# Patient Record
Sex: Male | Born: 1965 | Race: White | Hispanic: No | Smoking: Never smoker
Health system: Southern US, Community
[De-identification: ages and names within clinical notes are randomized; demographics above are authoritative.]

## PROBLEM LIST (undated history)

## (undated) DIAGNOSIS — D649 Anemia, unspecified: Secondary | ICD-10-CM

## (undated) DIAGNOSIS — G4700 Insomnia, unspecified: Secondary | ICD-10-CM

## (undated) DIAGNOSIS — I10 Essential (primary) hypertension: Secondary | ICD-10-CM

## (undated) DIAGNOSIS — R578 Other shock: Secondary | ICD-10-CM

## (undated) DIAGNOSIS — I2510 Atherosclerotic heart disease of native coronary artery without angina pectoris: Secondary | ICD-10-CM

## (undated) DIAGNOSIS — Z5189 Encounter for other specified aftercare: Secondary | ICD-10-CM

## (undated) DIAGNOSIS — K279 Peptic ulcer, site unspecified, unspecified as acute or chronic, without hemorrhage or perforation: Secondary | ICD-10-CM

## (undated) DIAGNOSIS — M4802 Spinal stenosis, cervical region: Secondary | ICD-10-CM

## (undated) DIAGNOSIS — K644 Residual hemorrhoidal skin tags: Secondary | ICD-10-CM

## (undated) DIAGNOSIS — K219 Gastro-esophageal reflux disease without esophagitis: Secondary | ICD-10-CM

## (undated) DIAGNOSIS — G4733 Obstructive sleep apnea (adult) (pediatric): Secondary | ICD-10-CM

## (undated) DIAGNOSIS — K2270 Barrett's esophagus without dysplasia: Secondary | ICD-10-CM

## (undated) DIAGNOSIS — G8929 Other chronic pain: Secondary | ICD-10-CM

## (undated) DIAGNOSIS — D539 Nutritional anemia, unspecified: Secondary | ICD-10-CM

## (undated) DIAGNOSIS — E785 Hyperlipidemia, unspecified: Secondary | ICD-10-CM

## (undated) DIAGNOSIS — G2581 Restless legs syndrome: Secondary | ICD-10-CM

## (undated) DIAGNOSIS — R0902 Hypoxemia: Secondary | ICD-10-CM

## (undated) DIAGNOSIS — J189 Pneumonia, unspecified organism: Secondary | ICD-10-CM

## (undated) DIAGNOSIS — K259 Gastric ulcer, unspecified as acute or chronic, without hemorrhage or perforation: Secondary | ICD-10-CM

## (undated) DIAGNOSIS — K5790 Diverticulosis of intestine, part unspecified, without perforation or abscess without bleeding: Secondary | ICD-10-CM

## (undated) DIAGNOSIS — K922 Gastrointestinal hemorrhage, unspecified: Secondary | ICD-10-CM

## (undated) DIAGNOSIS — R0683 Snoring: Secondary | ICD-10-CM

## (undated) DIAGNOSIS — M549 Dorsalgia, unspecified: Secondary | ICD-10-CM

## (undated) HISTORY — DX: Snoring: R06.83

## (undated) HISTORY — DX: Diverticulosis of intestine, part unspecified, without perforation or abscess without bleeding: K57.90

## (undated) HISTORY — DX: Hypoxemia: R09.02

## (undated) HISTORY — PX: CARDIAC CATHETERIZATION: SHX172

## (undated) HISTORY — DX: Anemia, unspecified: D64.9

## (undated) HISTORY — DX: Gastro-esophageal reflux disease without esophagitis: K21.9

## (undated) HISTORY — DX: Encounter for other specified aftercare: Z51.89

## (undated) HISTORY — DX: Residual hemorrhoidal skin tags: K64.4

## (undated) HISTORY — DX: Insomnia, unspecified: G47.00

## (undated) HISTORY — DX: Obstructive sleep apnea (adult) (pediatric): G47.33

## (undated) HISTORY — DX: Nutritional anemia, unspecified: D53.9

## (undated) HISTORY — DX: Hyperlipidemia, unspecified: E78.5

## (undated) HISTORY — DX: Restless legs syndrome: G25.81

---

## 1999-03-26 ENCOUNTER — Ambulatory Visit (HOSPITAL_COMMUNITY): Admission: RE | Admit: 1999-03-26 | Discharge: 1999-03-26 | Payer: Self-pay | Admitting: Orthopedic Surgery

## 1999-03-26 ENCOUNTER — Encounter: Payer: Self-pay | Admitting: Orthopedic Surgery

## 1999-04-09 ENCOUNTER — Ambulatory Visit (HOSPITAL_COMMUNITY): Admission: RE | Admit: 1999-04-09 | Discharge: 1999-04-09 | Payer: Self-pay | Admitting: Orthopedic Surgery

## 1999-04-09 ENCOUNTER — Encounter: Payer: Self-pay | Admitting: Orthopedic Surgery

## 1999-10-09 ENCOUNTER — Encounter: Payer: Self-pay | Admitting: Emergency Medicine

## 1999-10-09 ENCOUNTER — Emergency Department (HOSPITAL_COMMUNITY): Admission: EM | Admit: 1999-10-09 | Discharge: 1999-10-09 | Payer: Self-pay | Admitting: Emergency Medicine

## 2002-04-26 ENCOUNTER — Encounter: Payer: Self-pay | Admitting: Emergency Medicine

## 2002-04-26 ENCOUNTER — Inpatient Hospital Stay (HOSPITAL_COMMUNITY): Admission: EM | Admit: 2002-04-26 | Discharge: 2002-04-27 | Payer: Self-pay | Admitting: Emergency Medicine

## 2002-04-27 ENCOUNTER — Encounter: Payer: Self-pay | Admitting: Internal Medicine

## 2002-08-27 ENCOUNTER — Ambulatory Visit (HOSPITAL_COMMUNITY): Admission: RE | Admit: 2002-08-27 | Discharge: 2002-08-27 | Payer: Self-pay | Admitting: *Deleted

## 2003-01-05 ENCOUNTER — Ambulatory Visit (HOSPITAL_COMMUNITY): Admission: RE | Admit: 2003-01-05 | Discharge: 2003-01-06 | Payer: Self-pay | Admitting: Orthopaedic Surgery

## 2003-02-08 ENCOUNTER — Encounter: Admission: RE | Admit: 2003-02-08 | Discharge: 2003-03-22 | Payer: Self-pay | Admitting: Orthopaedic Surgery

## 2005-01-27 ENCOUNTER — Emergency Department (HOSPITAL_COMMUNITY): Admission: EM | Admit: 2005-01-27 | Discharge: 2005-01-27 | Payer: Self-pay | Admitting: Emergency Medicine

## 2005-09-20 ENCOUNTER — Emergency Department (HOSPITAL_COMMUNITY): Admission: EM | Admit: 2005-09-20 | Discharge: 2005-09-20 | Payer: Self-pay | Admitting: Emergency Medicine

## 2006-03-22 ENCOUNTER — Emergency Department (HOSPITAL_COMMUNITY): Admission: EM | Admit: 2006-03-22 | Discharge: 2006-03-22 | Payer: Self-pay | Admitting: Emergency Medicine

## 2006-03-30 ENCOUNTER — Emergency Department (HOSPITAL_COMMUNITY): Admission: EM | Admit: 2006-03-30 | Discharge: 2006-03-30 | Payer: Self-pay | Admitting: Emergency Medicine

## 2006-11-19 ENCOUNTER — Emergency Department (HOSPITAL_COMMUNITY): Admission: EM | Admit: 2006-11-19 | Discharge: 2006-11-19 | Payer: Self-pay | Admitting: Emergency Medicine

## 2006-12-06 ENCOUNTER — Emergency Department (HOSPITAL_COMMUNITY): Admission: EM | Admit: 2006-12-06 | Discharge: 2006-12-06 | Payer: Self-pay | Admitting: Emergency Medicine

## 2007-02-19 ENCOUNTER — Emergency Department (HOSPITAL_COMMUNITY): Admission: EM | Admit: 2007-02-19 | Discharge: 2007-02-19 | Payer: Self-pay | Admitting: Emergency Medicine

## 2007-02-21 ENCOUNTER — Emergency Department (HOSPITAL_COMMUNITY): Admission: EM | Admit: 2007-02-21 | Discharge: 2007-02-21 | Payer: Self-pay | Admitting: Emergency Medicine

## 2007-02-28 ENCOUNTER — Emergency Department (HOSPITAL_COMMUNITY): Admission: EM | Admit: 2007-02-28 | Discharge: 2007-02-28 | Payer: Self-pay | Admitting: Emergency Medicine

## 2008-01-17 ENCOUNTER — Emergency Department (HOSPITAL_COMMUNITY): Admission: EM | Admit: 2008-01-17 | Discharge: 2008-01-17 | Payer: Self-pay | Admitting: Emergency Medicine

## 2008-03-25 ENCOUNTER — Encounter: Admission: RE | Admit: 2008-03-25 | Discharge: 2008-03-25 | Payer: Self-pay | Admitting: Orthopedic Surgery

## 2008-04-06 ENCOUNTER — Encounter: Admission: RE | Admit: 2008-04-06 | Discharge: 2008-04-26 | Payer: Self-pay | Admitting: Orthopedic Surgery

## 2008-12-01 ENCOUNTER — Emergency Department (HOSPITAL_COMMUNITY): Admission: EM | Admit: 2008-12-01 | Discharge: 2008-12-01 | Payer: Self-pay | Admitting: Emergency Medicine

## 2009-05-19 ENCOUNTER — Encounter: Admission: RE | Admit: 2009-05-19 | Discharge: 2009-05-19 | Payer: Self-pay | Admitting: Family Medicine

## 2009-06-23 ENCOUNTER — Encounter: Admission: RE | Admit: 2009-06-23 | Discharge: 2009-06-23 | Payer: Self-pay | Admitting: Neurosurgery

## 2010-02-05 ENCOUNTER — Inpatient Hospital Stay (HOSPITAL_COMMUNITY): Admission: RE | Admit: 2010-02-05 | Discharge: 2010-02-07 | Payer: Self-pay | Admitting: Neurosurgery

## 2010-03-06 ENCOUNTER — Encounter: Admission: RE | Admit: 2010-03-06 | Discharge: 2010-03-06 | Payer: Self-pay | Admitting: Neurosurgery

## 2010-03-19 ENCOUNTER — Emergency Department (HOSPITAL_COMMUNITY): Admission: EM | Admit: 2010-03-19 | Discharge: 2010-03-19 | Payer: Self-pay | Admitting: Emergency Medicine

## 2010-06-12 ENCOUNTER — Encounter: Admission: RE | Admit: 2010-06-12 | Discharge: 2010-06-12 | Payer: Self-pay | Admitting: Neurosurgery

## 2010-07-18 ENCOUNTER — Emergency Department (HOSPITAL_COMMUNITY): Admission: EM | Admit: 2010-07-18 | Discharge: 2010-07-18 | Payer: Self-pay | Admitting: Emergency Medicine

## 2011-01-14 LAB — URINE CULTURE
Colony Count: NO GROWTH
Culture: NO GROWTH

## 2011-01-14 LAB — DIFFERENTIAL
Basophils Absolute: 0 10*3/uL (ref 0.0–0.1)
Basophils Relative: 1 % (ref 0–1)
Eosinophils Absolute: 0.1 10*3/uL (ref 0.0–0.7)
Eosinophils Relative: 3 % (ref 0–5)
Lymphocytes Relative: 31 % (ref 12–46)
Lymphs Abs: 1.2 10*3/uL (ref 0.7–4.0)
Monocytes Absolute: 0.2 10*3/uL (ref 0.1–1.0)
Monocytes Relative: 6 % (ref 3–12)
Neutro Abs: 2.4 10*3/uL (ref 1.7–7.7)
Neutrophils Relative %: 60 % (ref 43–77)

## 2011-01-14 LAB — CBC
HCT: 34.8 % — ABNORMAL LOW (ref 39.0–52.0)
Hemoglobin: 11.8 g/dL — ABNORMAL LOW (ref 13.0–17.0)
MCHC: 34 g/dL (ref 30.0–36.0)
MCV: 88.9 fL (ref 78.0–100.0)
Platelets: 176 10*3/uL (ref 150–400)
RBC: 3.91 MIL/uL — ABNORMAL LOW (ref 4.22–5.81)
RDW: 12.6 % (ref 11.5–15.5)
WBC: 4 10*3/uL (ref 4.0–10.5)

## 2011-01-14 LAB — BASIC METABOLIC PANEL
BUN: 16 mg/dL (ref 6–23)
CO2: 24 mEq/L (ref 19–32)
Calcium: 8.9 mg/dL (ref 8.4–10.5)
Chloride: 105 mEq/L (ref 96–112)
Creatinine, Ser: 0.84 mg/dL (ref 0.4–1.5)
GFR calc Af Amer: >60 mL/min (ref >60)
GFR calc non Af Amer: >60 mL/min (ref >60)
Glucose, Bld: 98 mg/dL (ref 70–99)
Potassium: 3.8 mEq/L (ref 3.5–5.1)
Sodium: 138 mEq/L (ref 135–145)

## 2011-01-14 LAB — URINALYSIS, ROUTINE W REFLEX MICROSCOPIC
Bilirubin Urine: NEGATIVE
Glucose, UA: NEGATIVE mg/dL
Hgb urine dipstick: NEGATIVE
Ketones, ur: NEGATIVE mg/dL
Nitrite: NEGATIVE
Protein, ur: NEGATIVE mg/dL
Specific Gravity, Urine: 1.017 (ref 1.005–1.030)
Urobilinogen, UA: 0.2 mg/dL (ref 0.0–1.0)
pH: 5.5 (ref 5.0–8.0)

## 2011-01-16 LAB — DIFFERENTIAL
Basophils Absolute: 0.1 10*3/uL (ref 0.0–0.1)
Basophils Relative: 2 % — ABNORMAL HIGH (ref 0–1)
Eosinophils Absolute: 0.1 10*3/uL (ref 0.0–0.7)
Eosinophils Relative: 2 % (ref 0–5)
Lymphocytes Relative: 40 % (ref 12–46)
Lymphs Abs: 1.3 10*3/uL (ref 0.7–4.0)
Monocytes Absolute: 0.2 10*3/uL (ref 0.1–1.0)
Monocytes Relative: 7 % (ref 3–12)
Neutro Abs: 1.5 10*3/uL — ABNORMAL LOW (ref 1.7–7.7)
Neutrophils Relative %: 49 % (ref 43–77)

## 2011-01-16 LAB — BASIC METABOLIC PANEL
BUN: 15 mg/dL (ref 6–23)
CO2: 28 mEq/L (ref 19–32)
Calcium: 9.8 mg/dL (ref 8.4–10.5)
Chloride: 102 mEq/L (ref 96–112)
Creatinine, Ser: 0.94 mg/dL (ref 0.4–1.5)
GFR calc Af Amer: >60 mL/min (ref >60)
GFR calc non Af Amer: >60 mL/min (ref >60)
Glucose, Bld: 112 mg/dL — ABNORMAL HIGH (ref 70–99)
Potassium: 4.6 mEq/L (ref 3.5–5.1)
Sodium: 136 mEq/L (ref 135–145)

## 2011-01-16 LAB — ABO/RH: ABO/RH(D): O POS

## 2011-01-16 LAB — SURGICAL PCR SCREEN
MRSA, PCR: NEGATIVE
Staphylococcus aureus: NEGATIVE

## 2011-01-16 LAB — CBC
HCT: 45.5 % (ref 39.0–52.0)
Hemoglobin: 15.8 g/dL (ref 13.0–17.0)
MCHC: 34.7 g/dL (ref 30.0–36.0)
MCV: 90.1 fL (ref 78.0–100.0)
Platelets: 147 10*3/uL — ABNORMAL LOW (ref 150–400)
RBC: 5.05 MIL/uL (ref 4.22–5.81)
RDW: 12.2 % (ref 11.5–15.5)
WBC: 3.1 10*3/uL — ABNORMAL LOW (ref 4.0–10.5)

## 2011-01-16 LAB — TYPE AND SCREEN
ABO/RH(D): O POS
Antibody Screen: NEGATIVE

## 2011-03-15 NOTE — Op Note (Signed)
NAME:  Joseph Fisher, Joseph Fisher                         ACCOUNT NO.:  [id number]   MEDICAL RECORD NO.:  [id number]                   PATIENT TYPE:  OIB   LOCATION:  3003                                 FACILITY:  [facility]   PHYSICIAN:  Mark C. Ophelia Charter, M.D.                 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   OPERATION/PROCEDURE:  L4-5 laminotomy, foraminotomy and microdiskectomy with  removal of free fragment, right foraminotomy and microdiskectomy.   ASSISTANT:  Genene Churn. Barry Dienes, P.A.-C.   ANESTHESIA:  COT.   COMPLICATIONS:  None.   DESCRIPTION OF PROCEDURE:  After the induction of general anesthesia with  preoperative Ancef prophylaxis, the patient was placed in the kneeling  position on the [place] frame with careful padding and positioning.  The  back was prepped with Duraprep, scrubbed with towels.  A sterile skin mark  was used at the old incision.  Betadine Vi-drape was applied.  The old  incision was opened and spinal needle was placed at the L4-5 disk space  level, confirmed by the cross table lateral x-ray.   The inferior aspect of the lamina was identified and a small cuff curet was  used to remove the soft tissue off the inferior aspect of the lamina and  partial laminectomy was performed on the left side, leaving a small portion  of the lamina intact at only the upper one-third.  This got up to a normal  level of ligament that had not been exposed at the first procedure several  years ago.  The foramina was enlarged and there was extensive scarring of  the dura in the lateral gutter and of the nerve root as it entered the  foramina.  Attempts at mobilizing the nerve  shows that it was extremely  difficult and initial mobilization was performed at the superior aspect once  the ligament was removed and gently peeled off.  A dural separator was used  anterior to the dura and nerve root with gentle microdissection using the  operating microscope.  The dura was full at the level of the disk where the  patient had a large free fragment and there was extensive scarring  consistent with probably a previous rupture and then more recent rerupture  and increase in size with added compression.  The patient had spinal  stenosis by MRI measurements due to the large size of the fragment.  With  difficulty mobilizing and reaching the fragment, the inferior aspect of the  spinous process was removed and the ligaments were removed at the midline  and over on the right side.  The bone was removed out  to the level of the  pedicle to allow additional room and there were some hypertrophic ligament  in the right gutter as well and the foramina was enlarged.  The level of the  disk was identified on the right level and the disk was collapsed,  corresponding with the x-ray which showed only a 1 to 2 mm disk space.  This  was in line with the patient's large disk herniation and significant disk  disease.  With mobilization of the right side, the microscope was then re-  orientated back on the left again and now the dura was able to be gently  mobilized and several large fragments of disk were caught with the  micropituitary, gently teased out and separated from the dura.  A hockey  stick was placed on the right side and some free fragments were pushed over  to the left side and the fragments were removed.  Once all fragments were  removed, the hockey stick could be passed through 180 degree sweep from the  right as well as from the left and using the Derrico retractor on the  opposite side, the hockey stick was able to be visualized and there was no  compression anterior to the  dura and the hockey stick would made an easy 180  degree sweep.  The patient did have some mild endplate spurring at the L4-5  level but there were no fragments and the portion of the endplate spurring  was nibbled off from the right and left.  Facets were intact.  The foramina  were enlarged and after irrigation with saline solution, several passes were  made with the micropituitary from the right and left side with essentially  no disk nucleus material removed.  The defect in the annulus was visualized  on the left paracentral region where the disk had herniated.  Up and down  micropituitaries were used but essentially there was minimal disk material  left and after irrigation with saline solution, the fascia was closed with 0  Vicryl, 2-0 Vicryl in the subcutaneous tissue, skin staple closure,  postoperative dressing.  Postoperative exam in the recovery room after  extubation demonstrated he had good relief of his preoperative leg pain,  return of sensation and normal motor exam.  The patient did have complaints  of back incisional pain as expected.  The instrument count and needle count  was correct.                                                Mark C. Ophelia Charter, M.D.    MCY/MEDQ  D:  01/05/2003  T:  01/06/2003  Job:  [id number]

## 2011-03-15 NOTE — Discharge Summary (Signed)
[place]. [facility]  Patient:    Joseph Fisher, Joseph Fisher Visit Number: [id number] MRN: [id number]          Service Type: MED Location: 1800 [id number] [id number] Attending Physician:  Ilene Qua Dictated by:   Dietrich Pates, M.D., [facility], [facility] Admit Date:  04/26/2002                             Discharge Summary  IDENTIFICATION:  The patient is a 45 year old with no history of CAD.  Over the past few weeks, he has noted episodic chest pressure.  He describes it as pain substernally and radiates other places across his chest, accompanied by some shortness of breath, not associated with any activity.  He actually says he will walk about a mile and one-half a day for exercise and notes no problem with this, maybe slight increase in shortness of breath.  He notes some occasional tight with food by not consistent.  Today, he woke up, had chest pressure, became nauseated, vomited.  Some improvement but still had chest pressure.  He came to the emergency room, and about 30 minutes after arriving said symptoms improved.  No intervention.  The patient denies any change in his bowel habits.  No black tarry stools.  No hematemesis.  Does note burning acid taste in his mouth with awakening at times, did have that this morning.  MEDICATIONS:  Advil and Pepcid.  ALLERGIES:  None.  PAST MEDICAL HISTORY:  Negative.  PAST SURGICAL HISTORY:  Status post back surgery.  SOCIAL HISTORY:  The patient is married.  He is a [employer].  He does not smoke, does not drink for past seven months.  FAMILY HISTORY:  Father died of an MI at age 75.  REVIEW OF SYSTEMS:  No fevers or chills.  Cardiac: As noted.  GI: As noted. GU: Negative.  Endocrine: No history of diabetes.  Lipids unknown.  PHYSICAL EXAMINATION:  GENERAL:  Patient in no acute distress.  VITAL SIGNS:  Blood pressure 145/90, pulse 80.  Weight not taken.  Temperature 98.  NECK:  JVP is normal, no bruits.  LUNGS:   Clear.  CARDIAC:  Regular rate and rhythm.  Normal S1 and S2.  No S3, S4, or murmur.  CHEST:  Nontender.  ABDOMEN:  Benign.  EXTREMITIES:  There are 2+ pulses.  No lower extremity edema.  DIAGNOSTIC DATA:  A 12-lead EKG shows normal sinus rhythm at rate of 68 beats per minute.  CK 199, MB 6.1, troponin 0.01.  IMPRESSION:  The patient is a 45 year old gentleman with chest pain which appears to be atypical for cardiac.  Still, he notes some shortness of breath.  The pain was worse this morning but unlikely.  Would go ahead and rule out. Treat with proton pump inhibitor.  Check lipids.  Plan, if he rules out, for stress Cardiolite in the morning. Dictated by:   Dietrich Pates, M.D., [facility], [facility] Attending Physician:  Ilene Qua DD:  04/26/02 TD:  04/26/02 Job: [id number] XL/[id number]

## 2011-03-15 NOTE — Op Note (Signed)
NAME:  Joseph Fisher, Joseph Fisher                         ACCOUNT NO.:  [id number]   MEDICAL RECORD NO.:  [id number]                   PATIENT TYPE:  AMB   LOCATION:  ENDO                                 FACILITY:  [facility]   PHYSICIAN:  Sharyn Dross., M.D.               DATE OF BIRTH:  02/25/1966   DATE OF PROCEDURE:  DATE OF DISCHARGE:                                 OPERATIVE REPORT   REFERRING PHYSICIAN:  Dortha Kern, M.D.   ENDOSCOPIST:  Dortha Kern, M.D.   INSTRUMENT USED:  Olympus video panendoscope.   SUBJECTIVE:  This 45 year old gentleman has been known to me for the last  month.  He has been relatively stable and evaluated for gastroesophageal  reflux disease possibly secondary to NSAID use.  He recently returned back  to the office where he was noted to have been experiencing blood per rectum  that was ongoing.  There was no associated pain or discomforts with process  that was present.  He denies any history of abdominal pains, nor any history  of any inflammatory bowel disease that was present.  He states that the  occurrence of blood has been noted twice up until his last visit  approximately a week ago.  He came in to the office for evaluation of this.   OBJECTIVE FINDINGS:  The patient is a pleasant gentleman who appears to be  in no distress.  The vital signs appear to be stable.  The HEENT examination  was anicteric.  The neck was supple.  Lungs were clear to auscultation and  percussion.  The heart had a regular rate and rhythm without heaves,  thrills, murmurs or gallops.  The abdomen was soft.  There was no evidence  of tenderness or any hepatosplenomegaly that was noted.  Digital rectal exam  was normal.  The extremities showed no cyanosis, clubbing or edema.   GROSS IMPRESSION:  Blood in stools,  rule out possibly related to  hemorrhoids, rule out inflammatory bowel disease, rule out other causes at  this time.   PRESENT RECOMMENDATIONS:  Presently, I am going to  proceed with an  colonoscopic examination to evaluate this process that is ongoing with him  at this time, depending on these results will determine the course of  therapy.   INFORMED CONSENT:  The patient was advised of the procedures, the  indications and the risks involved.  He has viewed the video at this time.  A consent form was obtained.   PREOPERATIVE PREPARATION:  The patient was brought into the endoscopy unit  at [facility] where the IV for intravenous sedating medication was  started.  A monitor was placed on the patient to monitor the patient's vital  signs and oxygen saturations.  Nasal oxygen at approximately 2L per minute  was used and after adequate sedation was performed, the procedure was begun.   DESCRIPTION OF PROCEDURE:  The  instrument was advanced with the patient  lying in the left lateral position via direct technique without difficulty  to approximately 90 cm proximal colon to the cecum.  This is confirmed by  palpation, transillumination as well as visualization of the ileocecal  valve.   There appeared to be no gross abnormalities such as masses, polyps, or  stricture lesions appreciated.  The vascular pattern appeared to be well  within normal limits throughout the entire colon.   However, the mucosal pattern showed evidence of scattered diverticula that  was present in the descending colon.  The diverticula appeared to be small  to moderate in size without any surrounding inflammation or bleeding noted.  There was no increased tortuosity of the colon that was noted.   The instrument was retroflexed in the rectal area for visualization of the  anal verge.  Again there was no evidence of any inflammatory changes or  internal hemorrhoids that was appreciated.  The instrument was gradually  straightened out and retracted back.  This area upon exiting from the area  showed evidence of scattered, slightly increased vascularity of the external  anal  verge at this time.  There was no evidence of any active bleeding that  was noted.  The instrument was removed per rectum from this.  The patient  tolerated the procedure well.   TREATMENT:  I am going to recommend ProctoCream with hydrocortisone to be  applied to the anus at this time q.h.s.  He is to follow up with me in the  next two to three weeks for re-evaluation depending on these results will  determine the course of therapy.                                               Sharyn Dross., M.D.    JM/MEDQ  D:  08/27/2002  T:  08/27/2002  Job:  [id number]

## 2011-08-30 ENCOUNTER — Emergency Department (HOSPITAL_COMMUNITY): Payer: Self-pay

## 2011-08-30 ENCOUNTER — Emergency Department (HOSPITAL_COMMUNITY)
Admission: EM | Admit: 2011-08-30 | Discharge: 2011-08-31 | Disposition: A | Discharge status: Home / Self Care | Payer: Self-pay | Attending: Emergency Medicine | Admitting: Emergency Medicine

## 2011-08-30 DIAGNOSIS — I10 Essential (primary) hypertension: Secondary | ICD-10-CM | POA: Insufficient documentation

## 2011-08-30 DIAGNOSIS — J069 Acute upper respiratory infection, unspecified: Secondary | ICD-10-CM | POA: Insufficient documentation

## 2011-08-30 DIAGNOSIS — R509 Fever, unspecified: Secondary | ICD-10-CM | POA: Insufficient documentation

## 2011-08-30 DIAGNOSIS — R05 Cough: Secondary | ICD-10-CM | POA: Insufficient documentation

## 2011-08-30 DIAGNOSIS — J3489 Other specified disorders of nose and nasal sinuses: Secondary | ICD-10-CM | POA: Insufficient documentation

## 2011-08-30 DIAGNOSIS — R269 Unspecified abnormalities of gait and mobility: Secondary | ICD-10-CM | POA: Insufficient documentation

## 2011-08-30 DIAGNOSIS — R0989 Other specified symptoms and signs involving the circulatory and respiratory systems: Secondary | ICD-10-CM | POA: Insufficient documentation

## 2011-08-30 DIAGNOSIS — R42 Dizziness and giddiness: Secondary | ICD-10-CM | POA: Insufficient documentation

## 2011-08-30 DIAGNOSIS — R0609 Other forms of dyspnea: Secondary | ICD-10-CM | POA: Insufficient documentation

## 2011-08-30 DIAGNOSIS — R059 Cough, unspecified: Secondary | ICD-10-CM | POA: Insufficient documentation

## 2011-08-30 LAB — DIFFERENTIAL
Basophils Absolute: 0 10*3/uL (ref 0.0–0.1)
Basophils Relative: 0 % (ref 0–1)
Eosinophils Absolute: 0.1 10*3/uL (ref 0.0–0.7)
Eosinophils Relative: 2 % (ref 0–5)
Lymphocytes Relative: 32 % (ref 12–46)
Lymphs Abs: 1.6 10*3/uL (ref 0.7–4.0)
Monocytes Absolute: 0.3 10*3/uL (ref 0.1–1.0)
Monocytes Relative: 5 % (ref 3–12)
Neutro Abs: 3 10*3/uL (ref 1.7–7.7)
Neutrophils Relative %: 60 % (ref 43–77)

## 2011-08-30 LAB — CBC
HCT: 41.8 % (ref 39.0–52.0)
Hemoglobin: 14.1 g/dL (ref 13.0–17.0)
MCH: 30.5 pg (ref 26.0–34.0)
MCHC: 33.7 g/dL (ref 30.0–36.0)
MCV: 90.5 fL (ref 78.0–100.0)
Platelets: 153 10*3/uL (ref 150–400)
RBC: 4.62 MIL/uL (ref 4.22–5.81)
RDW: 12.7 % (ref 11.5–15.5)
WBC: 5 10*3/uL (ref 4.0–10.5)

## 2011-08-30 LAB — POCT I-STAT, CHEM 8
BUN: 12 mg/dL (ref 6–23)
Calcium, Ion: 1.16 mmol/L (ref 1.12–1.32)
Chloride: 105 mEq/L (ref 96–112)
Creatinine, Ser: 1.3 mg/dL (ref 0.50–1.35)
Glucose, Bld: 141 mg/dL — ABNORMAL HIGH (ref 70–99)
HCT: 43 % (ref 39.0–52.0)
Hemoglobin: 14.6 g/dL (ref 13.0–17.0)
Potassium: 3.2 mEq/L — ABNORMAL LOW (ref 3.5–5.1)
Sodium: 139 mEq/L (ref 135–145)
TCO2: 23 mmol/L (ref 0–100)

## 2011-08-30 MED ORDER — GADOBENATE DIMEGLUMINE 529 MG/ML IV SOLN
20.0000 mL | Freq: Once | INTRAVENOUS | Status: AC | PRN
  Administered 2011-08-30: 20 mL via INTRAVENOUS

## 2011-08-31 MED ORDER — IOHEXOL 350 MG/ML SOLN
80.0000 mL | Freq: Once | INTRAVENOUS | Status: AC | PRN
  Administered 2011-08-31: 80 mL via INTRAVENOUS

## 2011-09-14 ENCOUNTER — Encounter: Payer: Self-pay | Admitting: Emergency Medicine

## 2011-09-14 ENCOUNTER — Emergency Department (HOSPITAL_COMMUNITY)
Admission: EM | Admit: 2011-09-14 | Discharge: 2011-09-14 | Discharge status: Against Medical Advice | Payer: Self-pay | Attending: Emergency Medicine | Admitting: Emergency Medicine

## 2011-09-14 DIAGNOSIS — R079 Chest pain, unspecified: Secondary | ICD-10-CM | POA: Insufficient documentation

## 2011-09-14 DIAGNOSIS — R05 Cough: Secondary | ICD-10-CM

## 2011-09-14 DIAGNOSIS — R109 Unspecified abdominal pain: Secondary | ICD-10-CM | POA: Insufficient documentation

## 2011-09-14 HISTORY — DX: Essential (primary) hypertension: I10

## 2011-09-14 NOTE — ED Notes (Addendum)
Pt presented to the Er with c/o right side and and rib cage pain, 8/10, started about 3-4 weeks ago, pt states he was seen in th ER for the same symptoms, pt given steroids and cough syrup, however reports no relief in symptoms. Pain constant, sharp, increase upon movement, at times feels like a knife stabbing. Pt also states that when palpated to the right area, pain noted as well.Pt also seen by his PCP.

## 2011-09-17 ENCOUNTER — Other Ambulatory Visit: Payer: Self-pay

## 2011-09-17 ENCOUNTER — Emergency Department (HOSPITAL_COMMUNITY): Payer: Self-pay

## 2011-09-17 ENCOUNTER — Encounter (HOSPITAL_COMMUNITY): Payer: Self-pay | Admitting: *Deleted

## 2011-09-17 ENCOUNTER — Emergency Department (HOSPITAL_COMMUNITY)
Admission: EM | Admit: 2011-09-17 | Discharge: 2011-09-17 | Disposition: A | Discharge status: Home / Self Care | Payer: Self-pay | Attending: Emergency Medicine | Admitting: Emergency Medicine

## 2011-09-17 DIAGNOSIS — R05 Cough: Secondary | ICD-10-CM | POA: Insufficient documentation

## 2011-09-17 DIAGNOSIS — R059 Cough, unspecified: Secondary | ICD-10-CM | POA: Insufficient documentation

## 2011-09-17 DIAGNOSIS — J40 Bronchitis, not specified as acute or chronic: Secondary | ICD-10-CM | POA: Insufficient documentation

## 2011-09-17 DIAGNOSIS — R079 Chest pain, unspecified: Secondary | ICD-10-CM | POA: Insufficient documentation

## 2011-09-17 DIAGNOSIS — I10 Essential (primary) hypertension: Secondary | ICD-10-CM | POA: Insufficient documentation

## 2011-09-17 MED ORDER — AZITHROMYCIN 250 MG PO TABS
500.0000 mg | ORAL_TABLET | Freq: Once | ORAL | Status: AC
  Administered 2011-09-17: 500 mg via ORAL
  Filled 2011-09-17: qty 2

## 2011-09-17 MED ORDER — ALBUTEROL SULFATE HFA 108 (90 BASE) MCG/ACT IN AERS
2.0000 | INHALATION_SPRAY | RESPIRATORY_TRACT | Status: DC | PRN
  Administered 2011-09-17: 2 via RESPIRATORY_TRACT
  Filled 2011-09-17: qty 6.7

## 2011-09-17 MED ORDER — AZITHROMYCIN 250 MG PO TABS: 250.0000 mg | ORAL_TABLET | Freq: Every day | ORAL | Status: AC

## 2011-09-17 NOTE — ED Notes (Signed)
Pt reports R rib cage pain and L chest pain, chest congestion with associated intermittent shob. Has been evaluated multiple times for same complaint, most recently on 11/17. No relief with steroids, cough syrup.

## 2011-09-17 NOTE — ED Provider Notes (Signed)
History     CSN: [id number] Arrival date & time: 09/17/2011  9:18 AM   First MD Initiated Contact with Patient 09/17/11 [phone]      Chief Complaint  Patient presents with  . Chest Pain  . Nasal Congestion    (Consider location/radiation/quality/duration/timing/severity/associated sxs/prior treatment) HPI Complains of cough and right lateral chest pain with cough only for 3-4 weeks. Denies shortness of breath denies nasal congestion denies fever no other associated symptoms. Pain is sharp at right lateral chest only with coughing. Pain does not radiate, severe with cough only. Treated with cough medicine without relief. Seen here 08/30/2011 had chest x-ray which was negative. Also had CT angio of neck 11 /11/2010 suspicious for upper lobe pneumonia. Patient also seen by primary care physician Dr.Wolters, a few days ago and feels that his cough continues to worsen Past Medical History  Diagnosis Date  . Hypertension     Past Surgical History  Procedure Date  . Back surgery     No family history on file.  History  Substance Use Topics  . Smoking status: Never Smoker   . Smokeless tobacco: Not on file  . Alcohol Use: Yes      Review of Systems  Constitutional: Negative.   HENT: Negative.   Respiratory: Positive for cough.        Pleuritic chest pain  Cardiovascular: Negative.   Gastrointestinal: Negative.   Musculoskeletal: Negative.   Skin: Negative.   Neurological: Negative.   Hematological: Negative.   Psychiatric/Behavioral: Negative.   All other systems reviewed and are negative.    Allergies  Review of patient's allergies indicates no known allergies.  Home Medications   Current Outpatient Rx  Name Route Sig Dispense Refill  . ATENOLOL 25 MG PO TABS Oral Take 25 mg by mouth daily.      . OXYCODONE-ACETAMINOPHEN 10-325 MG PO TABS Oral Take 1 tablet by mouth every 4 (four) hours as needed. Pain.       BP 144/112  Pulse 91  Temp(Src) 98.5 F (36.9 C)  (Oral)  Resp 16  Ht 5\' 6"  (1.676 m)  Wt 205 lb (92.987 kg)  BMI 33.09 kg/m2  SpO2 97%  Physical Exam  Nursing note and vitals reviewed. Abdominal:       obese    ED Course  Procedures (including critical care time)  Labs Reviewed - No data to display No results found.   No diagnosis found. Date: 09/17/2011  Rate: 80  Rhythm: normal sinus rhythm  QRS Axis: normal  Intervals: normal  ST/T Wave abnormalities: nonspecific T wave changes  Conduction Disutrbances:none  Narrative Interpretation:   Old EKG Reviewed: unchanged Unchanged from 01/30/2010    MDM  In light of patient's having cough for several weeks will treat with antibiotic. Plan prescription Zithromax, albuterol inhaler to go 2 puffs every 4 hours when necessary cough. Followup Dr.Wolters if not improved in a week. bp recheck 1 week   Diagnosis #1Bronchitis #2 hypertenstion     Doug Sou, MD 09/17/11 1116

## 2011-09-17 NOTE — ED Notes (Signed)
No resp distress noted.

## 2011-09-23 NOTE — ED Provider Notes (Signed)
History     CSN: [id number] Arrival date & time: 09/14/2011  8:52 PM   First MD Initiated Contact with Patient 09/14/11 2218      Chief Complaint  Patient presents with  . Abdominal Pain     and right rib cage pane    (Consider location/radiation/quality/duration/timing/severity/associated sxs/prior treatment) HPI This patient presented w weeks of cough and R -sided pain.  He left AMA prior to PA / MD eval. Past Medical History  Diagnosis Date  . Hypertension     Past Surgical History  Procedure Date  . Back surgery     History reviewed. No pertinent family history.  History  Substance Use Topics  . Smoking status: Never Smoker   . Smokeless tobacco: Not on file  . Alcohol Use: Yes      Review of Systems Patient left AMA Allergies  Review of patient's allergies indicates no known allergies.  Home Medications   Current Outpatient Rx  Name Route Sig Dispense Refill  . ATENOLOL 25 MG PO TABS Oral Take 25 mg by mouth daily.      . OXYCODONE-ACETAMINOPHEN 10-325 MG PO TABS Oral Take 1 tablet by mouth every 4 (four) hours as needed. Pain.     Marlin Canary HEADACHE PO Oral Take 1 packet by mouth 2 (two) times daily as needed. For headache     . GUAIFENESIN-DM 100-10 MG/5ML PO SYRP Oral Take 5 mLs by mouth 4 (four) times daily as needed. For chest congestion       BP 147/108  Pulse 73  Temp(Src) 98.1 F (36.7 C) (Oral)  Resp 20  Wt 215 lb (97.523 kg)  SpO2 97%  Physical Exam Patient left AMA ED Course  Procedures (including critical care time)  Labs Reviewed - No data to display No results found.   No diagnosis found.    MDM  Patient left AMA prior to my evaluation.        Gerhard Munch, MD 09/23/11 [phone]

## 2011-10-29 HISTORY — PX: BACK SURGERY: SHX140

## 2012-07-23 ENCOUNTER — Emergency Department (HOSPITAL_COMMUNITY): Payer: Self-pay

## 2012-07-23 ENCOUNTER — Emergency Department (HOSPITAL_COMMUNITY)
Admission: EM | Admit: 2012-07-23 | Discharge: 2012-07-23 | Disposition: A | Discharge status: Home / Self Care | Payer: Self-pay | Attending: Emergency Medicine | Admitting: Emergency Medicine

## 2012-07-23 DIAGNOSIS — I10 Essential (primary) hypertension: Secondary | ICD-10-CM | POA: Insufficient documentation

## 2012-07-23 DIAGNOSIS — S0292XA Unspecified fracture of facial bones, initial encounter for closed fracture: Secondary | ICD-10-CM

## 2012-07-23 DIAGNOSIS — R220 Localized swelling, mass and lump, head: Secondary | ICD-10-CM | POA: Insufficient documentation

## 2012-07-23 DIAGNOSIS — S022XXA Fracture of nasal bones, initial encounter for closed fracture: Secondary | ICD-10-CM | POA: Insufficient documentation

## 2012-07-23 MED ORDER — CEPHALEXIN 250 MG PO CAPS
250.0000 mg | ORAL_CAPSULE | Freq: Once | ORAL | Status: AC
  Administered 2012-07-23: 250 mg via ORAL
  Filled 2012-07-23: qty 1

## 2012-07-23 MED ORDER — CEPHALEXIN 250 MG PO CAPS: 250.0000 mg | ORAL_CAPSULE | Freq: Three times a day (TID) | ORAL | Status: DC

## 2012-07-23 MED ORDER — OXYCODONE-ACETAMINOPHEN 5-325 MG PO TABS: 1.0000 | ORAL_TABLET | Freq: Four times a day (QID) | ORAL | Status: DC | PRN

## 2012-07-23 NOTE — ED Provider Notes (Signed)
Medical screening examination/treatment/procedure(s) were performed by non-physician practitioner and as supervising physician I was immediately available for consultation/collaboration. Devoria Albe, MD, [facility]   Ward Givens, MD 07/23/12 2233
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
## 2012-07-23 NOTE — ED Notes (Signed)
Patient transported to X-ray 

## 2012-08-20 ENCOUNTER — Emergency Department (HOSPITAL_COMMUNITY): Payer: Self-pay

## 2012-08-20 ENCOUNTER — Encounter (HOSPITAL_COMMUNITY): Payer: Self-pay | Admitting: Emergency Medicine

## 2012-08-20 ENCOUNTER — Emergency Department (HOSPITAL_COMMUNITY)
Admission: EM | Admit: 2012-08-20 | Discharge: 2012-08-20 | Disposition: A | Discharge status: Home / Self Care | Payer: Self-pay | Attending: Emergency Medicine | Admitting: Emergency Medicine

## 2012-08-20 DIAGNOSIS — Z792 Long term (current) use of antibiotics: Secondary | ICD-10-CM | POA: Insufficient documentation

## 2012-08-20 DIAGNOSIS — Z79899 Other long term (current) drug therapy: Secondary | ICD-10-CM | POA: Insufficient documentation

## 2012-08-20 DIAGNOSIS — J40 Bronchitis, not specified as acute or chronic: Secondary | ICD-10-CM | POA: Insufficient documentation

## 2012-08-20 DIAGNOSIS — I10 Essential (primary) hypertension: Secondary | ICD-10-CM | POA: Insufficient documentation

## 2012-08-20 MED ORDER — ACETAMINOPHEN 500 MG PO TABS
1000.0000 mg | ORAL_TABLET | Freq: Once | ORAL | Status: AC
  Administered 2012-08-20: 1000 mg via ORAL
  Filled 2012-08-20: qty 2

## 2012-08-20 MED ORDER — GUAIFENESIN 100 MG/5ML PO LIQD: 100.0000 mg | ORAL | Status: DC | PRN

## 2012-08-20 MED ORDER — AZITHROMYCIN 250 MG PO TABS: 250.0000 mg | ORAL_TABLET | Freq: Every day | ORAL | Status: DC

## 2012-08-20 MED ORDER — OXYCODONE-ACETAMINOPHEN 5-325 MG PO TABS: 2.0000 | ORAL_TABLET | ORAL | Status: DC | PRN

## 2012-08-20 MED ORDER — ALBUTEROL SULFATE (5 MG/ML) 0.5% IN NEBU
2.5000 mg | INHALATION_SOLUTION | Freq: Once | RESPIRATORY_TRACT | Status: AC
  Administered 2012-08-20: 2.5 mg via RESPIRATORY_TRACT
  Filled 2012-08-20: qty 0.5

## 2012-08-20 MED ORDER — ALBUTEROL SULFATE HFA 108 (90 BASE) MCG/ACT IN AERS: 1.0000 | INHALATION_SPRAY | Freq: Four times a day (QID) | RESPIRATORY_TRACT | Status: DC | PRN

## 2012-08-20 NOTE — ED Notes (Signed)
Pt c/o productive cough for several weeks. ?

## 2012-08-20 NOTE — ED Provider Notes (Signed)
History     CSN: [id number]  Arrival date & time 08/20/12  1201   First MD Initiated Contact with Patient 08/20/12 1313      Chief Complaint  Patient presents with  . Cough    (Consider location/radiation/quality/duration/timing/severity/associated sxs/prior treatment) HPI Comments: Patient is a 46 year old male with a 3 week history of productive cough. The cough has been constant for the past 3 weeks with green/clear phlegm. Patient has not tried anything for symptoms relief. No aggravating/alleviating factors. Patient has no history of asthma, COPD, or smoking. Patient reports associated chest pain that started gradually over the past couple days, located to his right chest, made worse with coughing, and tender to palpation. He also reports occasional SOB. Patient denies fever, headache, NVD, sore throat, abdominal pain. No known sick contacts.    Past Medical History  Diagnosis Date  . Hypertension     Past Surgical History  Procedure Date  . Back surgery     History reviewed. No pertinent family history.  History  Substance Use Topics  . Smoking status: Never Smoker   . Smokeless tobacco: Not on file  . Alcohol Use: Yes      Review of Systems  Respiratory: Positive for cough and shortness of breath.   Cardiovascular: Positive for chest pain.  All other systems reviewed and are negative.    Allergies  Review of patient's allergies indicates no known allergies.  Home Medications   Current Outpatient Rx  Name Route Sig Dispense Refill  . ATENOLOL 25 MG PO TABS Oral Take 25 mg by mouth daily.      . IBUPROFEN 200 MG PO TABS Oral Take 800 mg by mouth every 6 (six) hours as needed. pain    . OXYCODONE-ACETAMINOPHEN 10-325 MG PO TABS Oral Take 1 tablet by mouth every 4 (four) hours as needed. Pain.     . CEPHALEXIN 250 MG PO CAPS Oral Take 1 capsule (250 mg total) by mouth 3 (three) times daily. 30 capsule 0    BP 129/89  Pulse 66  Temp 98.3 F (36.8 C)  (Oral)  Resp 18  SpO2 98%  Physical Exam  Nursing note and vitals reviewed. Constitutional: He is oriented to person, place, and time. He appears well-developed and well-nourished. No distress.  HENT:  Head: Normocephalic and atraumatic.  Right Ear: External ear normal.  Left Ear: External ear normal.  Mouth/Throat: Oropharynx is clear and moist. No oropharyngeal exudate.  Eyes: Conjunctivae normal and EOM are normal. No scleral icterus.  Neck: Normal range of motion. Neck supple.  Cardiovascular: Normal rate and regular rhythm.  Exam reveals no gallop and no friction rub.   No murmur heard. Pulmonary/Chest: Effort normal. He has wheezes. He has no rales. He exhibits no tenderness.       Occasional wheezes noted throughout bilateral lung fields.   Abdominal: Soft. He exhibits no distension.  Musculoskeletal: Normal range of motion.  Lymphadenopathy:    He has no cervical adenopathy.  Neurological: He is alert and oriented to person, place, and time. Coordination normal.       Speech is goal-oriented. Moves limbs without ataxia.   Skin: Skin is warm and dry. He is not diaphoretic.  Psychiatric: He has a normal mood and affect. His behavior is normal.    ED Course  Procedures (including critical care time)  Labs Reviewed - No data to display Dg Chest 2 View  08/20/2012  *RADIOLOGY REPORT*  Clinical Data: Productive cough, chest pain,  shortness of breath  CHEST - 2 VIEW  Comparison: Chest x-ray of 09/17/2011  Findings: The lungs are clear.  Mediastinal contours appear stable. The heart is within upper limits of normal.  No bony abnormality is seen.  IMPRESSION: No active lung disease.   Original Report Authenticated By: Juline Patch, M.D.      1. Bronchitis       MDM  1:44 PM Patient likely has bronchitis based on history and exam. Chest xray negative. Patient will have nebulizer treatment here and be discharged with antibiotics, inhaler, and tussinex.   2:28 PM Patient  feels a little better after nebulizer treatment but still in pain. I will give him a dose of acetaminophen before he leaves. No further evaluation needed.        Emilia Beck, [place] 08/21/12 [phone]

## 2012-08-22 NOTE — ED Provider Notes (Signed)
Medical screening examination/treatment/procedure(s) were performed by non-physician practitioner and as supervising physician I was immediately available for consultation/collaboration.   Mikailah Morel, MD 08/22/12 0710 

## 2012-08-28 ENCOUNTER — Emergency Department (HOSPITAL_COMMUNITY)
Admission: EM | Admit: 2012-08-28 | Discharge: 2012-08-28 | Disposition: A | Discharge status: Home / Self Care | Payer: Self-pay | Attending: Emergency Medicine | Admitting: Emergency Medicine

## 2012-08-28 ENCOUNTER — Emergency Department (HOSPITAL_COMMUNITY): Payer: Self-pay

## 2012-08-28 ENCOUNTER — Encounter (HOSPITAL_COMMUNITY): Payer: Self-pay | Admitting: *Deleted

## 2012-08-28 DIAGNOSIS — J40 Bronchitis, not specified as acute or chronic: Secondary | ICD-10-CM | POA: Insufficient documentation

## 2012-08-28 DIAGNOSIS — I10 Essential (primary) hypertension: Secondary | ICD-10-CM | POA: Insufficient documentation

## 2012-08-28 DIAGNOSIS — Z79899 Other long term (current) drug therapy: Secondary | ICD-10-CM | POA: Insufficient documentation

## 2012-08-28 MED ORDER — BENZONATATE 100 MG PO CAPS: 100.0000 mg | ORAL_CAPSULE | Freq: Three times a day (TID) | ORAL | Status: DC

## 2012-08-28 MED ORDER — PREDNISONE 20 MG PO TABS: 40.0000 mg | ORAL_TABLET | Freq: Every day | ORAL | Status: DC

## 2012-08-28 NOTE — ED Notes (Signed)
Pt c/o cough/chest congestion x 2 wks; finished z pack without any improvement

## 2012-08-28 NOTE — ED Provider Notes (Signed)
History     CSN: [id number]  Arrival date & time 08/28/12  0020   First MD Initiated Contact with Patient 08/28/12 [phone]      Chief Complaint  Patient presents with  . Cough    (Consider location/radiation/quality/duration/timing/severity/associated sxs/prior treatment) HPI Comments: Pt states that he has been coughing, for the last 10 days - has been seen for bronchitis in the alst week and has normal CXR treated with cough meds and albuterol with no improvement.  The cough is daily, worse at night, not assocaited with fevers, vomitring,swelling.  He is a non smoker and has no new allergic exposures.  Patient is a 46 y.o. male presenting with cough. The history is provided by the patient and medical records.  Cough Pertinent negatives include no ear pain, no rhinorrhea and no sore throat.    Past Medical History  Diagnosis Date  . Hypertension     Past Surgical History  Procedure Date  . Back surgery     No family history on file.  History  Substance Use Topics  . Smoking status: Never Smoker   . Smokeless tobacco: Not on file  . Alcohol Use: Yes      Review of Systems  Constitutional: Negative for fever.  HENT: Positive for postnasal drip. Negative for ear pain, congestion, sore throat, rhinorrhea, mouth sores, trouble swallowing, neck pain, neck stiffness and sinus pressure.   Respiratory: Positive for cough.   Gastrointestinal: Negative for nausea.  Skin: Negative for rash.    Allergies  Review of patient's allergies indicates no known allergies.  Home Medications   Current Outpatient Rx  Name Route Sig Dispense Refill  . ALBUTEROL SULFATE HFA 108 (90 BASE) MCG/ACT IN AERS Inhalation Inhale 1-2 puffs into the lungs every 6 (six) hours as needed for wheezing. 1 Inhaler 0  . ALPRAZOLAM 0.5 MG PO TABS Oral Take 0.5 mg by mouth at bedtime as needed. Anxiety    . ATENOLOL 25 MG PO TABS Oral Take 25 mg by mouth daily.      . GUAIFENESIN 100 MG/5ML PO LIQD  Oral Take 5-10 mLs (100-200 mg total) by mouth every 4 (four) hours as needed for cough. 60 mL 0  . IBUPROFEN 200 MG PO TABS Oral Take 800 mg by mouth every 6 (six) hours as needed. pain    . OXYCODONE-ACETAMINOPHEN 5-325 MG PO TABS Oral Take 2 tablets by mouth every 4 (four) hours as needed for pain. 6 tablet 0    BP 145/108  Pulse 118  Temp 98.7 F (37.1 C)  Resp 20  SpO2 94%  Physical Exam  Nursing note and vitals reviewed. Constitutional:       Well appearing, no distress  HENT:       OP clear, MMM, no assymetry, exudate  Eyes: Conjunctivae normal are normal. Right eye exhibits no discharge. Left eye exhibits no discharge. No scleral icterus.  Neck: Normal range of motion. Neck supple.  Cardiovascular: Regular rhythm and intact distal pulses.  Exam reveals no gallop and no friction rub.   No murmur heard.      Pulses itact, mild tachycardia  Pulmonary/Chest: Effort normal and breath sounds normal. No respiratory distress. He has no wheezes. He has no rales.  Lymphadenopathy:    He has no cervical adenopathy.  Neurological: He is alert. Coordination normal.  Skin: Skin is warm and dry. No rash noted. No erythema.    ED Course  Procedures (including critical care time)  Labs Reviewed -  No data to display No results found.   No diagnosis found.    MDM  Well appearing mild hypoxia to 94 on ra, no distress, RR of 16 on my exam, speaks in full sentences, r/o pna.     CXR neg for infiltrate - pt stable for d/c.     Vida Roller, MD 08/28/12 [phone]

## 2013-02-20 ENCOUNTER — Encounter (HOSPITAL_COMMUNITY): Payer: Self-pay | Admitting: *Deleted

## 2013-02-20 ENCOUNTER — Emergency Department (HOSPITAL_COMMUNITY): Payer: Self-pay

## 2013-02-20 ENCOUNTER — Emergency Department (HOSPITAL_COMMUNITY)
Admission: EM | Admit: 2013-02-20 | Discharge: 2013-02-20 | Disposition: A | Discharge status: Home / Self Care | Payer: Self-pay | Attending: Emergency Medicine | Admitting: Emergency Medicine

## 2013-02-20 DIAGNOSIS — I10 Essential (primary) hypertension: Secondary | ICD-10-CM | POA: Insufficient documentation

## 2013-02-20 DIAGNOSIS — J189 Pneumonia, unspecified organism: Secondary | ICD-10-CM | POA: Insufficient documentation

## 2013-02-20 DIAGNOSIS — F1010 Alcohol abuse, uncomplicated: Secondary | ICD-10-CM | POA: Insufficient documentation

## 2013-02-20 DIAGNOSIS — R0789 Other chest pain: Secondary | ICD-10-CM

## 2013-02-20 DIAGNOSIS — F10929 Alcohol use, unspecified with intoxication, unspecified: Secondary | ICD-10-CM

## 2013-02-20 DIAGNOSIS — Z79899 Other long term (current) drug therapy: Secondary | ICD-10-CM | POA: Insufficient documentation

## 2013-02-20 DIAGNOSIS — R071 Chest pain on breathing: Secondary | ICD-10-CM | POA: Insufficient documentation

## 2013-02-20 LAB — CBC
HCT: 40.6 % (ref 39.0–52.0)
Hemoglobin: 14 g/dL (ref 13.0–17.0)
MCH: 30.4 pg (ref 26.0–34.0)
MCHC: 34.5 g/dL (ref 30.0–36.0)
MCV: 88.1 fL (ref 78.0–100.0)
Platelets: 166 10*3/uL (ref 150–400)
RBC: 4.61 MIL/uL (ref 4.22–5.81)
RDW: 12.8 % (ref 11.5–15.5)
WBC: 2.9 10*3/uL — ABNORMAL LOW (ref 4.0–10.5)

## 2013-02-20 LAB — RAPID URINE DRUG SCREEN, HOSP PERFORMED
Amphetamines: NOT DETECTED
Barbiturates: NOT DETECTED
Benzodiazepines: NOT DETECTED
Cocaine: NOT DETECTED
Opiates: NOT DETECTED
Tetrahydrocannabinol: NOT DETECTED

## 2013-02-20 LAB — BASIC METABOLIC PANEL
BUN: 10 mg/dL (ref 6–23)
CO2: 23 mEq/L (ref 19–32)
Calcium: 9 mg/dL (ref 8.4–10.5)
Chloride: 107 mEq/L (ref 96–112)
Creatinine, Ser: 0.88 mg/dL (ref 0.50–1.35)
GFR calc Af Amer: >90 mL/min (ref >90)
GFR calc non Af Amer: >90 mL/min (ref >90)
Glucose, Bld: 162 mg/dL — ABNORMAL HIGH (ref 70–99)
Potassium: 3.9 mEq/L (ref 3.5–5.1)
Sodium: 144 mEq/L (ref 135–145)

## 2013-02-20 LAB — PRO B NATRIURETIC PEPTIDE: Pro B Natriuretic peptide (BNP): 61.1 pg/mL (ref 0–125)

## 2013-02-20 LAB — POCT I-STAT TROPONIN I: Troponin i, poc: 0.01 ng/mL (ref 0.00–0.08)

## 2013-02-20 LAB — ETHANOL: Alcohol, Ethyl (B): 170 mg/dL — ABNORMAL HIGH (ref 0–11)

## 2013-02-20 MED ORDER — GI COCKTAIL ~~LOC~~
30.0000 mL | Freq: Once | ORAL | Status: AC
  Administered 2013-02-20: 30 mL via ORAL
  Filled 2013-02-20: qty 30

## 2013-02-20 MED ORDER — ONDANSETRON HCL 4 MG/2ML IJ SOLN
4.0000 mg | Freq: Once | INTRAMUSCULAR | Status: AC
  Administered 2013-02-20: 4 mg via INTRAVENOUS
  Filled 2013-02-20: qty 2

## 2013-02-20 MED ORDER — ASPIRIN 325 MG PO TABS
325.0000 mg | ORAL_TABLET | ORAL | Status: AC
  Administered 2013-02-20: 325 mg via ORAL
  Filled 2013-02-20: qty 1

## 2013-02-20 MED ORDER — HYDROMORPHONE HCL PF 1 MG/ML IJ SOLN
0.5000 mg | Freq: Once | INTRAMUSCULAR | Status: AC
  Administered 2013-02-20: 1 mg via INTRAVENOUS
  Filled 2013-02-20: qty 1

## 2013-02-20 MED ORDER — NITROGLYCERIN 0.4 MG SL SUBL
0.4000 mg | SUBLINGUAL_TABLET | SUBLINGUAL | Status: DC | PRN
  Administered 2013-02-20 (×3): 0.4 mg via SUBLINGUAL
  Filled 2013-02-20: qty 25

## 2013-02-20 MED ORDER — LEVOFLOXACIN 750 MG PO TABS: 750.0000 mg | ORAL_TABLET | Freq: Every day | ORAL | Status: DC

## 2013-02-20 MED ORDER — HYDROCODONE-ACETAMINOPHEN 5-325 MG PO TABS
2.0000 | ORAL_TABLET | Freq: Once | ORAL | Status: AC
  Administered 2013-02-20: 2 via ORAL
  Filled 2013-02-20: qty 2

## 2013-02-20 MED ORDER — LEVOFLOXACIN 500 MG PO TABS
750.0000 mg | ORAL_TABLET | Freq: Once | ORAL | Status: AC
  Administered 2013-02-20: 750 mg via ORAL
  Filled 2013-02-20 (×2): qty 2

## 2013-02-20 MED ORDER — LACTATED RINGERS IV BOLUS (SEPSIS)
1000.0000 mL | Freq: Once | INTRAVENOUS | Status: AC
  Administered 2013-02-20: 1000 mL via INTRAVENOUS

## 2013-02-20 NOTE — ED Provider Notes (Signed)
History     CSN: [id number]  Arrival date & time 02/20/13  0343   First MD Initiated Contact with Patient 02/20/13 0402      Chief Complaint  Patient presents with  . Chest Pain   HPI Joseph Fisher is a 47 y.o. male presents with at least a week's worth of right sided chest pain, he points to the right lower chest from the midclavicular to the mid axillary line. Says he's had a cough productive of sputum with occasional color. Occasional fevers and chills. Has had no nausea vomiting. Denies drinking today. Says he occasionally drinks.  Patient's pain is sharp, nonradiating, no other alleviating or exacerbating factors.   Past Medical History  Diagnosis Date  . Hypertension     Past Surgical History  Procedure Laterality Date  . Back surgery      History reviewed. No pertinent family history.  History  Substance Use Topics  . Smoking status: Never Smoker   . Smokeless tobacco: Not on file  . Alcohol Use: Yes      Review of Systems At least 10pt or greater review of systems completed and are negative except where specified in the HPI.  Allergies  Review of patient's allergies indicates no known allergies.  Home Medications   Current Outpatient Rx  Name  Route  Sig  Dispense  Refill  . atenolol (TENORMIN) 25 MG tablet   Oral   Take 37.5 mg by mouth daily.          . cetirizine (ZYRTEC) 10 MG tablet   Oral   Take 10 mg by mouth 2 (two) times daily.         Marland Kitchen ibuprofen (ADVIL,MOTRIN) 200 MG tablet   Oral   Take 800 mg by mouth every 6 (six) hours as needed. pain         . albuterol (PROVENTIL HFA;VENTOLIN HFA) 108 (90 BASE) MCG/ACT inhaler   Inhalation   Inhale 1-2 puffs into the lungs every 6 (six) hours as needed for wheezing.   1 Inhaler   0   . ALPRAZolam (XANAX) 0.5 MG tablet   Oral   Take 0.5 mg by mouth at bedtime as needed. Anxiety         . oxyCODONE-acetaminophen (PERCOCET/ROXICET) 5-325 MG per tablet   Oral   Take 2 tablets by  mouth every 4 (four) hours as needed for pain.   6 tablet   0     BP 146/95  Pulse 100  Temp(Src) 98.5 F (36.9 C) (Oral)  Resp 22  Ht 5\' 6"  (1.676 m)  Wt 200 lb (90.719 kg)  BMI 32.3 kg/m2  SpO2 100%  Physical Exam  Nursing notes reviewed.  Electronic medical record reviewed. VITAL SIGNS:   Filed Vitals:   02/20/13 8469 02/20/13 0839 02/20/13 0909 02/20/13 0914  BP:   194/116 187/126  Pulse:      Temp:   97.6 F (36.4 C)   TempSrc:   Oral   Resp:   18   Height:      Weight:      SpO2: 66% 97% 100%    CONSTITUTIONAL: Awake, oriented, appears non-toxic HENT: Atraumatic, normocephalic, oral mucosa pink and moist, airway patent. Nares patent without drainage. External ears normal. EYES: Conjunctiva clear, EOMI, PERRLA NECK: Trachea midline, non-tender, supple. Very large neck. CARDIOVASCULAR: Normal heart rate, Normal rhythm, No murmurs, rubs, gallops PULMONARY/CHEST: Clear to auscultation, no rhonchi, wheezes, or rales. Symmetrical breath sounds. Non-tender. ABDOMINAL: Non-distended, soft,  non-tender - no rebound or guarding.  BS normal. NEUROLOGIC: Non-focal, moving all four extremities, no gross sensory or motor deficits. EXTREMITIES: No clubbing, cyanosis, or edema SKIN: Warm, Dry, No erythema, No rash  ED Course  Procedures (including critical care time)  Date: 02/20/2013  Rate: 115  Rhythm: Sinus tachycardia  QRS Axis: normal  Intervals: normal  ST/T Wave abnormalities: T-wave inversions in II, III  Conduction Disutrbances: none  Narrative Interpretation: No ST or T wave abnormalities consistent with acute ischemia or infarction, nonspecific T-wave inversions seen on prior EKG dated 09/17/2011     Labs Reviewed  CBC - Abnormal; Notable for the following:    WBC 2.9 (*)    All other components within normal limits  BASIC METABOLIC PANEL - Abnormal; Notable for the following:    Glucose, Bld 162 (*)    All other components within normal limits  ETHANOL  - Abnormal; Notable for the following:    Alcohol, Ethyl (B) 170 (*)    All other components within normal limits  PRO B NATRIURETIC PEPTIDE  URINE RAPID DRUG SCREEN (HOSP PERFORMED)  POCT I-STAT TROPONIN I   Dg Chest Port 1 View  02/20/2013  *RADIOLOGY REPORT*  Clinical Data: Right anterior and lateral chest pain and shortness of breath.  PORTABLE CHEST - 1 VIEW  Comparison: 08/28/2012  Findings: Shallow inspiration.  Normal heart size and pulmonary vascularity.  Suggestion of focal area of infiltration in the right lung base which could represent early pneumonia.  No blunting of costophrenic angles.  No pneumothorax.  Mediastinal contours appear intact. Old left rib fracture.  IMPRESSION: Suggestion of focal infiltration in the right lung base.   Original Report Authenticated By: Burman Nieves, M.D.      1. RLL pneumonia   2. Right-sided chest wall pain   3. Alcohol intoxication       MDM  Joseph Fisher is a 47 y.o. male presenting with right-sided chest pain. Patient also appears to be intoxicated though mildly. Labs obtained showing an ethanol level of 170. Patient did desaturate in the emergency department this is while sleeping, think this is likely secondary to body habitus is very large neck and likely also has obstructive sleep apnea. Cardiac biomarkers are unremarkable. EKG shows no acute ischemia. Possible right lower lobe pneumonia, this persistence of right sided right lower chest pain caused by a pneumonia is very possible causing some pleurisy, we'll discharge the patient home with levofloxacin - I wanted to choose a broader antibiotic given the patient's alcohol use.  I explained the diagnosis and have given explicit precautions to return to the ER including worsening chest pain, or any other new or worsening symptoms. The patient understands and accepts the medical plan as it's been dictated and I have answered their questions. Discharge instructions concerning home care  and prescriptions have been given.  The patient is STABLE and is discharged to home in good condition.         Jones Skene, MD 02/23/13 [phone]

## 2013-02-20 NOTE — ED Notes (Signed)
Pt states he feels no better,  Pt states he hurts on his right side

## 2013-02-20 NOTE — Discharge Instructions (Signed)

## 2013-02-20 NOTE — ED Notes (Signed)
BP of 165/116 reported to MD.  MD requested that entire liter of LR be infused prior to discharge.

## 2013-02-20 NOTE — ED Notes (Signed)
Dr Patria Mane aware of elevated bp prior to discharge, pt actively vomiting, meds given. Pt aware he is not to drive, states he has a ride meeting out front, security aware and observing mode of transport.

## 2013-02-20 NOTE — ED Notes (Signed)
Pt states chest pain started several days ago and he just can't take the pain anymore.,  Denies nausea and vomiting

## 2013-02-21 ENCOUNTER — Telehealth (HOSPITAL_COMMUNITY): Payer: Self-pay | Admitting: Emergency Medicine

## 2013-02-21 NOTE — Progress Notes (Signed)
    CARE MANAGEMENT NOTE 02/21/2013  Patient:  Joseph Fisher, Joseph Fisher   Account Number:  [id number]  Date Initiated:  02/21/2013  Documentation initiated by:  [facility]  Subjective/Objective Assessment:     Action/Plan:   meds assistance   Anticipated DC Date:  02/21/2013   Anticipated DC Plan:  HOME/SELF CARE      DC Planning Services  CM consult  MATCH Program  Medication Assistance      Choice offered to / List presented to:             Status of service:  Completed, signed off Medicare Important Message given?   (If response is "NO", the following Medicare IM given date fields will be blank) Date Medicare IM given:   Date Additional Medicare IM given:    Discharge Disposition:  HOME/SELF CARE  Per UR Regulation:    If discussed at Long Length of Stay Meetings, dates discussed:    Comments:  02/21/2013 1930 NCM received call from [facility] and pt states he was dc on yesterday. He cannot afford Levaquin. States he is not working and currently does not have any drug coverage. Requesting assistance. Explained MATCH program and use once per year with a $3 copay for meds. Agreeable and will pick up meds from [facility] [address]. Faxed MATCH letter to [employer]. Provided pt with information on [facility] to schedule f/u appt.  Isidoro Donning RN CCM Case Mgmt phone [phone]

## 2014-05-28 ENCOUNTER — Emergency Department (HOSPITAL_COMMUNITY): Payer: Medicare Other

## 2014-05-28 ENCOUNTER — Encounter (HOSPITAL_COMMUNITY): Payer: Self-pay | Admitting: Emergency Medicine

## 2014-05-28 ENCOUNTER — Emergency Department (HOSPITAL_COMMUNITY)
Admission: EM | Admit: 2014-05-28 | Discharge: 2014-05-29 | Disposition: A | Discharge status: Home / Self Care | Payer: Medicare Other | Attending: Emergency Medicine | Admitting: Emergency Medicine

## 2014-05-28 DIAGNOSIS — K5730 Diverticulosis of large intestine without perforation or abscess without bleeding: Secondary | ICD-10-CM | POA: Diagnosis not present

## 2014-05-28 DIAGNOSIS — Z23 Encounter for immunization: Secondary | ICD-10-CM | POA: Insufficient documentation

## 2014-05-28 DIAGNOSIS — I10 Essential (primary) hypertension: Secondary | ICD-10-CM | POA: Insufficient documentation

## 2014-05-28 DIAGNOSIS — S0100XA Unspecified open wound of scalp, initial encounter: Secondary | ICD-10-CM | POA: Insufficient documentation

## 2014-05-28 DIAGNOSIS — S0990XA Unspecified injury of head, initial encounter: Secondary | ICD-10-CM | POA: Insufficient documentation

## 2014-05-28 DIAGNOSIS — T1490XA Injury, unspecified, initial encounter: Secondary | ICD-10-CM

## 2014-05-28 DIAGNOSIS — Z5189 Encounter for other specified aftercare: Secondary | ICD-10-CM

## 2014-05-28 DIAGNOSIS — S0510XA Contusion of eyeball and orbital tissues, unspecified eye, initial encounter: Secondary | ICD-10-CM | POA: Diagnosis not present

## 2014-05-28 DIAGNOSIS — S060X9A Concussion with loss of consciousness of unspecified duration, initial encounter: Secondary | ICD-10-CM

## 2014-05-28 DIAGNOSIS — Z79899 Other long term (current) drug therapy: Secondary | ICD-10-CM | POA: Diagnosis not present

## 2014-05-28 DIAGNOSIS — J9589 Other postprocedural complications and disorders of respiratory system, not elsewhere classified: Secondary | ICD-10-CM | POA: Insufficient documentation

## 2014-05-28 HISTORY — DX: Encounter for other specified aftercare: Z51.89

## 2014-05-28 LAB — COMPREHENSIVE METABOLIC PANEL
ALT: 31 U/L (ref 0–53)
AST: 29 U/L (ref 0–37)
Albumin: 4.2 g/dL (ref 3.5–5.2)
Alkaline Phosphatase: 55 U/L (ref 39–117)
Anion gap: 18 — ABNORMAL HIGH (ref 5–15)
BUN: 12 mg/dL (ref 6–23)
CO2: 20 mEq/L (ref 19–32)
Calcium: 8.9 mg/dL (ref 8.4–10.5)
Chloride: 105 mEq/L (ref 96–112)
Creatinine, Ser: 0.93 mg/dL (ref 0.50–1.35)
GFR calc Af Amer: >90 mL/min (ref >90)
GFR calc non Af Amer: >90 mL/min (ref >90)
Glucose, Bld: 180 mg/dL — ABNORMAL HIGH (ref 70–99)
Potassium: 3.4 mEq/L — ABNORMAL LOW (ref 3.7–5.3)
Sodium: 143 mEq/L (ref 137–147)
Total Bilirubin: 0.3 mg/dL (ref 0.3–1.2)
Total Protein: 6.9 g/dL (ref 6.0–8.3)

## 2014-05-28 LAB — CBC
HCT: 41.6 % (ref 39.0–52.0)
Hemoglobin: 14.2 g/dL (ref 13.0–17.0)
MCH: 30.3 pg (ref 26.0–34.0)
MCHC: 34.1 g/dL (ref 30.0–36.0)
MCV: 88.9 fL (ref 78.0–100.0)
Platelets: 164 10*3/uL (ref 150–400)
RBC: 4.68 MIL/uL (ref 4.22–5.81)
RDW: 12.6 % (ref 11.5–15.5)
WBC: 6.2 10*3/uL (ref 4.0–10.5)

## 2014-05-28 LAB — I-STAT CHEM 8, ED
BUN: 11 mg/dL (ref 6–23)
Calcium, Ion: 1.13 mmol/L (ref 1.12–1.23)
Chloride: 110 mEq/L (ref 96–112)
Creatinine, Ser: 1.3 mg/dL (ref 0.50–1.35)
Glucose, Bld: 190 mg/dL — ABNORMAL HIGH (ref 70–99)
HCT: 45 % (ref 39.0–52.0)
Hemoglobin: 15.3 g/dL (ref 13.0–17.0)
Potassium: 3.4 mEq/L — ABNORMAL LOW (ref 3.7–5.3)
Sodium: 143 mEq/L (ref 137–147)
TCO2: 19 mmol/L (ref 0–100)

## 2014-05-28 LAB — URINALYSIS, ROUTINE W REFLEX MICROSCOPIC
Bilirubin Urine: NEGATIVE
Glucose, UA: NEGATIVE mg/dL
Hgb urine dipstick: NEGATIVE
Ketones, ur: 15 mg/dL — AB
Leukocytes, UA: NEGATIVE
Nitrite: NEGATIVE
Protein, ur: NEGATIVE mg/dL
Specific Gravity, Urine: 1.025 (ref 1.005–1.030)
Urobilinogen, UA: 0.2 mg/dL (ref 0.0–1.0)
pH: 5.5 (ref 5.0–8.0)

## 2014-05-28 LAB — PROTIME-INR
INR: 0.97 (ref 0.00–1.49)
Prothrombin Time: 12.9 seconds (ref 11.6–15.2)

## 2014-05-28 LAB — ETHANOL: Alcohol, Ethyl (B): 313 mg/dL — ABNORMAL HIGH (ref 0–11)

## 2014-05-28 MED ORDER — IOHEXOL 300 MG/ML  SOLN
100.0000 mL | Freq: Once | INTRAMUSCULAR | Status: AC | PRN
  Administered 2014-05-28: 100 mL via INTRAVENOUS

## 2014-05-28 MED ORDER — ONDANSETRON HCL 4 MG/2ML IJ SOLN
4.0000 mg | Freq: Once | INTRAMUSCULAR | Status: AC
  Administered 2014-05-28: 4 mg via INTRAVENOUS
  Filled 2014-05-28: qty 2

## 2014-05-28 MED ORDER — FENTANYL CITRATE 0.05 MG/ML IJ SOLN
50.0000 ug | Freq: Once | INTRAMUSCULAR | Status: AC
  Administered 2014-05-28: 50 ug via INTRAVENOUS
  Filled 2014-05-28: qty 2

## 2014-05-28 MED ORDER — SODIUM CHLORIDE 0.9 % IV SOLN
1000.0000 mL | Freq: Once | INTRAVENOUS | Status: AC
  Administered 2014-05-28: 1000 mL via INTRAVENOUS

## 2014-05-28 MED ORDER — ONDANSETRON HCL 4 MG/2ML IJ SOLN
INTRAMUSCULAR | Status: AC
  Filled 2014-05-28: qty 2

## 2014-05-28 MED ORDER — ONDANSETRON HCL 4 MG/2ML IJ SOLN
4.0000 mg | Freq: Once | INTRAMUSCULAR | Status: AC
  Administered 2014-05-28: 4 mg via INTRAVENOUS

## 2014-05-28 MED ORDER — ACETAMINOPHEN 325 MG PO TABS
650.0000 mg | ORAL_TABLET | Freq: Once | ORAL | Status: AC
  Administered 2014-05-29: 650 mg via ORAL
  Filled 2014-05-28: qty 2

## 2014-05-28 MED ORDER — TETANUS-DIPHTHERIA TOXOIDS TD 5-2 LFU IM INJ
0.5000 mL | INJECTION | Freq: Once | INTRAMUSCULAR | Status: AC
  Administered 2014-05-28: 0.5 mL via INTRAMUSCULAR
  Filled 2014-05-28: qty 0.5

## 2014-05-28 MED ORDER — SODIUM CHLORIDE 0.9 % IV SOLN
1000.0000 mL | INTRAVENOUS | Status: DC
  Administered 2014-05-28 – 2014-05-29 (×3): 1000 mL via INTRAVENOUS
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
## 2014-05-28 NOTE — ED Notes (Signed)
Suture cart at bedside 

## 2014-05-28 NOTE — ED Notes (Signed)
Pt's belongings are a cell phone, sunglasses, three twenty dollar bills and one, one dollar bill and eye drops
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
## 2014-05-29 MED ORDER — MECLIZINE HCL 25 MG PO TABS: 25.0000 mg | ORAL_TABLET | Freq: Three times a day (TID) | ORAL | Status: DC | PRN

## 2014-05-29 MED ORDER — MECLIZINE HCL 25 MG PO TABS
25.0000 mg | ORAL_TABLET | Freq: Once | ORAL | Status: AC
  Administered 2014-05-29: 25 mg via ORAL
  Filled 2014-05-29: qty 1

## 2014-05-29 MED ORDER — METOCLOPRAMIDE HCL 5 MG/ML IJ SOLN
10.0000 mg | Freq: Once | INTRAMUSCULAR | Status: AC
  Administered 2014-05-29: 10 mg via INTRAVENOUS
  Filled 2014-05-29: qty 2

## 2014-05-29 MED ORDER — HYDROCODONE-ACETAMINOPHEN 5-325 MG PO TABS: 2.0000 | ORAL_TABLET | ORAL | Status: DC | PRN

## 2014-05-29 MED ORDER — DIAZEPAM 5 MG PO TABS
5.0000 mg | ORAL_TABLET | Freq: Once | ORAL | Status: AC
  Administered 2014-05-29: 5 mg via ORAL
  Filled 2014-05-29: qty 1

## 2014-05-29 MED ORDER — ONDANSETRON HCL 4 MG/2ML IJ SOLN
4.0000 mg | Freq: Once | INTRAMUSCULAR | Status: AC
  Administered 2014-05-29: 4 mg via INTRAVENOUS
  Filled 2014-05-29: qty 2

## 2014-05-29 NOTE — ED Provider Notes (Signed)
Patient initially seen for alcohol intoxication but after being observed in the ED overnight still notices that he gets very dizzy when he stands up. He is describing vertigo with associated nausea. Dizziness is reproduced by head movement. He'll be given a dose of meclizine and reassessed.  Delora Fuel, MD [id number]

## 2014-05-29 NOTE — ED Notes (Signed)
MD at bedside. 
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
## 2014-05-29 NOTE — Discharge Instructions (Signed)
Concussion  A concussion, or closed-head injury, is a brain injury caused by a direct blow to the head or by a quick and sudden movement (jolt) of the head or neck. Concussions are usually not life-threatening. Even so, the effects of a concussion can be serious. If you have had a concussion before, you are more likely to experience concussion-like symptoms after a direct blow to the head.   CAUSES  · Direct blow to the head, such as from running into another player during a soccer game, being hit in a fight, or hitting your head on a hard surface.  · A jolt of the head or neck that causes the brain to move back and forth inside the skull, such as in a car crash.  SIGNS AND SYMPTOMS  The signs of a concussion can be hard to notice. Early on, they may be missed by you, family members, and health care providers. You may look fine but act or feel differently.  Symptoms are usually temporary, but they may last for days, weeks, or even longer. Some symptoms may appear right away while others may not show up for hours or days. Every head injury is different. Symptoms include:  · Mild to moderate headaches that will not go away.  · A feeling of pressure inside your head.  · Having more trouble than usual:  ¨ Learning or remembering things you have heard.  ¨ Answering questions.  ¨ Paying attention or concentrating.  ¨ Organizing daily tasks.  ¨ Making decisions and solving problems.  · Slowness in thinking, acting or reacting, speaking, or reading.  · Getting lost or being easily confused.  · Feeling tired all the time or lacking energy (fatigued).  · Feeling drowsy.  · Sleep disturbances.  ¨ Sleeping more than usual.  ¨ Sleeping less than usual.  ¨ Trouble falling asleep.  ¨ Trouble sleeping (insomnia).  · Loss of balance or feeling lightheaded or dizzy.  · Nausea or vomiting.  · Numbness or tingling.  · Increased sensitivity to:  ¨ Sounds.  ¨ Lights.  ¨ Distractions.  · Vision problems or eyes that tire  easily.  · Diminished sense of taste or smell.  · Ringing in the ears.  · Mood changes such as feeling sad or anxious.  · Becoming easily irritated or angry for little or no reason.  · Lack of motivation.  · Seeing or hearing things other people do not see or hear (hallucinations).  DIAGNOSIS  Your health care provider can usually diagnose a concussion based on a description of your injury and symptoms. He or she will ask whether you passed out (lost consciousness) and whether you are having trouble remembering events that happened right before and during your injury.  Your evaluation might include:  · A brain scan to look for signs of injury to the brain. Even if the test shows no injury, you may still have a concussion.  · Blood tests to be sure other problems are not present.  TREATMENT  · Concussions are usually treated in an emergency department, in urgent care, or at a clinic. You may need to stay in the hospital overnight for further treatment.  · Tell your health care provider if you are taking any medicines, including prescription medicines, over-the-counter medicines, and natural remedies. Some medicines, such as blood thinners (anticoagulants) and aspirin, may increase the chance of complications. Also tell your health care provider whether you have had alcohol or are taking illegal drugs. This information   may affect treatment.  · Your health care provider will send you home with important instructions to follow.  · How fast you will recover from a concussion depends on many factors. These factors include how severe your concussion is, what part of your brain was injured, your age, and how healthy you were before the concussion.  · Most people with mild injuries recover fully. Recovery can take time. In general, recovery is slower in older persons. Also, persons who have had a concussion in the past or have other medical problems may find that it takes longer to recover from their current injury.  HOME  CARE INSTRUCTIONS  General Instructions  · Carefully follow the directions your health care provider gave you.  · Only take over-the-counter or prescription medicines for pain, discomfort, or fever as directed by your health care provider.  · Take only those medicines that your health care provider has approved.  · Do not drink alcohol until your health care provider says you are well enough to do so. Alcohol and certain other drugs may slow your recovery and can put you at risk of further injury.  · If it is harder than usual to remember things, write them down.  · If you are easily distracted, try to do one thing at a time. For example, do not try to watch TV while fixing dinner.  · Talk with family members or close friends when making important decisions.  · Keep all follow-up appointments. Repeated evaluation of your symptoms is recommended for your recovery.  · Watch your symptoms and tell others to do the same. Complications sometimes occur after a concussion. Older adults with a brain injury may have a higher risk of serious complications, such as a blood clot on the brain.  · Tell your teachers, school nurse, school counselor, coach, athletic trainer, or work manager about your injury, symptoms, and restrictions. Tell them about what you can or cannot do. They should watch for:  ¨ Increased problems with attention or concentration.  ¨ Increased difficulty remembering or learning new information.  ¨ Increased time needed to complete tasks or assignments.  ¨ Increased irritability or decreased ability to cope with stress.  ¨ Increased symptoms.  · Rest. Rest helps the brain to heal. Make sure you:  ¨ Get plenty of sleep at night. Avoid staying up late at night.  ¨ Keep the same bedtime hours on weekends and weekdays.  ¨ Rest during the day. Take daytime naps or rest breaks when you feel tired.  · Limit activities that require a lot of thought or concentration. These include:  ¨ Doing homework or job-related  work.  ¨ Watching TV.  ¨ Working on the computer.  · Avoid any situation where there is potential for another head injury (football, hockey, soccer, basketball, martial arts, downhill snow sports and horseback riding). Your condition will get worse every time you experience a concussion. You should avoid these activities until you are evaluated by the appropriate follow-up health care providers.  Returning To Your Regular Activities  You will need to return to your normal activities slowly, not all at once. You must give your body and brain enough time for recovery.  · Do not return to sports or other athletic activities until your health care provider tells you it is safe to do so.  · Ask your health care provider when you can drive, ride a bicycle, or operate heavy machinery. Your ability to react may be slower after a   brain injury. Never do these activities if you are dizzy.  · Ask your health care provider about when you can return to work or school.  Preventing Another Concussion  It is very important to avoid another brain injury, especially before you have recovered. In rare cases, another injury can lead to permanent brain damage, brain swelling, or death. The risk of this is greatest during the first 7-10 days after a head injury. Avoid injuries by:  · Wearing a seat belt when riding in a car.  · Drinking alcohol only in moderation.  · Wearing a helmet when biking, skiing, skateboarding, skating, or doing similar activities.  · Avoiding activities that could lead to a second concussion, such as contact or recreational sports, until your health care provider says it is okay.  · Taking safety measures in your home.  ¨ Remove clutter and tripping hazards from floors and stairways.  ¨ Use grab bars in bathrooms and handrails by stairs.  ¨ Place non-slip mats on floors and in bathtubs.  ¨ Improve lighting in dim areas.  SEEK MEDICAL CARE IF:  · You have increased problems paying attention or  concentrating.  · You have increased difficulty remembering or learning new information.  · You need more time to complete tasks or assignments than before.  · You have increased irritability or decreased ability to cope with stress.  · You have more symptoms than before.  Seek medical care if you have any of the following symptoms for more than 2 weeks after your injury:  · Lasting (chronic) headaches.  · Dizziness or balance problems.  · Nausea.  · Vision problems.  · Increased sensitivity to noise or light.  · Depression or mood swings.  · Anxiety or irritability.  · Memory problems.  · Difficulty concentrating or paying attention.  · Sleep problems.  · Feeling tired all the time.  SEEK IMMEDIATE MEDICAL CARE IF:  · You have severe or worsening headaches. These may be a sign of a blood clot in the brain.  · You have weakness (even if only in one hand, leg, or part of the face).  · You have numbness.  · You have decreased coordination.  · You vomit repeatedly.  · You have increased sleepiness.  · One pupil is larger than the other.  · You have convulsions.  · You have slurred speech.  · You have increased confusion. This may be a sign of a blood clot in the brain.  · You have increased restlessness, agitation, or irritability.  · You are unable to recognize people or places.  · You have neck pain.  · It is difficult to wake you up.  · You have unusual behavior changes.  · You lose consciousness.  MAKE SURE YOU:  · Understand these instructions.  · Will watch your condition.  · Will get help right away if you are not doing well or get worse.  Document Released: 01/04/2004 Document Revised: 10/19/2013 Document Reviewed: 05/06/2013  [employer]® Patient Information ©2015 [employer]. This information is not intended to replace advice given to you by your health care provider. Make sure you discuss any questions you have with your health care provider.

## 2014-05-29 NOTE — ED Notes (Addendum)
Patiient got up to pass urine broke into a sweat and and became nausated.Notified DR Peggye Pitt ordered 4mg  iv zofran

## 2014-05-29 NOTE — ED Notes (Signed)
Pt ambulated with an even gait down the hall and to the restroom.  Pt reports decreased dizziness.

## 2014-05-29 NOTE — ED Notes (Signed)
Try to ambulated patient, pt continues to have nausea upon rising out of bed and has a unsteady gait to bathroom, notified Dr Peggye Pitt of the situation.  Dr will come to further assess him.

## 2014-05-29 NOTE — ED Notes (Signed)
Pt reports increased dizziness with sitting and standing.  Pt continues unsteady gait.
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
## 2014-05-31 ENCOUNTER — Emergency Department (HOSPITAL_COMMUNITY)
Admission: EM | Admit: 2014-05-31 | Discharge: 2014-05-31 | Disposition: A | Discharge status: Home / Self Care | Payer: Medicare Other | Attending: Emergency Medicine | Admitting: Emergency Medicine

## 2014-05-31 ENCOUNTER — Encounter (HOSPITAL_COMMUNITY): Payer: Self-pay | Admitting: Emergency Medicine

## 2014-05-31 DIAGNOSIS — I10 Essential (primary) hypertension: Secondary | ICD-10-CM | POA: Diagnosis not present

## 2014-05-31 DIAGNOSIS — R21 Rash and other nonspecific skin eruption: Secondary | ICD-10-CM | POA: Insufficient documentation

## 2014-05-31 DIAGNOSIS — L259 Unspecified contact dermatitis, unspecified cause: Secondary | ICD-10-CM | POA: Insufficient documentation

## 2014-05-31 DIAGNOSIS — IMO0002 Reserved for concepts with insufficient information to code with codable children: Secondary | ICD-10-CM | POA: Insufficient documentation

## 2014-05-31 DIAGNOSIS — Z79899 Other long term (current) drug therapy: Secondary | ICD-10-CM | POA: Insufficient documentation

## 2014-05-31 MED ORDER — PREDNISONE 20 MG PO TABS: 40.0000 mg | ORAL_TABLET | Freq: Every day | ORAL | Status: DC

## 2014-05-31 MED ORDER — HYDROXYZINE HCL 25 MG PO TABS: 25.0000 mg | ORAL_TABLET | Freq: Four times a day (QID) | ORAL | Status: DC

## 2014-05-31 NOTE — Discharge Instructions (Signed)
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
## 2014-05-31 NOTE — ED Notes (Signed)
Pt was in ED on Saturday, was on heart monitor stickers. Took stickers on Sunday, almost 24 hours later. Pt has rash on location of where upper EKG stickers were located and spreading up neck. Pt has tried benedryl cream and cortisone cream without relief.

## 2014-06-02 NOTE — ED Provider Notes (Signed)
Medical screening examination/treatment/procedure(s) were performed by non-physician practitioner and as supervising physician I was immediately available for consultation/collaboration.   EKG Interpretation None        Tanna Furry, MD 06/02/14 [phone]

## 2014-06-03 ENCOUNTER — Encounter (HOSPITAL_COMMUNITY): Payer: Self-pay | Admitting: Emergency Medicine

## 2014-06-03 ENCOUNTER — Emergency Department (HOSPITAL_COMMUNITY)
Admission: EM | Admit: 2014-06-03 | Discharge: 2014-06-03 | Disposition: A | Discharge status: Home / Self Care | Payer: Medicare Other | Source: Home / Self Care | Attending: Emergency Medicine | Admitting: Emergency Medicine

## 2014-06-03 DIAGNOSIS — R578 Other shock: Secondary | ICD-10-CM | POA: Diagnosis not present

## 2014-06-03 DIAGNOSIS — K254 Chronic or unspecified gastric ulcer with hemorrhage: Secondary | ICD-10-CM | POA: Diagnosis not present

## 2014-06-03 DIAGNOSIS — Z4802 Encounter for removal of sutures: Secondary | ICD-10-CM

## 2014-06-03 NOTE — ED Notes (Signed)
Pt presents with request for staple removal. Pt states the staples were placed on Saturday. Pt reports soreness at the site, however the area appears to be well healed. Pt is A/O x4, in NAD, and vitals are WDL.
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
## 2014-06-03 NOTE — Discharge Instructions (Signed)
Return here as needed. Keep area clean and dry

## 2014-06-04 ENCOUNTER — Emergency Department (HOSPITAL_COMMUNITY): Payer: Medicare Other

## 2014-06-04 ENCOUNTER — Encounter (HOSPITAL_COMMUNITY): Payer: Self-pay | Admitting: Emergency Medicine

## 2014-06-04 ENCOUNTER — Encounter (HOSPITAL_COMMUNITY)
Admission: EM | Disposition: A | Discharge status: Home / Self Care | Payer: Self-pay | Source: Home / Self Care | Attending: Internal Medicine

## 2014-06-04 ENCOUNTER — Inpatient Hospital Stay (HOSPITAL_COMMUNITY)
Admission: EM | Admit: 2014-06-04 | Discharge: 2014-06-06 | DRG: 377 | Disposition: A | Discharge status: Home / Self Care | Payer: Medicare Other | Attending: Internal Medicine | Admitting: Internal Medicine

## 2014-06-04 DIAGNOSIS — D62 Acute posthemorrhagic anemia: Secondary | ICD-10-CM | POA: Diagnosis present

## 2014-06-04 DIAGNOSIS — I10 Essential (primary) hypertension: Secondary | ICD-10-CM | POA: Diagnosis present

## 2014-06-04 DIAGNOSIS — K2981 Duodenitis with bleeding: Secondary | ICD-10-CM | POA: Diagnosis present

## 2014-06-04 DIAGNOSIS — K254 Chronic or unspecified gastric ulcer with hemorrhage: Secondary | ICD-10-CM | POA: Diagnosis present

## 2014-06-04 DIAGNOSIS — F1010 Alcohol abuse, uncomplicated: Secondary | ICD-10-CM

## 2014-06-04 DIAGNOSIS — R000 Tachycardia, unspecified: Secondary | ICD-10-CM | POA: Diagnosis present

## 2014-06-04 DIAGNOSIS — Z79899 Other long term (current) drug therapy: Secondary | ICD-10-CM

## 2014-06-04 DIAGNOSIS — G4733 Obstructive sleep apnea (adult) (pediatric): Secondary | ICD-10-CM

## 2014-06-04 DIAGNOSIS — K7689 Other specified diseases of liver: Secondary | ICD-10-CM | POA: Diagnosis present

## 2014-06-04 DIAGNOSIS — K259 Gastric ulcer, unspecified as acute or chronic, without hemorrhage or perforation: Secondary | ICD-10-CM

## 2014-06-04 DIAGNOSIS — R578 Other shock: Secondary | ICD-10-CM | POA: Diagnosis present

## 2014-06-04 DIAGNOSIS — T3995XA Adverse effect of unspecified nonopioid analgesic, antipyretic and antirheumatic, initial encounter: Secondary | ICD-10-CM | POA: Diagnosis present

## 2014-06-04 DIAGNOSIS — F1990 Other psychoactive substance use, unspecified, uncomplicated: Secondary | ICD-10-CM

## 2014-06-04 DIAGNOSIS — K922 Gastrointestinal hemorrhage, unspecified: Secondary | ICD-10-CM | POA: Diagnosis present

## 2014-06-04 HISTORY — PX: ESOPHAGOGASTRODUODENOSCOPY: SHX5428

## 2014-06-04 LAB — CBC WITH DIFFERENTIAL/PLATELET
Basophils Absolute: 0 10*3/uL (ref 0.0–0.1)
Basophils Relative: 0 % (ref 0–1)
Eosinophils Absolute: 0.1 10*3/uL (ref 0.0–0.7)
Eosinophils Relative: 1 % (ref 0–5)
HCT: 28.4 % — ABNORMAL LOW (ref 39.0–52.0)
Hemoglobin: 9.6 g/dL — ABNORMAL LOW (ref 13.0–17.0)
Lymphocytes Relative: 38 % (ref 12–46)
Lymphs Abs: 4.4 10*3/uL — ABNORMAL HIGH (ref 0.7–4.0)
MCH: 30.8 pg (ref 26.0–34.0)
MCHC: 33.8 g/dL (ref 30.0–36.0)
MCV: 91 fL (ref 78.0–100.0)
Monocytes Absolute: 0.5 10*3/uL (ref 0.1–1.0)
Monocytes Relative: 4 % (ref 3–12)
Neutro Abs: 6.7 10*3/uL (ref 1.7–7.7)
Neutrophils Relative %: 57 % (ref 43–77)
Platelets: 206 10*3/uL (ref 150–400)
RBC: 3.12 MIL/uL — ABNORMAL LOW (ref 4.22–5.81)
RDW: 13.4 % (ref 11.5–15.5)
WBC: 11.7 10*3/uL — ABNORMAL HIGH (ref 4.0–10.5)

## 2014-06-04 LAB — I-STAT CHEM 8, ED
BUN: 39 mg/dL — ABNORMAL HIGH (ref 6–23)
Calcium, Ion: 1.11 mmol/L — ABNORMAL LOW (ref 1.12–1.23)
Chloride: 100 mEq/L (ref 96–112)
Creatinine, Ser: 1.3 mg/dL (ref 0.50–1.35)
Glucose, Bld: 276 mg/dL — ABNORMAL HIGH (ref 70–99)
HCT: 28 % — ABNORMAL LOW (ref 39.0–52.0)
Hemoglobin: 9.5 g/dL — ABNORMAL LOW (ref 13.0–17.0)
Potassium: 3.2 mEq/L — ABNORMAL LOW (ref 3.7–5.3)
Sodium: 137 mEq/L (ref 137–147)
TCO2: 22 mmol/L (ref 0–100)

## 2014-06-04 LAB — URINALYSIS, ROUTINE W REFLEX MICROSCOPIC
Bilirubin Urine: NEGATIVE
Glucose, UA: 250 mg/dL — AB
Hgb urine dipstick: NEGATIVE
Ketones, ur: NEGATIVE mg/dL
Leukocytes, UA: NEGATIVE
Nitrite: NEGATIVE
Protein, ur: 30 mg/dL — AB
Specific Gravity, Urine: 1.026 (ref 1.005–1.030)
Urobilinogen, UA: 0.2 mg/dL (ref 0.0–1.0)
pH: 5 (ref 5.0–8.0)

## 2014-06-04 LAB — COMPREHENSIVE METABOLIC PANEL
ALT: 33 U/L (ref 0–53)
AST: 25 U/L (ref 0–37)
Albumin: 2.9 g/dL — ABNORMAL LOW (ref 3.5–5.2)
Alkaline Phosphatase: 39 U/L (ref 39–117)
Anion gap: 17 — ABNORMAL HIGH (ref 5–15)
BUN: 42 mg/dL — ABNORMAL HIGH (ref 6–23)
CO2: 21 mEq/L (ref 19–32)
Calcium: 7.8 mg/dL — ABNORMAL LOW (ref 8.4–10.5)
Chloride: 100 mEq/L (ref 96–112)
Creatinine, Ser: 1.1 mg/dL (ref 0.50–1.35)
GFR calc Af Amer: >90 mL/min (ref >90)
GFR calc non Af Amer: 78 mL/min — ABNORMAL LOW (ref >90)
Glucose, Bld: 277 mg/dL — ABNORMAL HIGH (ref 70–99)
Potassium: 3.5 mEq/L — ABNORMAL LOW (ref 3.7–5.3)
Sodium: 138 mEq/L (ref 137–147)
Total Bilirubin: 0.3 mg/dL (ref 0.3–1.2)
Total Protein: 4.8 g/dL — ABNORMAL LOW (ref 6.0–8.3)

## 2014-06-04 LAB — URINE MICROSCOPIC-ADD ON

## 2014-06-04 LAB — HEMOGLOBIN AND HEMATOCRIT, BLOOD
HCT: 30.5 % — ABNORMAL LOW (ref 39.0–52.0)
Hemoglobin: 10.3 g/dL — ABNORMAL LOW (ref 13.0–17.0)

## 2014-06-04 LAB — PREPARE RBC (CROSSMATCH)

## 2014-06-04 LAB — MRSA PCR SCREENING: MRSA by PCR: NEGATIVE

## 2014-06-04 LAB — TROPONIN I: Troponin I: <0.3 ng/mL (ref <0.30)

## 2014-06-04 SURGERY — EGD (ESOPHAGOGASTRODUODENOSCOPY)
Anesthesia: Moderate Sedation

## 2014-06-04 MED ORDER — OCTREOTIDE LOAD VIA INFUSION
50.0000 ug | Freq: Once | INTRAVENOUS | Status: DC
  Administered 2014-06-04: 50 ug via INTRAVENOUS

## 2014-06-04 MED ORDER — OCTREOTIDE LOAD VIA INFUSION
50.0000 ug | Freq: Once | INTRAVENOUS | Status: AC
  Administered 2014-06-04: 50 ug via INTRAVENOUS
  Filled 2014-06-04: qty 25

## 2014-06-04 MED ORDER — ADULT MULTIVITAMIN W/MINERALS CH
1.0000 | ORAL_TABLET | Freq: Every day | ORAL | Status: DC
  Administered 2014-06-05 – 2014-06-06 (×2): 1 via ORAL
  Filled 2014-06-04 (×3): qty 1

## 2014-06-04 MED ORDER — SODIUM CHLORIDE 0.9 % IV SOLN
1000.0000 mL | INTRAVENOUS | Status: DC
  Administered 2014-06-04 – 2014-06-06 (×2): 1000 mL via INTRAVENOUS

## 2014-06-04 MED ORDER — SODIUM CHLORIDE 0.9 % IV SOLN
1000.0000 mL | Freq: Once | INTRAVENOUS | Status: AC
  Administered 2014-06-04: 1000 mL via INTRAVENOUS

## 2014-06-04 MED ORDER — BUTAMBEN-TETRACAINE-BENZOCAINE 2-2-14 % EX AERO
INHALATION_SPRAY | CUTANEOUS | Status: DC | PRN
  Administered 2014-06-04: 2 via TOPICAL

## 2014-06-04 MED ORDER — FENTANYL CITRATE 0.05 MG/ML IJ SOLN
INTRAMUSCULAR | Status: AC
  Filled 2014-06-04: qty 4

## 2014-06-04 MED ORDER — LORAZEPAM 2 MG/ML IJ SOLN: 1.0000 mg | Freq: Four times a day (QID) | INTRAMUSCULAR | Status: DC | PRN

## 2014-06-04 MED ORDER — FOLIC ACID 1 MG PO TABS
1.0000 mg | ORAL_TABLET | Freq: Every day | ORAL | Status: DC
  Administered 2014-06-05 – 2014-06-06 (×2): 1 mg via ORAL
  Filled 2014-06-04 (×3): qty 1

## 2014-06-04 MED ORDER — MIDAZOLAM HCL 5 MG/ML IJ SOLN
INTRAMUSCULAR | Status: AC
  Filled 2014-06-04: qty 2

## 2014-06-04 MED ORDER — SODIUM CHLORIDE 0.9 % IV SOLN
INTRAVENOUS | Status: DC
  Administered 2014-06-04: 500 mL via INTRAVENOUS

## 2014-06-04 MED ORDER — MIDAZOLAM HCL 10 MG/2ML IJ SOLN
INTRAMUSCULAR | Status: DC | PRN
  Administered 2014-06-04: 1 mg via INTRAVENOUS
  Administered 2014-06-04: 2 mg via INTRAVENOUS

## 2014-06-04 MED ORDER — SODIUM CHLORIDE 0.9 % IV SOLN
8.0000 mg/h | INTRAVENOUS | Status: DC
  Administered 2014-06-04: 8 mg/h via INTRAVENOUS
  Filled 2014-06-04 (×4): qty 80

## 2014-06-04 MED ORDER — SODIUM CHLORIDE 0.9 % IV SOLN
25.0000 ug/h | INTRAVENOUS | Status: DC
  Administered 2014-06-04: 50 ug/h via INTRAVENOUS
  Filled 2014-06-04 (×3): qty 1

## 2014-06-04 MED ORDER — LORAZEPAM 1 MG PO TABS
1.0000 mg | ORAL_TABLET | Freq: Four times a day (QID) | ORAL | Status: DC | PRN
  Administered 2014-06-04: 1 mg via ORAL
  Filled 2014-06-04: qty 1

## 2014-06-04 MED ORDER — PANTOPRAZOLE SODIUM 40 MG IV SOLR
40.0000 mg | Freq: Once | INTRAVENOUS | Status: AC
  Administered 2014-06-04: 40 mg via INTRAVENOUS
  Filled 2014-06-04: qty 40

## 2014-06-04 MED ORDER — METOCLOPRAMIDE HCL 5 MG/ML IJ SOLN
10.0000 mg | Freq: Four times a day (QID) | INTRAMUSCULAR | Status: AC
  Administered 2014-06-04 (×2): 10 mg via INTRAVENOUS
  Filled 2014-06-04 (×2): qty 2

## 2014-06-04 MED ORDER — FENTANYL CITRATE 0.05 MG/ML IJ SOLN
50.0000 ug | INTRAMUSCULAR | Status: DC | PRN
  Administered 2014-06-04 (×3): 50 ug via INTRAVENOUS
  Filled 2014-06-04 (×5): qty 2

## 2014-06-04 MED ORDER — DIPHENHYDRAMINE HCL 50 MG/ML IJ SOLN
INTRAMUSCULAR | Status: DC | PRN
  Administered 2014-06-04: 25 mg via INTRAVENOUS

## 2014-06-04 MED ORDER — VITAMIN B-1 100 MG PO TABS
100.0000 mg | ORAL_TABLET | Freq: Every day | ORAL | Status: DC
  Administered 2014-06-06: 100 mg via ORAL
  Filled 2014-06-04 (×4): qty 1

## 2014-06-04 MED ORDER — MECLIZINE HCL 25 MG PO TABS: 25.0000 mg | ORAL_TABLET | Freq: Three times a day (TID) | ORAL | Status: DC | PRN

## 2014-06-04 MED ORDER — PANTOPRAZOLE SODIUM 40 MG IV SOLR
40.0000 mg | Freq: Two times a day (BID) | INTRAVENOUS | Status: DC
  Administered 2014-06-04 – 2014-06-06 (×4): 40 mg via INTRAVENOUS
  Filled 2014-06-04 (×6): qty 40

## 2014-06-04 MED ORDER — FENTANYL CITRATE 0.05 MG/ML IJ SOLN
50.0000 ug | Freq: Once | INTRAMUSCULAR | Status: AC
  Administered 2014-06-04: 50 ug via INTRAVENOUS

## 2014-06-04 MED ORDER — ALPRAZOLAM 0.25 MG PO TABS: 0.5000 mg | ORAL_TABLET | Freq: Every evening | ORAL | Status: DC | PRN

## 2014-06-04 MED ORDER — DIPHENHYDRAMINE HCL 50 MG/ML IJ SOLN
INTRAMUSCULAR | Status: AC
  Filled 2014-06-04: qty 1

## 2014-06-04 MED ORDER — THIAMINE HCL 100 MG/ML IJ SOLN
100.0000 mg | Freq: Every day | INTRAMUSCULAR | Status: DC
  Administered 2014-06-04: 12:00:00 via INTRAVENOUS
  Administered 2014-06-05: 100 mg via INTRAVENOUS
  Filled 2014-06-04 (×3): qty 1

## 2014-06-04 MED ORDER — PANTOPRAZOLE SODIUM 40 MG IV SOLR
40.0000 mg | Freq: Two times a day (BID) | INTRAVENOUS | Status: DC
Start: 2014-06-07 — End: 2014-06-04

## 2014-06-04 MED ORDER — HYDROXYZINE HCL 25 MG PO TABS: 25.0000 mg | ORAL_TABLET | Freq: Four times a day (QID) | ORAL | Status: DC

## 2014-06-04 MED ORDER — FENTANYL CITRATE 0.05 MG/ML IJ SOLN
INTRAMUSCULAR | Status: DC | PRN
  Administered 2014-06-04: 25 ug via INTRAVENOUS
  Administered 2014-06-04 (×2): 12.5 ug via INTRAVENOUS

## 2014-06-04 MED ORDER — FENTANYL CITRATE 0.05 MG/ML IJ SOLN
50.0000 ug | Freq: Once | INTRAMUSCULAR | Status: AC
  Administered 2014-06-04: 50 ug via INTRAVENOUS
  Filled 2014-06-04: qty 2

## 2014-06-04 MED ORDER — ONDANSETRON HCL 4 MG/2ML IJ SOLN
4.0000 mg | Freq: Once | INTRAMUSCULAR | Status: AC
  Administered 2014-06-04: 4 mg via INTRAVENOUS
  Filled 2014-06-04: qty 2

## 2014-06-04 MED ORDER — SODIUM CHLORIDE 0.9 % IJ SOLN
3.0000 mL | Freq: Two times a day (BID) | INTRAMUSCULAR | Status: DC
  Administered 2014-06-04 – 2014-06-05 (×3): 3 mL via INTRAVENOUS

## 2014-06-04 MED ORDER — SODIUM CHLORIDE 0.9 % IV SOLN
Freq: Once | INTRAVENOUS | Status: AC
  Administered 2014-06-04: 05:00:00 via INTRAVENOUS

## 2014-06-04 NOTE — Op Note (Signed)
[facility] [place], [id number]   ENDOSCOPY PROCEDURE REPORT  PATIENT: Joseph Fisher, Joseph Fisher  MR#: [id number] BIRTHDATE: Sep 24, 1966 , 47  yrs. old GENDER: Male ENDOSCOPIST: Jerene Bears, MD REFERRED BY:  [facility] PROCEDURE DATE:  06/04/2014 PROCEDURE:  EGD, diagnostic ASA CLASS:     Class III INDICATIONS:  Hematemesis.   Melena.   Acute post hemorrhagic anemia. MEDICATIONS: These medications were titrated to patient response per physician's verbal order, Fentanyl 50 mcg IV, Versed 3 mg IV, and Benadryl 25 mg IV TOPICAL ANESTHETIC: Cetacaine Spray  DESCRIPTION OF PROCEDURE: After the risks benefits and alternatives of the procedure were thoroughly explained, informed consent was obtained.  The Pentax Gastroscope [id number] endoscope was introduced through the mouth and advanced to the second portion of the duodenum. Without limitations.  The instrument was slowly withdrawn as the mucosa was fully examined.     ESOPHAGUS: There was evidence of suspected Barrett's esophagus in the lower third of the esophagus at 33 cm from the incisors, extending to the GE junction at 40 cm.  No esophageal varices. Biopsies not performed today due to recent bleeding  STOMACH: Four ulcers were found in the gastric antrum, one of which was deep and appeared clean based.   Food residue was found, throughout the stomach, most in the proximal stomach which obscured complete visualization of the gastric mucosa.  DUODENUM: Moderate duodenal inflammation was found in the duodenal bulb with superficial ulceration and erythema.  Retroflexed views revealed obscured by food, no abnormalities in the visualized areas.     The scope was then withdrawn from the patient and the procedure completed.  COMPLICATIONS: There were no complications.  ENDOSCOPIC IMPRESSION: 1.   There was evidence of suspected Barrett's esophagus; no esophageal varices 2.   Four  ulcers were found in the gastric antrum 3.   Food residue with old heme obscuring complete visualization of the gastric mucosa 4.   Duodenal inflammation was found in the duodenal bulb    RECOMMENDATIONS: 1.  IV BID PPI for today, can change to PO tomorrow if stable 2.  H pylori Ab and treat if positive 3.  Monitor Hgb closely, transfuse again if necessary 4.  No NSAIDs 5.  Repeat EGD in 8-12 weeks to document healing of ulcers and biopsy esophagus to evaluate for Barrett's  eSigned:  Jerene Bears, MD 06/04/2014 3:52 PM   CC:The Patient  PATIENT NAME:  Joseph Fisher, Joseph Fisher MR#: [id number]

## 2014-06-04 NOTE — ED Provider Notes (Signed)
Medical screening examination/treatment/procedure(s) were performed by non-physician practitioner and as supervising physician I was immediately available for consultation/collaboration.   EKG Interpretation None        Evelina Bucy, MD 06/04/14 [phone]
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
## 2014-06-04 NOTE — Progress Notes (Signed)
[facility] PROGRESS NOTE  Joseph Fisher HXT:[id number] DOB: 28-Dec-1965 DOA: 06/04/2014 PCP: No primary provider on file. I have seen and examined pt who is a 48 yo admitted this am by Dr Alcario Drought with hematemesis and melena>> hemoglobin of 9.5 on presentation and was transfused PRBCs. On followup he denies any further bleeding. BP stable but he remains tachycardic. He has been seen by GI and awaiting Endo. We'll continue current management plan as per Dr. Alcario Drought continue to monitor his serial H&H's and further transfuse as clinically appropriate.     [facility] Pager [phone]. If 7PM-7AM, please contact night-coverage at [email or web address], password [facility] 06/04/2014, 12:51 PM  LOS: 0 days
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
## 2014-06-04 NOTE — ED Notes (Signed)
Orthostatic VS not done at this time due to the inability to lay flat (feels like he is going to throw up when he lays down) and unable to stand due to feeling like he is going to pass out.

## 2014-06-04 NOTE — Consult Note (Signed)
.   Consult Note for Dr. Mann/Dr. Benson Norway  Primary Care Physician:  No primary provider on file. Primary Gastroenterologist:    None Consulting MD: Jennette Kettle, DO  HPI: Joseph Fisher is a 48 y.o. male with past medical history of hypertension, chronic lower back pain resulting in disability, recent trauma to the left eye who presented to the ED with abrupt onset of hematemesis and melena. He reports he was feeling well when he went to bed yesterday and woke up feeling like he had to use the bathroom. When he got to the toilet he felt lightheaded and dizzy. He began vomiting and passed out. EMS was called and found him with a large amount of red blood in the bathroom. He was transported and on arrival was hypotensive and tachycardic. His blood pressure responded to IV fluid and he has received one unit of packed red blood cells. He was started on pantoprazole and octreotide infusion. He does report a history of somewhat heavy alcohol use. He drinks 3-4 days per week but not always heavily. When he does drink heavily he can drink 12 beers. Hemoglobin was 9.6 down from 13.28 days ago. Prior to this episode he denies abdominal pain, nausea, vomiting. No change in bowel habit. He does report heavy use of BC powder. Denies family history of GI tract malignancy.   Past Medical History  Diagnosis Date  . Hypertension     Past Surgical History  Procedure Laterality Date  . Back surgery      Prior to Admission medications   Medication Sig Start Date End Date Taking? Authorizing Provider  ALPRAZolam Duanne Moron) 0.5 MG tablet Take 0.5 mg by mouth at bedtime as needed for anxiety or sleep.   Yes Historical Provider, MD  Aspirin-Acetaminophen-Caffeine (GOODY HEADACHE PO) Take 2-3 packets by mouth 3 (three) times daily as needed (pain).   Yes Historical Provider, MD  HYDROcodone-acetaminophen (NORCO/VICODIN) 5-325 MG per tablet Take 2 tablets by mouth every 4 (four) hours as needed. 05/29/14  Yes Malvin Johns, MD  hydrOXYzine (ATARAX/VISTARIL) 25 MG tablet Take 1 tablet (25 mg total) by mouth every 6 (six) hours. 05/31/14  Yes Noland Fordyce, PA-C  lisinopril-hydrochlorothiazide (PRINZIDE,ZESTORETIC) 20-12.5 MG per tablet Take 1 tablet by mouth daily.   Yes Historical Provider, MD  meclizine (ANTIVERT) 25 MG tablet Take 1 tablet (25 mg total) by mouth 3 (three) times daily as needed for dizziness. 05/29/14  Yes Malvin Johns, MD  oxyCODONE-acetaminophen (PERCOCET) 10-325 MG per tablet Take 1 tablet by mouth 2 (two) times daily. scheduled   Yes Historical Provider, MD  acetaminophen (TYLENOL) 500 MG tablet Take 500 mg by mouth every 6 (six) hours as needed (pain).    Historical Provider, MD    Current Facility-Administered Medications  Medication Dose Route Frequency Provider Last Rate Last Dose  . 0.9 %  sodium chloride infusion  1,000 mL Intravenous Continuous Merryl Hacker, MD 125 mL/hr at 06/04/14 1000 1,000 mL at 06/04/14 1000  . fentaNYL (SUBLIMAZE) injection 50 mcg  50 mcg Intravenous Q1H PRN Etta Quill, DO   50 mcg at 06/04/14 1155  . folic acid (FOLVITE) tablet 1 mg  1 mg Oral Daily Etta Quill, DO      . LORazepam (ATIVAN) tablet 1 mg  1 mg Oral Q6H PRN Etta Quill, DO       Or  . LORazepam (ATIVAN) injection 1 mg  1 mg Intravenous Q6H PRN Etta Quill, DO      .  multivitamin with minerals tablet 1 tablet  1 tablet Oral Daily Etta Quill, DO      . octreotide (SANDOSTATIN) 500 mcg in sodium chloride 0.9 % 250 mL (2 mcg/mL) infusion  25-50 mcg/hr Intravenous Continuous Merryl Hacker, MD 25 mL/hr at 06/04/14 1000 50 mcg/hr at 06/04/14 1000  . pantoprazole (PROTONIX) 80 mg in sodium chloride 0.9 % 250 mL (0.32 mg/mL) infusion  8 mg/hr Intravenous Continuous Etta Quill, DO 25 mL/hr at 06/04/14 1000 8 mg/hr at 06/04/14 1000  . [START ON 06/07/2014] pantoprazole (PROTONIX) injection 40 mg  40 mg Intravenous Q12H Etta Quill, DO      . sodium chloride 0.9 %  injection 3 mL  3 mL Intravenous Q12H Etta Quill, DO      . thiamine (VITAMIN B-1) tablet 100 mg  100 mg Oral Daily Etta Quill, DO       Or  . thiamine (B-1) injection 100 mg  100 mg Intravenous Daily Etta Quill, DO        Allergies as of 06/04/2014  . (No Known Allergies)    History reviewed. No pertinent family history.  History   Social History  . Marital Status: Divorced    Spouse Name: N/A    Number of Children: N/A  . Years of Education: N/A   Occupational History  . Not on file.   Social History Main Topics  . Smoking status: Never Smoker   . Smokeless tobacco: Not on file  . Alcohol Use: Yes  . Drug Use: No  . Sexual Activity: Yes    Birth Control/ Protection: None   Other Topics Concern  . Not on file   Social History Narrative  . No narrative on file    Review of Systems: As per history of present illness, otherwise negative  Physical Exam: Vital signs in last 24 hours: Temp:  [97.6 F (36.4 C)-98.5 F (36.9 C)] 98.2 F (36.8 C) (08/08 0818) Pulse Rate:  [93-115] 101 (08/08 0818) Resp:  [18-30] 25 (08/08 0818) BP: (94-120)/(60-77) 106/69 mmHg (08/08 0638) SpO2:  [89 %-100 %] 100 % (08/08 0818) Weight:  [180 lb (81.647 kg)-206 lb 5.6 oz (93.6 kg)] 206 lb 5.6 oz (93.6 kg) (08/08 0441) Last BM Date: 06/03/14 Gen: awake, alert, NAD, bruised left HEENT: anicteric, op clear CV: Mildly tachycardic and regular no murmur Pulm: CTA b/l Abd: soft, NT/ND, +BS throughout Ext: no c/c/e Neuro: nonfocal  Lab Results:  Recent Labs  06/04/14 0225 06/04/14 0247 06/04/14 1029  WBC 11.7*  --   --   HGB 9.6* 9.5* 10.3*  HCT 28.4* 28.0* 30.5*  PLT 206  --   --    BMET  Recent Labs  06/04/14 0225 06/04/14 0247  NA 138 137  K 3.5* 3.2*  CL 100 100  CO2 21  --   GLUCOSE 277* 276*  BUN 42* 39*  CREATININE 1.10 1.30  CALCIUM 7.8*  --    LFT  Recent Labs  06/04/14 0225  PROT 4.8*  ALBUMIN 2.9*  AST 25  ALT 33  ALKPHOS 39    BILITOT 0.3   Studies/Results: Dg Chest Portable 1 View  06/04/2014   CLINICAL DATA:  Hematemesis.  Rectal bleeding.  EXAM: PORTABLE CHEST - 1 VIEW  COMPARISON:  Chest radiograph and CT of the chest performed 05/28/2014  FINDINGS: The lungs are well-aerated. Mild bibasilar airspace opacities raise concern for pneumonia. There is no evidence of pleural effusion or pneumothorax.  The cardiomediastinal silhouette is borderline normal in size. No acute osseous abnormalities are seen.  IMPRESSION: Mild bibasilar airspace opacities raise question for pneumonia.   Electronically Signed   By: Garald Balding M.D.   On: 06/04/2014 02:57   CT CHEST, ABDOMEN, AND PELVIS WITH CONTRAST   TECHNIQUE: Multidetector CT imaging of the chest, abdomen and pelvis was performed following the standard protocol during bolus administration of intravenous contrast.   CONTRAST:  114mL OMNIPAQUE IOHEXOL 300 MG/ML  SOLN   COMPARISON:  Previous lumbar spine radiographs and CT myelogram.   FINDINGS: CT CHEST FINDINGS   Normal sized heart. Clear lungs. No fracture, pneumothorax or pleural fluid. No lung nodules or enlarged lymph nodes. Thoracic spine degenerative changes.   CT ABDOMEN AND PELVIS FINDINGS   Mild diffuse low density of the liver relative to the spleen. Normal appearing spleen, pancreas, gallbladder, adrenal glands, kidneys, urinary bladder and prostate gland with the exception of small central prostatic calcifications. Multiple colonic diverticula. No enlarged lymph nodes. Post laminectomy changes, it interbody bone plug and pedicle screw and rod fixation at the L4-5 level. Lumbar spine degenerative changes. Mild thoracolumbar scoliosis.   IMPRESSION: 1. Known acute abnormality in the chest, abdomen or pelvis. 2. Mild diffuse hepatic steatosis. 3. Lumbar spine postsurgical changes.     Electronically Signed   By: Enrique Sack M.D.   On: 05/28/2014 19:38  Impression / Plan:   48 y.o. male  with past medical history of hypertension, chronic lower back pain resulting in disability, recent trauma to the left eye who presented to the ED with abrupt onset of hematemesis and melena  1. UGI bleeding -- acute, now status post 1 unit of packed cells with improvement in hemoglobin to 10.6. No longer hypotensive, still mildly tachycardic. No bleeding since before 6 AM, including no hematemesis or melena. I recommended upper endoscopy for diagnosis and possible treatment. The nature of the procedure, as well as the risks, benefits, and alternatives were carefully and thoroughly reviewed with the patient. Ample time for discussion and questions allowed. The patient understood, was satisfied, and agreed to proceed. Continue PPI infusion. We'll continue octreotide until endoscopy, though my suspicion for variceal bleed is actually lower than for PUD.       LOS: 0 days   Farhad Burleson M  06/04/2014, 12:52 PM

## 2014-06-05 DIAGNOSIS — F1010 Alcohol abuse, uncomplicated: Secondary | ICD-10-CM

## 2014-06-05 DIAGNOSIS — G4733 Obstructive sleep apnea (adult) (pediatric): Secondary | ICD-10-CM

## 2014-06-05 DIAGNOSIS — F1910 Other psychoactive substance abuse, uncomplicated: Secondary | ICD-10-CM

## 2014-06-05 LAB — CBC
HCT: 26.4 % — ABNORMAL LOW (ref 39.0–52.0)
Hemoglobin: 9 g/dL — ABNORMAL LOW (ref 13.0–17.0)
MCH: 30.5 pg (ref 26.0–34.0)
MCHC: 34.1 g/dL (ref 30.0–36.0)
MCV: 89.5 fL (ref 78.0–100.0)
Platelets: 109 10*3/uL — ABNORMAL LOW (ref 150–400)
RBC: 2.95 MIL/uL — ABNORMAL LOW (ref 4.22–5.81)
RDW: 14.8 % (ref 11.5–15.5)
WBC: 5.2 10*3/uL (ref 4.0–10.5)

## 2014-06-05 LAB — TYPE AND SCREEN
ABO/RH(D): O POS
Antibody Screen: NEGATIVE
Unit division: 0

## 2014-06-05 LAB — BASIC METABOLIC PANEL
Anion gap: 11 (ref 5–15)
BUN: 18 mg/dL (ref 6–23)
CO2: 24 mEq/L (ref 19–32)
Calcium: 8.1 mg/dL — ABNORMAL LOW (ref 8.4–10.5)
Chloride: 103 mEq/L (ref 96–112)
Creatinine, Ser: 0.78 mg/dL (ref 0.50–1.35)
GFR calc Af Amer: >90 mL/min (ref >90)
GFR calc non Af Amer: >90 mL/min (ref >90)
Glucose, Bld: 134 mg/dL — ABNORMAL HIGH (ref 70–99)
Potassium: 3.5 mEq/L — ABNORMAL LOW (ref 3.7–5.3)
Sodium: 138 mEq/L (ref 137–147)

## 2014-06-05 LAB — HEMOGLOBIN: Hemoglobin: 8.9 g/dL — ABNORMAL LOW (ref 13.0–17.0)

## 2014-06-05 MED ORDER — ACETAMINOPHEN 325 MG PO TABS
650.0000 mg | ORAL_TABLET | ORAL | Status: DC | PRN
  Administered 2014-06-05 – 2014-06-06 (×3): 650 mg via ORAL
  Filled 2014-06-05 (×3): qty 2

## 2014-06-05 MED ORDER — ZOLPIDEM TARTRATE 5 MG PO TABS
5.0000 mg | ORAL_TABLET | Freq: Once | ORAL | Status: AC
  Administered 2014-06-05: 5 mg via ORAL
  Filled 2014-06-05: qty 1

## 2014-06-05 MED ORDER — SUCRALFATE 1 G PO TABS
1.0000 g | ORAL_TABLET | Freq: Three times a day (TID) | ORAL | Status: DC
  Administered 2014-06-05 – 2014-06-06 (×6): 1 g via ORAL
  Filled 2014-06-05 (×8): qty 1

## 2014-06-05 MED ORDER — LISINOPRIL 20 MG PO TABS
20.0000 mg | ORAL_TABLET | Freq: Every day | ORAL | Status: DC
  Administered 2014-06-06: 20 mg via ORAL
  Filled 2014-06-05: qty 1

## 2014-06-05 MED ORDER — OXYCODONE-ACETAMINOPHEN 5-325 MG PO TABS
1.0000 | ORAL_TABLET | Freq: Four times a day (QID) | ORAL | Status: DC | PRN
  Administered 2014-06-05 – 2014-06-06 (×2): 2 via ORAL
  Administered 2014-06-06 (×3): 1 via ORAL
  Filled 2014-06-05 (×4): qty 2

## 2014-06-05 MED ORDER — LISINOPRIL 20 MG PO TABS
20.0000 mg | ORAL_TABLET | Freq: Every day | ORAL | Status: DC
  Administered 2014-06-05: 20 mg via ORAL
  Filled 2014-06-05: qty 1

## 2014-06-05 NOTE — Progress Notes (Signed)
Progress Note   Subjective  Pt wants to get out of bed 1 BM at 2 am solid, but black No N/V, is hungry and requesting solid food Asking about sleep apnea because overnight nurse told him he desaturates at night   Objective  Vital signs in last 24 hours: Temp:  [98.5 F (36.9 C)-99.7 F (37.6 C)] 98.7 F (37.1 C) (08/09 0838) Pulse Rate:  [103-122] 110 (08/09 0838) Resp:  [22-29] 28 (08/09 0838) BP: (115-165)/(70-96) 147/94 mmHg (08/09 0838) SpO2:  [95 %-100 %] 100 % (08/09 0838) Last BM Date: 06/03/14 Gen: awake, alert, NAD HEENT: anicteric, op clear CV: RRR, no mrg Pulm: CTA b/l Abd: soft, NT/ND, +BS throughout Ext: no c/c/e Neuro: nonfocal   Intake/Output from previous day: 08/08 0701 - 08/09 0700 In: 1725 [I.V.:1725] Out: 4125 [Urine:4125] Intake/Output this shift: Total I/O In: 250 [I.V.:250] Out: 1000 [Urine:1000]  Lab Results:  Recent Labs  06/04/14 0225 06/04/14 0247 06/04/14 1029 06/05/14 0300  WBC 11.7*  --   --  5.2  HGB 9.6* 9.5* 10.3* 9.0*  HCT 28.4* 28.0* 30.5* 26.4*  PLT 206  --   --  109*   BMET  Recent Labs  06/04/14 0225 06/04/14 0247 06/05/14 0300  NA 138 137 138  K 3.5* 3.2* 3.5*  CL 100 100 103  CO2 21  --  24  GLUCOSE 277* 276* 134*  BUN 42* 39* 18  CREATININE 1.10 1.30 0.78  CALCIUM 7.8*  --  8.1*   LFT  Recent Labs  06/04/14 0225  PROT 4.8*  ALBUMIN 2.9*  AST 25  ALT 33  ALKPHOS 39  BILITOT 0.3    Studies/Results: Dg Chest Portable 1 View  06/04/2014   CLINICAL DATA:  Hematemesis.  Rectal bleeding.  EXAM: PORTABLE CHEST - 1 VIEW  COMPARISON:  Chest radiograph and CT of the chest performed 05/28/2014  FINDINGS: The lungs are well-aerated. Mild bibasilar airspace opacities raise concern for pneumonia. There is no evidence of pleural effusion or pneumothorax.  The cardiomediastinal silhouette is borderline normal in size. No acute osseous abnormalities are seen.  IMPRESSION: Mild bibasilar airspace opacities  raise question for pneumonia.   Electronically Signed   By: Garald Balding M.D.   On: 06/04/2014 02:57      Assessment & Plan  48 y.o. male with past medical history of hypertension, chronic lower back pain resulting in disability, recent trauma to the left eye who presented to the ED with abrupt onset of hematemesis and melena   1.  PUD with UGI bleeding -- patient with multiple gastric ulcers and duodenitis likely secondary to NSAID overuse. H. pylori serology pending, and this could be at play also. Hemoglobin down slightly, but could be further equilibration. Continue to monitor hemoglobin. Will advance diet and take off bed rest. Monitor hemoglobin this evening and tomorrow morning, and if stable anticipate discharge late tomorrow.  BID PPI. Surveillance endoscopy recommended in 8-12 weeks to document and confirm healing of ulcers  2. Probable Barrett's -- not biopsied in the setting of some bleeding, plan esophageal biopsies for confirmation at surveillance endoscopy.  3.  Probable sleep apnea -- recommend pulmonary consultation for sleep study after discharge  4.  ETOH -- I recommended that he avoid alcohol given ulcer disease, and once ulcers have healed he should reduce alcohol to a safe level no more than 2 drinks daily.   Principal Problem:   GI bleed Active Problems:   Acute blood loss anemia  Multiple gastric ulcers     LOS: 1 day   Landree Fernholz M  06/05/2014, 10:26 AM

## 2014-06-05 NOTE — Progress Notes (Signed)
Paged Dr. Hilarie Fredrickson at Dr. Maryjane Hurter request to verify pt could transfer to floor. Dr. Hilarie Fredrickson said that was fine.

## 2014-06-05 NOTE — Progress Notes (Signed)
Attempted to call report to 5W 

## 2014-06-05 NOTE — Progress Notes (Signed)
Received report from [place], awaiting pt arrival to the unit.

## 2014-06-05 NOTE — Progress Notes (Signed)
[facility] PROGRESS NOTE  Joseph Fisher IHK:[id number] DOB: [birth year] DOA: 06/04/2014 PCP: No primary provider on file.  Assessment/Plan: GI bleed/ multiple Gastric  Ulcers -Per EGD -appreciate GI assistance -continue PPI, pt educated to avoid NSAIDs, and alcohol -await H Pylori Active Problems:  Acute blood loss anemia -d/t above, hgb trending down s/p tranfusion on admission -follow recheck h/h and transfuse as needed  Code Status: FULL Family Communication: none at bedside, family was updated on 8/8 at bedside Disposition Plan: transfer to tele   Consultants:  GI  Procedures:  EGD ENDOSCOPIC IMPRESSION:  1. There was evidence of suspected Barrett's esophagus; no  esophageal varices  2. Four ulcers were found in the gastric antrum  3. Food residue with old heme obscuring complete visualization of  the gastric mucosa  4. Duodenal inflammation was found in the duodenal bulb    Antibiotics:  none  HPI/Subjective: Reports melena x1 last pm but none so far today, denies hematemsis  Objective: Filed Vitals:   06/05/14 1148  BP: 145/97  Pulse: 112  Temp: 99.4 F (37.4 C)  Resp: 24    Intake/Output Summary (Last 24 hours) at 06/05/14 1205 Last data filed at 06/05/14 1042  Gross per 24 hour  Intake   1850 ml  Output   5125 ml  Net  -3275 ml   Filed Weights   06/04/14 0209 06/04/14 0441  Weight: 81.647 kg (180 lb) 93.6 kg (206 lb 5.6 oz)    Exam:  General: alert & oriented x 3 In NAD Cardiovascular: RRR, nl S1 s2 Respiratory: CTAB Abdomen: soft +BS NT/ND, no masses palpable Extremities: No cyanosis and no edema    Data Reviewed: Basic Metabolic Panel:  Recent Labs Lab 06/04/14 0225 06/04/14 0247 06/05/14 0300  NA 138 137 138  K 3.5* 3.2* 3.5*  CL 100 100 103  CO2 21  --  24  GLUCOSE 277* 276* 134*  BUN 42* 39* 18  CREATININE 1.10 1.30 0.78  CALCIUM 7.8*  --  8.1*   Liver Function Tests:  Recent Labs Lab 06/04/14 0225   AST 25  ALT 33  ALKPHOS 39  BILITOT 0.3  PROT 4.8*  ALBUMIN 2.9*   No results found for this basename: LIPASE, AMYLASE,  in the last 168 hours No results found for this basename: AMMONIA,  in the last 168 hours CBC:  Recent Labs Lab 06/04/14 0225 06/04/14 0247 06/04/14 1029 06/05/14 0300  WBC 11.7*  --   --  5.2  NEUTROABS 6.7  --   --   --   HGB 9.6* 9.5* 10.3* 9.0*  HCT 28.4* 28.0* 30.5* 26.4*  MCV 91.0  --   --  89.5  PLT 206  --   --  109*   Cardiac Enzymes:  Recent Labs Lab 06/04/14 0241  TROPONINI <0.30   BNP (last 3 results) No results found for this basename: PROBNP,  in the last 8760 hours CBG: No results found for this basename: GLUCAP,  in the last 168 hours  Recent Results (from the past 240 hour(s))  MRSA PCR SCREENING     Status: None   Collection Time    06/04/14  4:44 AM      Result Value Ref Range Status   MRSA by PCR NEGATIVE  NEGATIVE Final   Comment:            The GeneXpert MRSA Assay (FDA     approved for NASAL specimens     only), is one  component of a     comprehensive MRSA colonization     surveillance program. It is not     intended to diagnose MRSA     infection nor to guide or     monitor treatment for     MRSA infections.     Studies: Dg Chest Portable 1 View  06/04/2014   CLINICAL DATA:  Hematemesis.  Rectal bleeding.  EXAM: PORTABLE CHEST - 1 VIEW  COMPARISON:  Chest radiograph and CT of the chest performed 05/28/2014  FINDINGS: The lungs are well-aerated. Mild bibasilar airspace opacities raise concern for pneumonia. There is no evidence of pleural effusion or pneumothorax.  The cardiomediastinal silhouette is borderline normal in size. No acute osseous abnormalities are seen.  IMPRESSION: Mild bibasilar airspace opacities raise question for pneumonia.   Electronically Signed   By: Garald Balding M.D.   On: 06/04/2014 02:57    Scheduled Meds: . folic acid  1 mg Oral Daily  . multivitamin with minerals  1 tablet Oral Daily   . pantoprazole (PROTONIX) IV  40 mg Intravenous Q12H  . sodium chloride  3 mL Intravenous Q12H  . sucralfate  1 g Oral TID WC & HS  . thiamine  100 mg Oral Daily   Or  . thiamine  100 mg Intravenous Daily   Continuous Infusions: . sodium chloride 1,000 mL (06/04/14 1900)  . sodium chloride 500 mL (06/04/14 1458)    Principal Problem:   GI bleed Active Problems:   Acute blood loss anemia   Multiple gastric ulcers    Time spent: [facility] Pager [phone]. If 7PM-7AM, please contact night-coverage at [email or web address], password [facility] 06/05/2014, 12:05 PM  LOS: 1 day

## 2014-06-05 NOTE — Progress Notes (Signed)
Called report to [facility] on 5W.

## 2014-06-06 ENCOUNTER — Encounter (HOSPITAL_COMMUNITY): Payer: Self-pay | Admitting: Internal Medicine

## 2014-06-06 LAB — CBC
HCT: 23.9 % — ABNORMAL LOW (ref 39.0–52.0)
Hemoglobin: 8.2 g/dL — ABNORMAL LOW (ref 13.0–17.0)
MCH: 30.7 pg (ref 26.0–34.0)
MCHC: 34.3 g/dL (ref 30.0–36.0)
MCV: 89.5 fL (ref 78.0–100.0)
Platelets: 107 10*3/uL — ABNORMAL LOW (ref 150–400)
RBC: 2.67 MIL/uL — ABNORMAL LOW (ref 4.22–5.81)
RDW: 14.4 % (ref 11.5–15.5)
WBC: 4.1 10*3/uL (ref 4.0–10.5)

## 2014-06-06 LAB — COMPREHENSIVE METABOLIC PANEL
ALT: 15 U/L (ref 0–53)
AST: 9 U/L (ref 0–37)
Albumin: 2.7 g/dL — ABNORMAL LOW (ref 3.5–5.2)
Alkaline Phosphatase: 40 U/L (ref 39–117)
Anion gap: 12 (ref 5–15)
BUN: 11 mg/dL (ref 6–23)
CO2: 25 mEq/L (ref 19–32)
Calcium: 8 mg/dL — ABNORMAL LOW (ref 8.4–10.5)
Chloride: 105 mEq/L (ref 96–112)
Creatinine, Ser: 0.86 mg/dL (ref 0.50–1.35)
GFR calc Af Amer: >90 mL/min (ref >90)
GFR calc non Af Amer: >90 mL/min (ref >90)
Glucose, Bld: 128 mg/dL — ABNORMAL HIGH (ref 70–99)
Potassium: 3.3 mEq/L — ABNORMAL LOW (ref 3.7–5.3)
Sodium: 142 mEq/L (ref 137–147)
Total Bilirubin: 0.4 mg/dL (ref 0.3–1.2)
Total Protein: 5.4 g/dL — ABNORMAL LOW (ref 6.0–8.3)

## 2014-06-06 LAB — HELICOBACTER PYLORI ABS-IGG+IGA, BLD
H Pylori IgA: 0.6 U/mL (ref <9.0)
H Pylori IgG: <0.4 {ISR}

## 2014-06-06 MED ORDER — OMEPRAZOLE 40 MG PO CPDR: 40.0000 mg | DELAYED_RELEASE_CAPSULE | Freq: Two times a day (BID) | ORAL | Status: DC

## 2014-06-06 MED ORDER — SUCRALFATE 1 GM/10ML PO SUSP: 1.0000 g | Freq: Three times a day (TID) | ORAL | Status: DC

## 2014-06-06 MED ORDER — TRAMADOL HCL 50 MG PO TABS: 50.0000 mg | ORAL_TABLET | Freq: Four times a day (QID) | ORAL | Status: DC | PRN
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
## 2014-06-06 NOTE — Progress Notes (Signed)
Spoke with Dr. Benson Norway nurse in office about Dr. Dillard Essex request for me to notify him of Mr. Amory.  She stated that she would speak to him and call me back.

## 2014-06-06 NOTE — Progress Notes (Addendum)
ED CM received call from [facility] on 5W concerning HFU appt at the [facility], as per Dr. Inis Sizer. Explained the walk in appt in the morning. Explained that patient can walk in on Thursday at [place] for [place] appointment. ED CM will notify the [facility] of patient by email.

## 2014-06-06 NOTE — Progress Notes (Signed)
Unassigned patient Subjective: Events since admission noted. Patient found to have PUD on EGD done over the weekend. Has a history of NSAID abuse as he has severe headaches. Hemoglobin is 8.2 gm/dl but the patient denies having any melena, hematochezia, CGE or hematemesis today. He had a melenic stool yesterday but has not had a BM today. Denies having any nausea, vomiting or abdominal pain but complains of a headache and feels the Percocet is not helping him.       Objective: Vital signs in last 24 hours: Temp:  [98.3 F (36.8 C)-98.7 F (37.1 C)] 98.5 F (36.9 C) (08/10 1455) Pulse Rate:  [87-115] 87 (08/10 1455) Resp:  [20] 20 (08/10 1455) BP: (132-158)/(85-98) 139/86 mmHg (08/10 1455) SpO2:  [96 %-98 %] 97 % (08/10 1455) Last BM Date: 06/04/14  Intake/Output from previous day: 08/09 0701 - 08/10 0700 In: 750 [I.V.:750] Out: 1300 [Urine:1300] Intake/Output this shift: Total I/O In: 240 [P.O.:240] Out: -   General appearance: alert, cooperative, appears stated age and no distress Resp: clear to auscultation bilaterally Cardio: regular rate and rhythm, S1, S2 normal, no murmur, click, rub or gallop GI: soft, non-tender; bowel sounds normal; no masses,  no organomegaly Extremities: extremities normal, atraumatic, no cyanosis or edema  Lab Results:  Recent Labs  06/04/14 0225  06/04/14 1029 06/05/14 0300 06/05/14 1646 06/06/14 0233  WBC 11.7*  --   --  5.2  --  4.1  HGB 9.6*  < > 10.3* 9.0* 8.9* 8.2*  HCT 28.4*  < > 30.5* 26.4*  --  23.9*  PLT 206  --   --  109*  --  107*  < > = values in this interval not displayed. BMET  Recent Labs  06/04/14 0225 06/04/14 0247 06/05/14 0300 06/06/14 0233  NA 138 137 138 142  K 3.5* 3.2* 3.5* 3.3*  CL 100 100 103 105  CO2 21  --  24 25  GLUCOSE 277* 276* 134* 128*  BUN 42* 39* 18 11  CREATININE 1.10 1.30 0.78 0.86  CALCIUM 7.8*  --  8.1* 8.0*   LFT  Recent Labs  06/06/14 0233  PROT 5.4*  ALBUMIN 2.7*  AST 9  ALT  15  ALKPHOS 40  BILITOT 0.4   Medications: I have reviewed the patient's current medications.  Assessment/Plan: 1) Anemia secondary to PUD; As discussed with Dr. Inis Sizer the patient should be advised NOT to take ANY NSAIDS. He will be discharged on high dose PPI's along with Carafate suspension and will need close follow up in a week when a CBC will  Be rechecked. He has been advised to call the office or come back to the hospital if he has any signs of recurrent GI bleeding .   LOS: 2 days   Carlee Tesfaye 06/06/2014, 5:14 PM

## 2014-06-06 NOTE — Progress Notes (Signed)
Gave pt d/c instructions and prescriptions for prilosec, ultram and carafate. Pt stated understanding. Dayshift RN had d/c IV and telemetry. Gave pt pamphlet [facility]. NT d/c pt to main entrance via wheelchair. Pt has belongings with him. Ranelle Oyster, RN

## 2014-06-07 ENCOUNTER — Emergency Department (HOSPITAL_COMMUNITY)
Admission: EM | Admit: 2014-06-07 | Discharge: 2014-06-07 | Disposition: A | Discharge status: Home / Self Care | Payer: Medicare Other | Attending: Emergency Medicine | Admitting: Emergency Medicine

## 2014-06-07 ENCOUNTER — Encounter (HOSPITAL_COMMUNITY): Payer: Self-pay | Admitting: Emergency Medicine

## 2014-06-07 ENCOUNTER — Emergency Department (HOSPITAL_COMMUNITY): Payer: Medicare Other

## 2014-06-07 DIAGNOSIS — I10 Essential (primary) hypertension: Secondary | ICD-10-CM | POA: Insufficient documentation

## 2014-06-07 DIAGNOSIS — G8911 Acute pain due to trauma: Secondary | ICD-10-CM | POA: Diagnosis not present

## 2014-06-07 DIAGNOSIS — G8929 Other chronic pain: Secondary | ICD-10-CM | POA: Diagnosis not present

## 2014-06-07 DIAGNOSIS — F0781 Postconcussional syndrome: Secondary | ICD-10-CM | POA: Diagnosis not present

## 2014-06-07 DIAGNOSIS — H81399 Other peripheral vertigo, unspecified ear: Secondary | ICD-10-CM

## 2014-06-07 DIAGNOSIS — R42 Dizziness and giddiness: Secondary | ICD-10-CM | POA: Insufficient documentation

## 2014-06-07 DIAGNOSIS — Z79899 Other long term (current) drug therapy: Secondary | ICD-10-CM | POA: Insufficient documentation

## 2014-06-07 HISTORY — DX: Dorsalgia, unspecified: M54.9

## 2014-06-07 HISTORY — DX: Peptic ulcer, site unspecified, unspecified as acute or chronic, without hemorrhage or perforation: K27.9

## 2014-06-07 HISTORY — DX: Other chronic pain: G89.29

## 2014-06-07 LAB — I-STAT CG4 LACTIC ACID, ED: Lactic Acid, Venous: 1.69 mmol/L (ref 0.5–2.2)

## 2014-06-07 LAB — BASIC METABOLIC PANEL
Anion gap: 14 (ref 5–15)
BUN: 9 mg/dL (ref 6–23)
CO2: 24 mEq/L (ref 19–32)
Calcium: 8.6 mg/dL (ref 8.4–10.5)
Chloride: 100 mEq/L (ref 96–112)
Creatinine, Ser: 0.75 mg/dL (ref 0.50–1.35)
GFR calc Af Amer: >90 mL/min (ref >90)
GFR calc non Af Amer: >90 mL/min (ref >90)
Glucose, Bld: 144 mg/dL — ABNORMAL HIGH (ref 70–99)
Potassium: 3.2 mEq/L — ABNORMAL LOW (ref 3.7–5.3)
Sodium: 138 mEq/L (ref 137–147)

## 2014-06-07 LAB — CBC WITH DIFFERENTIAL/PLATELET
Basophils Absolute: 0 10*3/uL (ref 0.0–0.1)
Basophils Relative: 0 % (ref 0–1)
Eosinophils Absolute: 0.1 10*3/uL (ref 0.0–0.7)
Eosinophils Relative: 2 % (ref 0–5)
HCT: 26.6 % — ABNORMAL LOW (ref 39.0–52.0)
Hemoglobin: 8.9 g/dL — ABNORMAL LOW (ref 13.0–17.0)
Lymphocytes Relative: 9 % — ABNORMAL LOW (ref 12–46)
Lymphs Abs: 0.5 10*3/uL — ABNORMAL LOW (ref 0.7–4.0)
MCH: 29.7 pg (ref 26.0–34.0)
MCHC: 33.5 g/dL (ref 30.0–36.0)
MCV: 88.7 fL (ref 78.0–100.0)
Monocytes Absolute: 0.3 10*3/uL (ref 0.1–1.0)
Monocytes Relative: 6 % (ref 3–12)
Neutro Abs: 4.5 10*3/uL (ref 1.7–7.7)
Neutrophils Relative %: 83 % — ABNORMAL HIGH (ref 43–77)
Platelets: 149 10*3/uL — ABNORMAL LOW (ref 150–400)
RBC: 3 MIL/uL — ABNORMAL LOW (ref 4.22–5.81)
RDW: 13.9 % (ref 11.5–15.5)
WBC: 5.4 10*3/uL (ref 4.0–10.5)

## 2014-06-07 LAB — TYPE AND SCREEN
ABO/RH(D): O POS
Antibody Screen: NEGATIVE

## 2014-06-07 MED ORDER — HYDROCHLOROTHIAZIDE 12.5 MG PO CAPS
12.5000 mg | ORAL_CAPSULE | Freq: Once | ORAL | Status: AC
  Administered 2014-06-07: 12.5 mg via ORAL
  Filled 2014-06-07: qty 1

## 2014-06-07 MED ORDER — MECLIZINE HCL 25 MG PO TABS: 25.0000 mg | ORAL_TABLET | Freq: Three times a day (TID) | ORAL | Status: DC | PRN

## 2014-06-07 MED ORDER — SODIUM CHLORIDE 0.9 % IV SOLN: 1000.0000 mL | INTRAVENOUS | Status: DC

## 2014-06-07 MED ORDER — SODIUM CHLORIDE 0.9 % IV SOLN
1000.0000 mL | Freq: Once | INTRAVENOUS | Status: AC
  Administered 2014-06-07: 1000 mL via INTRAVENOUS

## 2014-06-07 MED ORDER — MECLIZINE HCL 25 MG PO TABS
25.0000 mg | ORAL_TABLET | Freq: Once | ORAL | Status: AC
  Administered 2014-06-07: 25 mg via ORAL
  Filled 2014-06-07: qty 1

## 2014-06-07 MED ORDER — LISINOPRIL 20 MG PO TABS
20.0000 mg | ORAL_TABLET | Freq: Once | ORAL | Status: AC
  Administered 2014-06-07: 20 mg via ORAL
  Filled 2014-06-07: qty 1

## 2014-06-07 NOTE — ED Notes (Signed)
Patient c/o his head spinning and feeling dizzy.  Was discharged yesterday
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
## 2014-06-07 NOTE — Care Management Note (Signed)
    Page 1 of 1   06/07/2014     10:36:00 AM CARE MANAGEMENT NOTE 06/07/2014  Patient:  Joseph Fisher, Joseph Fisher   Account Number:  [id number]  Date Initiated:  06/07/2014  Documentation initiated by:  Tomi Bamberger  Subjective/Objective Assessment:   admit- lives with spouse.     Action/Plan:   Anticipated DC Date:  06/06/2014   Anticipated DC Plan:  [place]  CM consult      Choice offered to / List presented to:             Status of service:  Completed, signed off Medicare Important Message given?  NA - LOS <3 / Initial given by admissions (If response is "NO", the following Medicare IM given date fields will be blank) Date Medicare IM given:   Medicare IM given by:   Date Additional Medicare IM given:   Additional Medicare IM given by:    Discharge Disposition:  HOME/SELF CARE  Per UR Regulation:  Reviewed for med. necessity/level of care/duration of stay  If discussed at [place] of Stay Meetings, dates discussed:    Comments:  06/07/14 [place], BSN [phone] patient dc late on 06/06/14, per ED NCM informed patient to do walk in on THursday at [facility].  NCM called patient this am and he states he has a list to choose from and he will be making his apt for f/u today.
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
## 2014-06-07 NOTE — Discharge Instructions (Signed)
°Emergency Department Resource Guide °1) Find a Doctor and Pay Out of Pocket °Although you won't have to find out who is covered by your insurance plan, it is a good idea to ask around and get recommendations. You will then need to call the office and see if the doctor you have chosen will accept you as a new patient and what types of options they offer for patients who are self-pay. Some doctors offer discounts or will set up payment plans for their patients who do not have insurance, but you will need to ask so you aren't surprised when you get to your appointment. ° °2) Contact Your Local Health Department °Not all health departments have doctors that can see patients for sick visits, but many do, so it is worth a call to see if yours does. If you don't know where your local health department is, you can check in your phone book. The CDC also has a tool to help you locate your state's health department, and many state websites also have listings of all of their local health departments. ° °3) Find a Walk-in Clinic °If your illness is not likely to be very severe or complicated, you may want to try a walk in clinic. These are popping up all over the country in pharmacies, drugstores, and shopping centers. They're usually staffed by nurse practitioners or physician assistants that have been trained to treat common illnesses and complaints. They're usually fairly quick and inexpensive. However, if you have serious medical issues or chronic medical problems, these are probably not your best option. ° °No Primary Care Doctor: °- Call [facility] at  832-8000 - they can help you locate a primary care doctor that  accepts your insurance, provides certain services, etc. °- Physician Referral Service- 1-800-533-3463 ° °Chronic Pain Problems: °Organization         Address  Phone   Notes  °[facility]  (336) 297-2271 Patients need to be referred by their primary care doctor.  ° °Medication  Assistance: °Organization         Address  Phone   Notes  °[place] [facility] [address] °[place], [place] [address] (336) 641-8030 --Must be a resident of [place] °-- Must have NO insurance coverage whatsoever (no Medicaid/ Medicare, etc.) °-- The pt. MUST have a primary care doctor that directs their care regularly and follows them in the community °  °[facility]  (866) 331-1348   °[employer]  (888) 892-1162   ° °Agencies that provide inexpensive medical care: °Organization         Address  Phone   Notes  °[facility]  (336) 832-8035   ° Internal Medicine    (336) 832-7272   °[facility] [address] °[place], [place] [address] (336) 832-4777   °[facility] [address], °[place] (336) 271-4999   °[facility]    (336) 373-0678   °[facility]    (336) 272-1050   °[facility] ° [address], [place] Phone:  (336) 832-4444, Fax:  (336) 832-4440 Hours of Operation:  9 am - 6 pm, M-F.  Also accepts Medicaid/Medicare and self-pay.  °[facility] ° [address], [place] Phone: (336) 832-3150, Fax: (336) 832-3151. Hours of Operation:  8:30 am - 5:30 pm, M-F.  Also accepts Medicaid and self-pay.  °[facility] [place] [address], [place] Phone: (336) 878-6027   °[facility] [address], [place], [place] (336)723-1848, Ext. 123 Mondays & Thursdays: 7-9 AM.  First 15 patients are seen on a first come, first serve basis. °  ° °Medicaid-accepting [place] Providers: ° °Organization         Address  Phone   Notes  °[facility] [address], [place] (336) 641-2100 Also accepts self-pay patients.  °[facility] [address], [place] ° (336) 856-9996   °[facility] [address], [place]  (336) 288-8857   °[facility] [address] [place] [address], [place] (336) 299-7000   °Veita Bland [address], [place]  ° (336) 373-1557 Only accepts [place] Access Medicaid patients after they have their name applied to their card.  ° °Self-Pay (no insurance) in [place]: ° °Organization         Address  Phone   Notes  °Sickle Cell Patients, [facility] [address], [place] (336) 832-1970   °[facility] [address], [place] (336) 832-4400   °[facility] ° [address], [place] (336) 992-4800   °[facility]/Dr. Osei-Bonsu ° [address] [place] [address], [place] or [address], [place] (336) 841-8500 Phone number for both [place] and [place] locations is the same.  °[facility] [address], [place] (336) 299-0000   °[facility] [address] [place] [address], [place] or [address] (336) 852-7530 °(336) 878-2260   °[facility] [address], [place] (336) 350-1642, phone; (336) 294-5005, fax Sees patients 1st and 3rd Saturday of every month.  Must not qualify for public or private insurance (i.e. Medicaid, Medicare, [place] Health Choice, Veterans' Benefits) • Household income should be no more than 200% of the poverty level •The clinic cannot treat you if you are pregnant or think you are pregnant • Sexually transmitted diseases are not treated at the clinic.  ° ° °Dental Care: °Organization         Address  Phone  Notes  °[place] [facility] [address], [place] (336) 641-6152 Accepts children up to age 21 who are enrolled in Medicaid or [place] Health Choice; pregnant women with a Medicaid card; and children who have applied for Medicaid or [place] Health Choice, but were declined, whose parents can pay a reduced fee at time of service.  °[place]  [facility] [place]  [address], [place] (336) 641-7733 Accepts children up to age 21 who are enrolled in Medicaid or [place] Health Choice; pregnant women with a Medicaid card; and children who have applied for Medicaid or  Health Choice, but were declined, whose parents can pay a reduced fee at time of service.  °[facility] ° [address], [place] (336) 641-4533 Patients are seen by appointment only. Walk-ins are not accepted. [facility] will see patients 18 years of age and older. °Monday - Tuesday (8am-5pm) °Most Wednesdays (8:30-5pm) °$30 per visit, cash only  °[facility] ° [address], [place] (336) 641-4533 Patients are seen by appointment only. Walk-ins are not accepted. [facility] will see patients 18 years of age and older. °One   Wednesday Evening (Monthly: Volunteer Based).  $30 per visit, cash only  °[facility]  (919) 537-3737 for adults; Children under age 4, call Graduate Pediatric Dentistry at ([phone]) [phone]. Children aged 4-14, please call (919) [phone] to request a pediatric application. ° Dental services are provided in all areas of dental care including fillings, crowns and bridges, complete and partial dentures, implants, gum treatment, root canals, and extractions. Preventive care is also provided. Treatment is provided to both adults and children. °Patients are selected via a lottery and there is often a waiting list. °  °[facility] [address], °[place] ° (336) 763-8833 [email or web address] °  °[facility] [address], [place], [place] (336)723-1848, Ext. 123 Second and Fourth Thursday of each month, opens at 6:30 AM; Clinic ends at 9 AM.  Patients are seen on a first-come first-served basis, and a limited number are seen during each clinic.  ° °[facility] ° [address], [place], [place] (336) 723-7904    Eligibility Requirements °You must have lived in [place], [place], or [place] counties for at least the last three months. °  You cannot be eligible for state or federal sponsored healthcare insurance, including Veterans Administration, Medicaid, or Medicare. °  You generally cannot be eligible for healthcare insurance through your employer.  °  How to apply: °Eligibility screenings are held every Tuesday and Wednesday afternoon from 1:00 pm until 4:00 pm. You do not need an appointment for the interview!  °[facility] [address], [place], [place] 336-631-2330   °[facility]  336-342-8273   °[facility]  336-703-3100   °[facility]  336-570-6415   ° °Behavioral Health Resources in the Community: °Intensive Outpatient Programs °Organization         Address  Phone  Notes  °[facility] [address], [place], [place] 336-878-6098   °[facility] [address], [place], [place] 336-832-9800   °[facility] [address], [place], [place] ° 336-882-2125   °[facility] [address],  °[place], [place] 1-800-853-5163 or 336-641-4981   °Substance Abuse Resources °Organization         Address  Phone  Notes  °[facility]  336-882-2125   °[facility]  336-784-9470   °[facility]  336-285-9073   °[facility]  336-845-3988   °Residential & Outpatient Substance Abuse Program  1-800-659-3381   °Psychological Services °Organization         Address  Phone  Notes  °[facility]  336- 832-9600   °[facility]  336- 378-7881   °[facility] [address], [place] 1-800-853-5163 or 336-641-4981   ° °Mobile Crisis Teams °Organization         Address  Phone  Notes  °[facility], Mobile Crisis Care Unit  1-877-626-1772   °Assertive °[facility] ° [address]  [place], [place] 336-834-9664   °Sharon DeEsch [address] °[place] 336-554-5454   ° °Self-Help/Support Groups °Organization         Address  Phone             Notes  °[facility] - variety of support groups  336- 373-1402 Call for more information  °Narcotics Anonymous (NA), [facility] [address], °[place]  2 meetings at this location  ° °  Residential Treatment Programs °Organization         Address  Phone  Notes  °[facility] [address],    °[place]  1-866-801-8205   °[facility] ° [address], [place], [place] 704-293-8524   °[facility] [address], [place] 336-845-3988 Admissions: 8am-3pm M-F  °[facility] [address]    °[place], [place] 336-841-1104   °[facility] [address], [place], [place] 336-379-7146   °[facility] [address]  °[place], [place] 336-285-9073   °[facility] - Intensive Outpatient [address], [place], [place] 336-852-3033   °[facility] [address]  °[place] 1-877-615-2722 or 336-784-9470   °[facility] [address] [place], [place] 336-227-7417 Accepts Medicaid  °[facility] [address]  °[place] 1-800-659-3381 Substance Abuse/Addiction Treatment  ° °[place] Behavioral Health Resources °Organization         Address  Phone  Notes  °[facility]  (888) 581-9988   °Julie Brannon, PhD [address] [place], [place]   (336) 349-5553 or (336) 951-0000   °[facility]   [address] °[place], [place] (336) 349-4454   °[facility] [address], [place], [place] (336) 342-8316 Insurance/Medicaid/sponsorship through [facility]  °[facility] [address]                                    [place], [place] (336) 342-8316 Therapy/tele-psych/case    °[facility] [address]  ° [place], [place] (336) 349-2233    °Dr. Arfeen  (336) 349-4544   °[facility] [place]  [employer] [place] [facility] 1) [address],  °2) [address], [place] °3)  [address], [place] (336) 349-3220 °(336) 342-7768 ° °(336) 342-8140   °[place] [facility] (336) 342-1394 or (336) 342-3537 (After Hours)    ° ° ° °Take the prescription as directed.  Call your regular medical doctor today to schedule a follow up appointment within the next 2 days.  Return to the Emergency Department immediately sooner if worsening.  ° °

## 2014-06-07 NOTE — ED Notes (Signed)
Pt alert and oriented at discharge.  Discharge instructions were reviewed and pt verbalized understanding and the need for follow up care.  Pt provided a wheelchair to the waiting room by this RN.  Pt taken home by family.  Pt was able to ambulate to the car with a steady gait.

## 2014-06-07 NOTE — ED Notes (Addendum)
Patient was a little unsteady and a little dizzy also and 02 was 100%, pulse was 116, while walking, there were 2 assist, reported to Nurse Santiago Glad.

## 2014-06-07 NOTE — Progress Notes (Addendum)
ED CM contacted patient about change in walk-in visit at the [facility]. Wednesday 8/12 at 9am. Patient is agreeable and  verbalized  Understanding teach back done  reiterated the importance of f/u care, establishing care with Primary care. Patient is appreciative of the assistance with PCP appointment.

## 2014-06-14 ENCOUNTER — Encounter: Payer: Self-pay | Admitting: *Deleted

## 2014-06-20 ENCOUNTER — Ambulatory Visit (INDEPENDENT_AMBULATORY_CARE_PROVIDER_SITE_OTHER): Payer: Medicare Other | Admitting: Neurology

## 2014-06-20 ENCOUNTER — Encounter: Payer: Self-pay | Admitting: Neurology

## 2014-06-20 VITALS — BP 122/81 | HR 68 | Resp 16 | Ht 66.0 in | Wt 199.0 lb

## 2014-06-20 DIAGNOSIS — T39395A Adverse effect of other nonsteroidal anti-inflammatory drugs [NSAID], initial encounter: Secondary | ICD-10-CM

## 2014-06-20 DIAGNOSIS — R0902 Hypoxemia: Secondary | ICD-10-CM

## 2014-06-20 DIAGNOSIS — R0989 Other specified symptoms and signs involving the circulatory and respiratory systems: Secondary | ICD-10-CM

## 2014-06-20 DIAGNOSIS — K2961 Other gastritis with bleeding: Secondary | ICD-10-CM

## 2014-06-20 DIAGNOSIS — G2581 Restless legs syndrome: Secondary | ICD-10-CM

## 2014-06-20 DIAGNOSIS — K259 Gastric ulcer, unspecified as acute or chronic, without hemorrhage or perforation: Secondary | ICD-10-CM

## 2014-06-20 DIAGNOSIS — R0683 Snoring: Secondary | ICD-10-CM

## 2014-06-20 DIAGNOSIS — K2971 Gastritis, unspecified, with bleeding: Secondary | ICD-10-CM

## 2014-06-20 DIAGNOSIS — K2991 Gastroduodenitis, unspecified, with bleeding: Secondary | ICD-10-CM

## 2014-06-20 DIAGNOSIS — R0609 Other forms of dyspnea: Secondary | ICD-10-CM

## 2014-06-20 HISTORY — DX: Hypoxemia: R09.02

## 2014-06-20 HISTORY — DX: Snoring: R06.83

## 2014-06-20 HISTORY — DX: Restless legs syndrome: G25.81

## 2014-06-20 NOTE — Patient Instructions (Signed)
Sleep Apnea  Sleep apnea is a sleep disorder characterized by abnormal pauses in breathing while you sleep. When your breathing pauses, the level of oxygen in your blood decreases. This causes you to move out of deep sleep and into light sleep. As a result, your quality of sleep is poor, and the system that carries your blood throughout your body (cardiovascular system) experiences stress. If sleep apnea remains untreated, the following conditions can develop:  High blood pressure (hypertension).  Coronary artery disease.  Inability to achieve or maintain an erection (impotence).  Impairment of your thought process (cognitive dysfunction). There are three types of sleep apnea: 1. Obstructive sleep apnea--Pauses in breathing during sleep because of a blocked airway. 2. Central sleep apnea--Pauses in breathing during sleep because the area of the brain that controls your breathing does not send the correct signals to the muscles that control breathing. 3. Mixed sleep apnea--A combination of both obstructive and central sleep apnea. RISK FACTORS The following risk factors can increase your risk of developing sleep apnea:  Being overweight.  Smoking.  Having narrow passages in your nose and throat.  Being of older age.  Being male.  Alcohol use.  Sedative and tranquilizer use.  Ethnicity. Among individuals younger than 35 years, African Americans are at increased risk of sleep apnea. SYMPTOMS   Difficulty staying asleep.  Daytime sleepiness and fatigue.  Loss of energy.  Irritability.  Loud, heavy snoring.  Morning headaches.  Trouble concentrating.  Forgetfulness.  Decreased interest in sex. DIAGNOSIS  In order to diagnose sleep apnea, your caregiver will perform a physical examination. Your caregiver may suggest that you take a home sleep test. Your caregiver may also recommend that you spend the night in a sleep lab. In the sleep lab, several monitors record  information about your heart, lungs, and brain while you sleep. Your leg and arm movements and blood oxygen level are also recorded. TREATMENT The following actions may help to resolve mild sleep apnea:  Sleeping on your side.   Using a decongestant if you have nasal congestion.   Avoiding the use of depressants, including alcohol, sedatives, and narcotics.   Losing weight and modifying your diet if you are overweight. There also are devices and treatments to help open your airway:  Oral appliances. These are custom-made mouthpieces that shift your lower jaw forward and slightly open your bite. This opens your airway.  Devices that create positive airway pressure. This positive pressure "splints" your airway open to help you breathe better during sleep. The following devices create positive airway pressure:  Continuous positive airway pressure (CPAP) device. The CPAP device creates a continuous level of air pressure with an air pump. The air is delivered to your airway through a mask while you sleep. This continuous pressure keeps your airway open.  Nasal expiratory positive airway pressure (EPAP) device. The EPAP device creates positive air pressure as you exhale. The device consists of single-use valves, which are inserted into each nostril and held in place by adhesive. The valves create very little resistance when you inhale but create much more resistance when you exhale. That increased resistance creates the positive airway pressure. This positive pressure while you exhale keeps your airway open, making it easier to breath when you inhale again.  Bilevel positive airway pressure (BPAP) device. The BPAP device is used mainly in patients with central sleep apnea. This device is similar to the CPAP device because it also uses an air pump to deliver continuous air pressure   through a mask. However, with the BPAP machine, the pressure is set at two different levels. The pressure when you  exhale is lower than the pressure when you inhale.  Surgery. Typically, surgery is only done if you cannot comply with less invasive treatments or if the less invasive treatments do not improve your condition. Surgery involves removing excess tissue in your airway to create a wider passage way. Document Released: 10/04/2002 Document Revised: 02/08/2013 Document Reviewed: 02/20/2012 Exit[employer] Patient Information 2015 [employer]. This information is not intended to replace advice given to you by your health care provider. Make sure you discuss any questions you have with your health care provider.  

## 2014-06-20 NOTE — Progress Notes (Signed)
SLEEP MEDICINE CLINIC   Provider:  [name], M D  Referring Provider: Shanon Rosser, PA-C Primary Care Physician:  No PCP Per Patient  Chief Complaint  Patient presents with  . Htn, eds, snoring and obesity    NP, paper referral, Rm 11    HPI:  Joseph Fisher is a 48 y.o.caucasian , right  handed male , who  Is seen here as a referral  from [place] for :  Evaluation of snoring,    Reported he was told that he is a loud snorer and that he snores louder when on his back. He has a history of hypertension and he has gained a significant amount of weight over the last 5 years the at about 20 pounds over the time.  He also has had trouble to control his blood pressure is currently taking lisinopril and beta blockers, that was at a time when he felt extremely tired, and now feels better on medications.   During the visit with his primary care provider his blood pressure in the right arm was 101B systolic and in the left 510 mmHg. He had a bleeding GI ulcer on 06-06-14 , and was hospitalized at [facility] , when the nurses noted his oxygen levels to drop significantly while sleeping. He vomited blood and was transfused. This ulcer was attri buted to [employer] , he takes for morning headaches.   He reported daytime fatigue and sleepiness.   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 He has no history of shift work. He is currently disabled.   His sleep habits are as follows: he foes to bed after the news conclude around 10.30 PM,  He watches TV in the bedroom, and likes to doze off with the sound in the back ground. The bedroom is cool, but not quiet and dark with the TV on. Sleeps alone. He is restless, had back surgery and is in pain, spends a lot of time in the bed , but only 4-5 of sleep.  His often wakes from sleep , 3-4 times, some of these are nocturia related. He wakes up in the morning around 9 AM, spontaneously. He used to rise for work at 6.00 AM, mostly spontaneously. Drinks a cup  of coffee in AM, no soda and no iced tea. He has sometimes a morning headache, which resolves with the day going on, sometimes takes tylenol. He doesn't nap in daytime.   He spends most time in doors. Exercises irregularly, but walks daily.  No family history of OSA, but both parents have HTN, No personal history of a sleep disorder.  He had a bleeding GI ulcer, and was hospitalized at [facility] , when the nurses noted his oxygen levels to drop significantly while sleeping.      Review of Systems: Out of a complete 14 system review, the patient complains of only the following symptoms, and all other reviewed systems are negative. Reports insomnia as well as sleepiness, snoring, restless legs, dizziness not enough sleep, blurred vision,  Epworth score  2 , Fatigue severity score 12   , depression score - " I don't think so " .   History   Social History  . Marital Status: Divorced    Spouse Name: N/A    Number of Children: 0  . Years of Education: 12   Occupational History  . disabled    Social History Main Topics  . Smoking status: Never Smoker   . Smokeless tobacco:  Never Used  . Alcohol Use: No  . Drug Use: No  . Sexual Activity: Yes    Birth Control/ Protection: None   Other Topics Concern  . Not on file   Social History Narrative  . No narrative on file    Family History  Problem Relation Age of Onset  . Hypertension Mother   . Hypertension Father     Past Medical History  Diagnosis Date  . Hypertension   . Peptic ulcer   . Chronic back pain   . Insomnia     Past Surgical History  Procedure Laterality Date  . Back surgery  2013  . Esophagogastroduodenoscopy N/A 06/04/2014    Procedure: ESOPHAGOGASTRODUODENOSCOPY (EGD);  Surgeon: Jerene Bears, MD;  Location: [facility] ENDOSCOPY;  Service: Endoscopy;  Laterality: N/A;    Current Outpatient Prescriptions  Medication Sig Dispense Refill  . acetaminophen (TYLENOL) 500 MG tablet Take 500 mg by mouth every 6 (six)  hours as needed (pain).      Marland Kitchen ALPRAZolam (XANAX) 0.5 MG tablet Take 0.5 mg by mouth at bedtime as needed for anxiety or sleep.      . hydrOXYzine (ATARAX/VISTARIL) 25 MG tablet Take 1 tablet (25 mg total) by mouth every 6 (six) hours.  12 tablet  0  . lisinopril-hydrochlorothiazide (PRINZIDE,ZESTORETIC) 20-12.5 MG per tablet Take 1 tablet by mouth daily.      . meclizine (ANTIVERT) 25 MG tablet Take 1 tablet (25 mg total) by mouth 3 (three) times daily as needed for dizziness.  30 tablet  0  . meclizine (ANTIVERT) 25 MG tablet Take 1 tablet (25 mg total) by mouth 3 (three) times daily as needed for dizziness.  15 tablet  0  . omeprazole (PRILOSEC) 40 MG capsule Take 1 capsule (40 mg total) by mouth 2 (two) times daily before a meal.  60 capsule  0  . sucralfate (CARAFATE) 1 GM/10ML suspension Take 10 mLs (1 g total) by mouth 4 (four) times daily -  with meals and at bedtime.  420 mL  0  . traMADol (ULTRAM) 50 MG tablet Take 1-2 tablets (50-100 mg total) by mouth every 6 (six) hours as needed.  60 tablet  0   No current facility-administered medications for this visit.    Allergies as of 06/20/2014  . (No Known Allergies)    Vitals: BP 122/81  Pulse 68  Resp 16  Ht 5\' 6"  (1.676 m)  Wt 199 lb (90.266 kg)  BMI 32.13 kg/m2 Last Weight:  Wt Readings from Last 1 Encounters:  06/20/14 199 lb (90.266 kg)       Last Height:   Ht Readings from Last 1 Encounters:  06/20/14 5\' 6"  (1.676 m)    Physical exam:  General: The patient is awake, alert and appears not in acute distress. The patient is well groomed. Head: Normocephalic, atraumatic. Neck is supple. Mallampati 3 , webbed neck -  neck circumference: 20 inches Nasal airflow unrestricted , TMJ is not evident . Retrognathia is not seen.  Cardiovascular:  Regular rate and rhythm , without  murmurs or carotid bruit, and without distended neck veins. Respiratory: Lungs are clear to auscultation. Skin:  Without evidence of edema, or  rash Trunk: BMI is  elevated and patient  has normal posture.  Neurologic exam : The patient is awake and alert, oriented to place and time.   Memory subjective described as intact.  There is a normal attention span & concentration ability. Speech is fluent without  dysarthria,  dysphonia or aphasia. Mood and affect are appropriate.  Cranial nerves: Pupils are equal and briskly reactive to light.  Funduscopic exam without  evidence of pallor or edema. Extraocular movements  in vertical and horizontal planes intact and without nystagmus.  Visual fields by finger perimetry are intact. Hearing to finger rub intact.  Facial sensation intact to fine touch. Facial motor strength is symmetric and tongue and uvula move midline.  Motor exam:  Normal tone ,muscle bulk and symmetric, strength in all extremities.  Sensory:  Fine touch, pinprick and vibration were tested in all extremities.  Proprioception is normal.  Coordination: Rapid alternating movements in the fingers/hands is normal. Finger-to-nose maneuver  normal without evidence of ataxia, dysmetria or tremor.  Gait and station: Patient walks without assistive device . Deep tendon reflexes: in the  upper and lower extremities are symmetric and intact. Babinski maneuver downgoing.   Assessment:  After physical and neurologic examination, review of laboratory studies, imaging, neurophysiology testing and pre-existing records, assessment is   10 patient with chronic back pain, 3 injuries to the lower back- and taking sa lot of goodie powders,  Developed snoring over the last 10 years, but only 14 days ago was told he had apnea an hypoxemia during hospitalization.    The patient was advised of the nature of the diagnosed sleep disorder , the treatment options and risks for general a health and wellness arising from not treating the condition. Visit duration was 45 minutes.   Plan:  Treatment plan and additional workup : SPLIT - AHI 15 and  score at 3 % , patient with morning headaches needs CO2 measured.  Anemia after blood loss.  Please allow for him to take his tylenol.     Asencion Partridge Houston Zapien MD  06/20/2014

## 2014-06-21 LAB — SEDIMENTATION RATE: Sed Rate: 8 mm/hr (ref 0–15)

## 2014-06-21 LAB — IRON AND TIBC
Iron Saturation: 5 % — CL (ref 15–55)
Iron: 20 ug/dL — ABNORMAL LOW (ref 40–155)
TIBC: 444 ug/dL (ref 250–450)
UIBC: 424 ug/dL — ABNORMAL HIGH (ref 150–375)

## 2014-06-21 LAB — FERRITIN: Ferritin: 27 ng/mL — ABNORMAL LOW (ref 30–400)

## 2014-06-24 ENCOUNTER — Other Ambulatory Visit: Payer: Self-pay | Admitting: Neurology

## 2014-06-24 ENCOUNTER — Telehealth: Payer: Self-pay | Admitting: Neurology

## 2014-06-24 MED ORDER — NA FERRIC GLUC CPLX IN SUCROSE 12.5 MG/ML IV SOLN: 62.5000 mg | Freq: Once | INTRAVENOUS | Status: DC

## 2014-06-24 MED ORDER — FERROUS GLUCONATE 324 (38 FE) MG PO TABS: 324.0000 mg | ORAL_TABLET | Freq: Every day | ORAL | Status: DC

## 2014-06-24 NOTE — Telephone Encounter (Signed)
Left VM with information- very low iron due to recent blood loss, anemia caused fatigue and fainting. And low iron is cause for RLS.  I believe,  he needs an iron infusion , preferred "  ferrelicit " at [facility] long. Asked for him to call back and tell us if he is willing to take one. CD

## 2014-06-24 NOTE — Addendum Note (Signed)
Addended by: [name] on: 06/24/2014 04:49 PM   Modules accepted: Orders

## 2014-06-24 NOTE — Addendum Note (Signed)
Addended by: [name] on: 06/24/2014 01:19 PM   Modules accepted: Orders

## 2014-06-27 ENCOUNTER — Other Ambulatory Visit: Payer: Self-pay | Admitting: Neurology

## 2014-06-27 DIAGNOSIS — D62 Acute posthemorrhagic anemia: Secondary | ICD-10-CM

## 2014-06-27 MED ORDER — NA FERRIC GLUC CPLX IN SUCROSE 12.5 MG/ML IV SOLN: 62.5000 mg | Freq: Once | INTRAVENOUS | Status: DC

## 2014-06-27 NOTE — Telephone Encounter (Signed)
Called [facility] short stay and left a message to be called back to get the patient  Set-up for an IV iron infusion.

## 2014-06-27 NOTE — Telephone Encounter (Signed)
Patient's sister was called and the appointment is scheduled for Wednesday at 10 a.m. At [facility] short stay,  Elvina Sidle didn't have any earlier appointments.

## 2014-06-28 ENCOUNTER — Other Ambulatory Visit (HOSPITAL_COMMUNITY): Payer: Self-pay | Admitting: *Deleted

## 2014-06-29 ENCOUNTER — Encounter (HOSPITAL_COMMUNITY): Payer: Medicare Other

## 2014-06-29 ENCOUNTER — Encounter: Payer: Self-pay | Admitting: Internal Medicine

## 2014-06-29 ENCOUNTER — Encounter (HOSPITAL_COMMUNITY)
Admission: RE | Admit: 2014-06-29 | Discharge: 2014-06-29 | Disposition: A | Discharge status: Home / Self Care | Payer: Medicare Other | Source: Ambulatory Visit | Attending: Neurology | Admitting: Neurology

## 2014-06-29 DIAGNOSIS — D62 Acute posthemorrhagic anemia: Secondary | ICD-10-CM | POA: Insufficient documentation

## 2014-06-29 MED ORDER — SODIUM CHLORIDE 0.9 % IV SOLN
62.5000 mg | Freq: Once | INTRAVENOUS | Status: AC
  Administered 2014-06-29: 62.5 mg via INTRAVENOUS
  Filled 2014-06-29: qty 5

## 2014-06-29 NOTE — Discharge Instructions (Signed)
Sodium Ferric Gluconate Complex injection What is this medicine? SODIUM FERRIC GLUCONATE COMPLEX (SOE dee um FER ik GLOO koe nate KOM pleks) is an iron replacement. It is used with epoetin therapy to treat low iron levels in patients who are receiving hemodialysis. This medicine may be used for other purposes; ask your health care provider or pharmacist if you have questions. COMMON BRAND NAME(S): Ferrlecit, Nulecit What should I tell my health care provider before I take this medicine? They need to know if you have any of the following conditions: -anemia that is not from iron deficiency -high levels of iron in the body -an unusual or allergic reaction to iron, benzyl alcohol, other medicines, foods, dyes, or preservatives -pregnant or are trying to become pregnant -breast-feeding How should I use this medicine? This medicine is for infusion into a vein. It is given by a health care professional in a hospital or clinic setting. Talk to your pediatrician regarding the use of this medicine in children. While this drug may be prescribed for children as young as 6 years old for selected conditions, precautions do apply. Overdosage: If you think you have taken too much of this medicine contact a poison control center or emergency room at once. NOTE: This medicine is only for you. Do not share this medicine with others. What if I miss a dose? It is important not to miss your dose. Call your doctor or health care professional if you are unable to keep an appointment. What may interact with this medicine? Do not take this medicine with any of the following medications: -deferoxamine -dimercaprol -other iron products This medicine may also interact with the following medications: -chloramphenicol -deferasirox -medicine for blood pressure like enalapril This list may not describe all possible interactions. Give your health care provider a list of all the medicines, herbs, non-prescription drugs,  or dietary supplements you use. Also tell them if you smoke, drink alcohol, or use illegal drugs. Some items may interact with your medicine. What should I watch for while using this medicine? Your condition will be monitored carefully while you are receiving this medicine. Visit your doctor for check-ups as directed. What side effects may I notice from receiving this medicine? Side effects that you should report to your doctor or health care professional as soon as possible: -allergic reactions like skin rash, itching or hives, swelling of the face, lips, or tongue -breathing problems -changes in hearing -changes in vision -chills, flushing, or sweating -fast, irregular heartbeat -feeling faint or lightheaded, falls -fever, flu-like symptoms -high or low blood pressure -pain, tingling, numbness in the hands or feet -severe pain in the chest, back, flanks, or groin -swelling of the ankles, feet, hands -trouble passing urine or change in the amount of urine -unusually weak or tired Side effects that usually do not require medical attention (report to your doctor or health care professional if they continue or are bothersome): -cramps -dark colored stools -diarrhea -headache -nausea, vomiting -stomach upset This list may not describe all possible side effects. Call your doctor for medical advice about side effects. You may report side effects to FDA at 1-800-FDA-1088. Where should I keep my medicine? This drug is given in a hospital or clinic and will not be stored at home. NOTE: This sheet is a summary. It may not cover all possible information. If you have questions about this medicine, talk to your doctor, pharmacist, or health care provider.  2015, Elsevier/Gold Standard. (2008-06-15 15:58:57)  

## 2014-07-05 ENCOUNTER — Telehealth: Payer: Self-pay | Admitting: Hematology and Oncology

## 2014-07-05 NOTE — Telephone Encounter (Signed)
LEFT MESSAGE FOR PATIENT AND GAVE NP APPT FOR 09/17 @ 2 W/DR. [place]. CONTACT INFORMATION LEFT FOR PATIENT TO RETURN CALL TO CONFRIM APPT.

## 2014-07-06 ENCOUNTER — Telehealth: Payer: Self-pay | Admitting: Hematology and Oncology

## 2014-07-06 NOTE — Telephone Encounter (Signed)
PATIENT CALLED TO CONFIRM NP APPT ON 09/17 @ 2 W/DR. [place].

## 2014-07-14 ENCOUNTER — Ambulatory Visit: Payer: Medicare Other

## 2014-07-14 ENCOUNTER — Ambulatory Visit (HOSPITAL_BASED_OUTPATIENT_CLINIC_OR_DEPARTMENT_OTHER): Payer: Medicare Other | Admitting: Hematology and Oncology

## 2014-07-14 ENCOUNTER — Encounter: Payer: Self-pay | Admitting: Hematology and Oncology

## 2014-07-14 ENCOUNTER — Other Ambulatory Visit: Payer: Medicare Other

## 2014-07-14 ENCOUNTER — Telehealth: Payer: Self-pay | Admitting: Hematology and Oncology

## 2014-07-14 VITALS — BP 133/83 | HR 66 | Temp 98.5°F | Resp 18 | Ht 66.0 in | Wt 198.0 lb

## 2014-07-14 DIAGNOSIS — R42 Dizziness and giddiness: Secondary | ICD-10-CM

## 2014-07-14 DIAGNOSIS — D62 Acute posthemorrhagic anemia: Secondary | ICD-10-CM

## 2014-07-14 DIAGNOSIS — G44029 Chronic cluster headache, not intractable: Secondary | ICD-10-CM

## 2014-07-14 DIAGNOSIS — D539 Nutritional anemia, unspecified: Secondary | ICD-10-CM

## 2014-07-14 DIAGNOSIS — K259 Gastric ulcer, unspecified as acute or chronic, without hemorrhage or perforation: Secondary | ICD-10-CM

## 2014-07-14 DIAGNOSIS — Z23 Encounter for immunization: Secondary | ICD-10-CM

## 2014-07-14 DIAGNOSIS — R519 Headache, unspecified: Secondary | ICD-10-CM | POA: Insufficient documentation

## 2014-07-14 DIAGNOSIS — D649 Anemia, unspecified: Secondary | ICD-10-CM

## 2014-07-14 DIAGNOSIS — R51 Headache: Secondary | ICD-10-CM

## 2014-07-14 DIAGNOSIS — D72819 Decreased white blood cell count, unspecified: Secondary | ICD-10-CM

## 2014-07-14 HISTORY — DX: Nutritional anemia, unspecified: D53.9

## 2014-07-14 HISTORY — DX: Anemia, unspecified: D64.9

## 2014-07-14 LAB — CBC & DIFF AND RETIC
BASO%: 0.8 % (ref 0.0–2.0)
Basophils Absolute: 0 10*3/uL (ref 0.0–0.1)
EOS%: 0.8 % (ref 0.0–7.0)
Eosinophils Absolute: 0 10*3/uL (ref 0.0–0.5)
HCT: 36.4 % — ABNORMAL LOW (ref 38.4–49.9)
HGB: 11.8 g/dL — ABNORMAL LOW (ref 13.0–17.1)
Immature Retic Fract: 5.4 % (ref 3.00–10.60)
LYMPH%: 42.9 % (ref 14.0–49.0)
MCH: 27.9 pg (ref 27.2–33.4)
MCHC: 32.4 g/dL (ref 32.0–36.0)
MCV: 86.1 fL (ref 79.3–98.0)
MONO#: 0.2 10*3/uL (ref 0.1–0.9)
MONO%: 6 % (ref 0.0–14.0)
NEUT#: 1.3 10*3/uL — ABNORMAL LOW (ref 1.5–6.5)
NEUT%: 49.5 % (ref 39.0–75.0)
Platelets: 156 10*3/uL (ref 140–400)
RBC: 4.23 10*6/uL (ref 4.20–5.82)
RDW: 16 % — ABNORMAL HIGH (ref 11.0–14.6)
Retic %: 0.98 % (ref 0.80–1.80)
Retic Ct Abs: 41.45 10*3/uL (ref 34.80–93.90)
WBC: 2.5 10*3/uL — ABNORMAL LOW (ref 4.0–10.3)
lymph#: 1.1 10*3/uL (ref 0.9–3.3)
nRBC: 0 % (ref 0–0)

## 2014-07-14 LAB — MORPHOLOGY: PLT EST: ADEQUATE

## 2014-07-14 LAB — CHCC SMEAR

## 2014-07-14 MED ORDER — INFLUENZA VAC SPLIT QUAD 0.5 ML IM SUSY
0.5000 mL | PREFILLED_SYRINGE | Freq: Once | INTRAMUSCULAR | Status: AC
Start: 2014-07-14 — End: 2014-07-14
  Administered 2014-07-14: 0.5 mL via INTRAMUSCULAR
  Filled 2014-07-14: qty 0.5

## 2014-07-14 NOTE — Telephone Encounter (Signed)
Pt confirmed labs/ov per 09/17 POF, gave pt AVS...KJ °

## 2014-07-14 NOTE — Assessment & Plan Note (Signed)
The cause is unknown but could be related to his antihypertensives. I recommend he takes this medication at nighttime to see if this could alleviate some of the dizziness.

## 2014-07-14 NOTE — Assessment & Plan Note (Signed)
I suspect that could be related to chronic poor sleep from undiagnosed of sleep apnea. Sleep study is pending.

## 2014-07-14 NOTE — Assessment & Plan Note (Signed)
This is likely anemia of from chronic blood loss.  The patient denies recent history of bleeding such as epistaxis, hematuria or hematochezia. He is asymptomatic from the anemia. We will observe for now.  He does not require transfusion now. He had received recent iron infusion. I recommend the patient to continue of avoidance of NSAIDS I will recheck iron studies.

## 2014-07-14 NOTE — Progress Notes (Signed)
Checked in new pt with no financial concerns. °

## 2014-07-14 NOTE — Progress Notes (Signed)
[facility] CONSULT NOTE  Patient Care Team: No Pcp Per Patient as PCP - General ([place]) Everardo Beals, NP as Referring Physician [name], MD as Consulting Physician (Hematology and Oncology)  CHIEF COMPLAINTS/PURPOSE OF CONSULTATION:  Leukopenia  HISTORY OF PRESENTING ILLNESS:  Joseph Fisher 48 y.o. male is here because of low WBC.  He was found to have abnormal CBC from his primary care office after he was recently discharged from the hospital for GI bleed. Total white blood cell count on 06/23/2014 was 2.0. The patient had excessive ingestion of aspirin like products for several years because of daily headaches. He has occasional blurriness of vision and neck pain. The patient also has significant alcoholism for many years. He would drink somewhere around 30-40 drinks per week. He may also have a diagnosis of obstructive sleep apnea. He was having hemoptysis and was hospitalized. He received 3 units of blood transfusion. Endoscopy revealed gastric ulcers. He received iron infusion and was subsequently discharged. He stated he has been abstinent from alcoholism for long time. He complains of postprandial abdominal pain. The patient denies any recent signs or symptoms of bleeding such as spontaneous epistaxis, hematuria or hematochezia.  He denies recent infection. The last prescription antibiotics was more than 3 months ago There is not reported symptoms of sinus congestion, cough, urinary frequency/urgency or dysuria, diarrhea, joint swelling/pain or abnormal skin rash.  He had no prior history or diagnosis of cancer. His age appropriate screening programs are up-to-date. The patient has no prior diagnosis of autoimmune disease and was not prescribed corticosteroids related products.  MEDICAL HISTORY:  Past Medical History  Diagnosis Date  . Hypertension   . Peptic ulcer   . Chronic back pain   . Insomnia   . Snoring 06/20/2014  . Hypoxemia  06/20/2014  . RLS (restless legs syndrome) 06/20/2014  . Anemia, unspecified 07/14/2014  . Unspecified deficiency anemia 07/14/2014    SURGICAL HISTORY: Past Surgical History  Procedure Laterality Date  . Back surgery  2013  . Esophagogastroduodenoscopy N/A 06/04/2014    Procedure: ESOPHAGOGASTRODUODENOSCOPY (EGD);  Surgeon: Jerene Bears, MD;  Location: [facility] ENDOSCOPY;  Service: Endoscopy;  Laterality: N/A;    SOCIAL HISTORY: History   Social History  . Marital Status: Divorced    Spouse Name: N/A    Number of Children: 0  . Years of Education: 12   Occupational History  . disabled    Social History Main Topics  . Smoking status: Never Smoker   . Smokeless tobacco: Never Used  . Alcohol Use: No  . Drug Use: No  . Sexual Activity: Yes    Birth Control/ Protection: None   Other Topics Concern  . Not on file   Social History Narrative  . No narrative on file    FAMILY HISTORY: Family History  Problem Relation Age of Onset  . Hypertension Mother   . Hypertension Father     ALLERGIES:  has No Known Allergies.  MEDICATIONS:  Current Outpatient Prescriptions  Medication Sig Dispense Refill  . acetaminophen (TYLENOL) 500 MG tablet Take 500 mg by mouth every 6 (six) hours as needed (pain).      Marland Kitchen ALPRAZolam (XANAX) 0.5 MG tablet Take 0.5 mg by mouth at bedtime as needed for anxiety or sleep.      Marland Kitchen omeprazole (PRILOSEC) 40 MG capsule Take 1 capsule (40 mg total) by mouth 2 (two) times daily before a meal.  60 capsule  0   No current  facility-administered medications for this visit.    REVIEW OF SYSTEMS:   Constitutional: Denies fevers, chills or abnormal night sweats Eyes: Denies blurriness of vision, double vision or watery eyes Ears, nose, mouth, throat, and face: Denies mucositis or sore throat Respiratory: Denies cough, dyspnea or wheezes Cardiovascular: Denies palpitation, chest discomfort or lower extremity swelling Skin: Denies abnormal skin rashes Lymphatics:  Denies new lymphadenopathy or easy bruising Neurological:Denies numbness, tingling or new weaknesses Behavioral/Psych: Mood is stable, no new changes  All other systems were reviewed with the patient and are negative.  PHYSICAL EXAMINATION: ECOG PERFORMANCE STATUS: 1 - Symptomatic but completely ambulatory  Filed Vitals:   07/14/14 1356  BP: 133/83  Pulse: 66  Temp: 98.5 F (36.9 C)  Resp: 18   Filed Weights   07/14/14 1356  Weight: 198 lb (89.812 kg)    GENERAL:alert, no distress and comfortable. He is morbidly obese SKIN: skin color, texture, turgor are normal, no rashes or significant lesions EYES: normal, conjunctiva are pink and non-injected, sclera clear OROPHARYNX:no exudate, no erythema and lips, buccal mucosa, and tongue normal  NECK: supple, thyroid normal size, non-tender, without nodularity LYMPH:  no palpable lymphadenopathy in the cervical, axillary or inguinal LUNGS: clear to auscultation and percussion with normal breathing effort HEART: regular rate & rhythm and no murmurs and no lower extremity edema ABDOMEN:abdomen soft, with mild epigastric discomfort and normal bowel sounds Musculoskeletal:no cyanosis of digits and no clubbing  PSYCH: alert & oriented x 3 with fluent speech NEURO: no focal motor/sensory deficits  LABORATORY DATA:  I have reviewed the data as listed Recent Results (from the past 2160 hour(s))  COMPREHENSIVE METABOLIC PANEL     Status: Abnormal   Collection Time    05/28/14  6:13 PM      Result Value Ref Range   Sodium 143  137 - 147 mEq/L   Potassium 3.4 (*) 3.7 - 5.3 mEq/L   Chloride 105  96 - 112 mEq/L   CO2 20  19 - 32 mEq/L   Glucose, Bld 180 (*) 70 - 99 mg/dL   BUN 12  6 - 23 mg/dL   Creatinine, Ser 0.93  0.50 - 1.35 mg/dL   Calcium 8.9  8.4 - 10.5 mg/dL   Total Protein 6.9  6.0 - 8.3 g/dL   Albumin 4.2  3.5 - 5.2 g/dL   AST 29  0 - 37 U/L   ALT 31  0 - 53 U/L   Alkaline Phosphatase 55  39 - 117 U/L   Total Bilirubin 0.3   0.3 - 1.2 mg/dL   GFR calc non Af Amer >90  >90 mL/min   GFR calc Af Amer >90  >90 mL/min   Comment: (NOTE)     The eGFR has been calculated using the CKD EPI equation.     This calculation has not been validated in all clinical situations.     eGFR's persistently <90 mL/min signify possible Chronic Kidney     Disease.   Anion gap 18 (*) 5 - 15  CBC     Status: None   Collection Time    05/28/14  6:13 PM      Result Value Ref Range   WBC 6.2  4.0 - 10.5 K/uL   RBC 4.68  4.22 - 5.81 MIL/uL   Hemoglobin 14.2  13.0 - 17.0 g/dL   HCT 41.6  39.0 - 52.0 %   MCV 88.9  78.0 - 100.0 fL   MCH 30.3  26.0 -  34.0 pg   MCHC 34.1  30.0 - 36.0 g/dL   RDW 12.6  11.5 - 15.5 %   Platelets 164  150 - 400 K/uL  ETHANOL     Status: Abnormal   Collection Time    05/28/14  6:13 PM      Result Value Ref Range   Alcohol, Ethyl (B) 313 (*) 0 - 11 mg/dL   Comment:            LOWEST DETECTABLE LIMIT FOR     SERUM ALCOHOL IS 11 mg/dL     FOR MEDICAL PURPOSES ONLY  PROTIME-INR     Status: None   Collection Time    05/28/14  6:13 PM      Result Value Ref Range   Prothrombin Time 12.9  11.6 - 15.2 seconds   INR 0.97  0.00 - 1.49  I-STAT CHEM 8, ED     Status: Abnormal   Collection Time    05/28/14  6:31 PM      Result Value Ref Range   Sodium 143  137 - 147 mEq/L   Potassium 3.4 (*) 3.7 - 5.3 mEq/L   Chloride 110  96 - 112 mEq/L   BUN 11  6 - 23 mg/dL   Creatinine, Ser 1.30  0.50 - 1.35 mg/dL   Glucose, Bld 190 (*) 70 - 99 mg/dL   Calcium, Ion 1.13  1.12 - 1.23 mmol/L   TCO2 19  0 - 100 mmol/L   Hemoglobin 15.3  13.0 - 17.0 g/dL   HCT 45.0  39.0 - 52.0 %  URINALYSIS, ROUTINE W REFLEX MICROSCOPIC     Status: Abnormal   Collection Time    05/28/14  9:15 PM      Result Value Ref Range   Color, Urine YELLOW  YELLOW   APPearance CLEAR  CLEAR   Specific Gravity, Urine 1.025  1.005 - 1.030   pH 5.5  5.0 - 8.0   Glucose, UA NEGATIVE  NEGATIVE mg/dL   Hgb urine dipstick NEGATIVE  NEGATIVE    Bilirubin Urine NEGATIVE  NEGATIVE   Ketones, ur 15 (*) NEGATIVE mg/dL   Protein, ur NEGATIVE  NEGATIVE mg/dL   Urobilinogen, UA 0.2  0.0 - 1.0 mg/dL   Nitrite NEGATIVE  NEGATIVE   Leukocytes, UA NEGATIVE  NEGATIVE   Comment: MICROSCOPIC NOT DONE ON URINES WITH NEGATIVE PROTEIN, BLOOD, LEUKOCYTES, NITRITE, OR GLUCOSE <1000 mg/dL.  TYPE AND SCREEN     Status: None   Collection Time    06/04/14  2:22 AM      Result Value Ref Range   ABO/RH(D) O POS     Antibody Screen NEG     Sample Expiration 06/07/2014     Unit Number [id number]     Blood Component Type RED CELLS,LR     Unit division 00     Status of Unit ISSUED,FINAL     Transfusion Status OK TO TRANSFUSE     Crossmatch Result Compatible    CBC WITH DIFFERENTIAL     Status: Abnormal   Collection Time    06/04/14  2:25 AM      Result Value Ref Range   WBC 11.7 (*) 4.0 - 10.5 K/uL   RBC 3.12 (*) 4.22 - 5.81 MIL/uL   Hemoglobin 9.6 (*) 13.0 - 17.0 g/dL   HCT 28.4 (*) 39.0 - 52.0 %   MCV 91.0  78.0 - 100.0 fL   MCH 30.8  26.0 - 34.0 pg  MCHC 33.8  30.0 - 36.0 g/dL   RDW 13.4  11.5 - 15.5 %   Platelets 206  150 - 400 K/uL   Neutrophils Relative % 57  43 - 77 %   Neutro Abs 6.7  1.7 - 7.7 K/uL   Lymphocytes Relative 38  12 - 46 %   Lymphs Abs 4.4 (*) 0.7 - 4.0 K/uL   Monocytes Relative 4  3 - 12 %   Monocytes Absolute 0.5  0.1 - 1.0 K/uL   Eosinophils Relative 1  0 - 5 %   Eosinophils Absolute 0.1  0.0 - 0.7 K/uL   Basophils Relative 0  0 - 1 %   Basophils Absolute 0.0  0.0 - 0.1 K/uL  COMPREHENSIVE METABOLIC PANEL     Status: Abnormal   Collection Time    06/04/14  2:25 AM      Result Value Ref Range   Sodium 138  137 - 147 mEq/L   Potassium 3.5 (*) 3.7 - 5.3 mEq/L   Chloride 100  96 - 112 mEq/L   CO2 21  19 - 32 mEq/L   Glucose, Bld 277 (*) 70 - 99 mg/dL   BUN 42 (*) 6 - 23 mg/dL   Creatinine, Ser 1.10  0.50 - 1.35 mg/dL   Calcium 7.8 (*) 8.4 - 10.5 mg/dL   Total Protein 4.8 (*) 6.0 - 8.3 g/dL   Albumin 2.9  (*) 3.5 - 5.2 g/dL   AST 25  0 - 37 U/L   ALT 33  0 - 53 U/L   Alkaline Phosphatase 39  39 - 117 U/L   Total Bilirubin 0.3  0.3 - 1.2 mg/dL   GFR calc non Af Amer 78 (*) >90 mL/min   GFR calc Af Amer >90  >90 mL/min   Comment: (NOTE)     The eGFR has been calculated using the CKD EPI equation.     This calculation has not been validated in all clinical situations.     eGFR's persistently <90 mL/min signify possible Chronic Kidney     Disease.   Anion gap 17 (*) 5 - 15  TROPONIN I     Status: None   Collection Time    06/04/14  2:41 AM      Result Value Ref Range   Troponin I <0.30  <0.30 ng/mL   Comment:            Due to the release kinetics of cTnI,     a negative result within the first hours     of the onset of symptoms does not rule out     myocardial infarction with certainty.     If myocardial infarction is still suspected,     repeat the test at appropriate intervals.  I-STAT CHEM 8, ED     Status: Abnormal   Collection Time    06/04/14  2:47 AM      Result Value Ref Range   Sodium 137  137 - 147 mEq/L   Potassium 3.2 (*) 3.7 - 5.3 mEq/L   Chloride 100  96 - 112 mEq/L   BUN 39 (*) 6 - 23 mg/dL   Creatinine, Ser 1.30  0.50 - 1.35 mg/dL   Glucose, Bld 276 (*) 70 - 99 mg/dL   Calcium, Ion 1.11 (*) 1.12 - 1.23 mmol/L   TCO2 22  0 - 100 mmol/L   Hemoglobin 9.5 (*) 13.0 - 17.0 g/dL   HCT 28.0 (*) 39.0 - 52.0 %  PREPARE RBC (CROSSMATCH)     Status: None   Collection Time    06/04/14  4:30 AM      Result Value Ref Range   Order Confirmation ORDER PROCESSED BY BLOOD BANK    MRSA PCR SCREENING     Status: None   Collection Time    06/04/14  4:44 AM      Result Value Ref Range   MRSA by PCR NEGATIVE  NEGATIVE   Comment:            The GeneXpert MRSA Assay (FDA     approved for NASAL specimens     only), is one component of a     comprehensive MRSA colonization     surveillance program. It is not     intended to diagnose MRSA     infection nor to guide or      monitor treatment for     MRSA infections.  URINALYSIS, ROUTINE W REFLEX MICROSCOPIC     Status: Abnormal   Collection Time    06/04/14  5:46 AM      Result Value Ref Range   Color, Urine YELLOW  YELLOW   APPearance CLEAR  CLEAR   Specific Gravity, Urine 1.026  1.005 - 1.030   pH 5.0  5.0 - 8.0   Glucose, UA 250 (*) NEGATIVE mg/dL   Hgb urine dipstick NEGATIVE  NEGATIVE   Bilirubin Urine NEGATIVE  NEGATIVE   Ketones, ur NEGATIVE  NEGATIVE mg/dL   Protein, ur 30 (*) NEGATIVE mg/dL   Urobilinogen, UA 0.2  0.0 - 1.0 mg/dL   Nitrite NEGATIVE  NEGATIVE   Leukocytes, UA NEGATIVE  NEGATIVE  URINE MICROSCOPIC-ADD ON     Status: Abnormal   Collection Time    06/04/14  5:46 AM      Result Value Ref Range   Squamous Epithelial / LPF RARE  RARE   Bacteria, UA RARE  RARE   Casts GRANULAR CAST (*) NEGATIVE   Comment: HYALINE CASTS  HEMOGLOBIN AND HEMATOCRIT, BLOOD     Status: Abnormal   Collection Time    06/04/14 10:29 AM      Result Value Ref Range   Hemoglobin 10.3 (*) 13.0 - 17.0 g/dL   HCT 30.5 (*) 31.5 - 17.6 %  HELICOBACTER PYLORI ABS-IGG+IGA, BLD     Status: None   Collection Time    06/05/14  3:00 AM      Result Value Ref Range   H Pylori IgA 0.6  <9.0 U/mL   Comment: (NOTE)     Result        Interpretation     ---------     -----------------------------------------------     <9.0          Negative     9.0-11.0      Equivocal     >11.0         Positive     H. pylori IgA antibody results should be interpreted in conjunction     with H. pylori IgG for detection of antibodies to H. pylori.   H Pylori IgG <0.40     Comment: (NOTE)     No significant level of IgG antibody to H. pylori detected.       ISR = Immune Status Ratio                 <0.90         ISR       Negative  0.90 - 1.09   ISR       Equivocal                 >=1.10        ISR       Positive     The above results were obtained with the Immulite 2000 H. pylori IgG     EIA.  Results obtained from  other manufacturer's assay methods may not     be used interchangeably.     Performed at [facility]  CBC     Status: Abnormal   Collection Time    06/05/14  3:00 AM      Result Value Ref Range   WBC 5.2  4.0 - 10.5 K/uL   RBC 2.95 (*) 4.22 - 5.81 MIL/uL   Hemoglobin 9.0 (*) 13.0 - 17.0 g/dL   HCT 26.4 (*) 39.0 - 52.0 %   MCV 89.5  78.0 - 100.0 fL   MCH 30.5  26.0 - 34.0 pg   MCHC 34.1  30.0 - 36.0 g/dL   RDW 14.8  11.5 - 15.5 %   Platelets 109 (*) 150 - 400 K/uL   Comment: REPEATED TO VERIFY     PLATELET COUNT CONFIRMED BY SMEAR  BASIC METABOLIC PANEL     Status: Abnormal   Collection Time    06/05/14  3:00 AM      Result Value Ref Range   Sodium 138  137 - 147 mEq/L   Potassium 3.5 (*) 3.7 - 5.3 mEq/L   Chloride 103  96 - 112 mEq/L   CO2 24  19 - 32 mEq/L   Glucose, Bld 134 (*) 70 - 99 mg/dL   BUN 18  6 - 23 mg/dL   Comment: DELTA CHECK NOTED   Creatinine, Ser 0.78  0.50 - 1.35 mg/dL   Comment: DELTA CHECK NOTED   Calcium 8.1 (*) 8.4 - 10.5 mg/dL   GFR calc non Af Amer >90  >90 mL/min   GFR calc Af Amer >90  >90 mL/min   Comment: (NOTE)     The eGFR has been calculated using the CKD EPI equation.     This calculation has not been validated in all clinical situations.     eGFR's persistently <90 mL/min signify possible Chronic Kidney     Disease.   Anion gap 11  5 - 15  HEMOGLOBIN     Status: Abnormal   Collection Time    06/05/14  4:46 PM      Result Value Ref Range   Hemoglobin 8.9 (*) 13.0 - 17.0 g/dL  CBC     Status: Abnormal   Collection Time    06/06/14  2:33 AM      Result Value Ref Range   WBC 4.1  4.0 - 10.5 K/uL   RBC 2.67 (*) 4.22 - 5.81 MIL/uL   Hemoglobin 8.2 (*) 13.0 - 17.0 g/dL   HCT 23.9 (*) 39.0 - 52.0 %   MCV 89.5  78.0 - 100.0 fL   MCH 30.7  26.0 - 34.0 pg   MCHC 34.3  30.0 - 36.0 g/dL   RDW 14.4  11.5 - 15.5 %   Platelets 107 (*) 150 - 400 K/uL   Comment: CONSISTENT WITH PREVIOUS RESULT  COMPREHENSIVE METABOLIC PANEL     Status:  Abnormal   Collection Time    06/06/14  2:33 AM      Result Value Ref Range   Sodium 142  137 - 147 mEq/L   Potassium 3.3 (*) 3.7 - 5.3 mEq/L   Chloride 105  96 - 112 mEq/L   CO2 25  19 - 32 mEq/L   Glucose, Bld 128 (*) 70 - 99 mg/dL   BUN 11  6 - 23 mg/dL   Creatinine, Ser 0.86  0.50 - 1.35 mg/dL   Calcium 8.0 (*) 8.4 - 10.5 mg/dL   Total Protein 5.4 (*) 6.0 - 8.3 g/dL   Albumin 2.7 (*) 3.5 - 5.2 g/dL   AST 9  0 - 37 U/L   ALT 15  0 - 53 U/L   Alkaline Phosphatase 40  39 - 117 U/L   Total Bilirubin 0.4  0.3 - 1.2 mg/dL   GFR calc non Af Amer >90  >90 mL/min   GFR calc Af Amer >90  >90 mL/min   Comment: (NOTE)     The eGFR has been calculated using the CKD EPI equation.     This calculation has not been validated in all clinical situations.     eGFR's persistently <90 mL/min signify possible Chronic Kidney     Disease.   Anion gap 12  5 - 15  CBC WITH DIFFERENTIAL     Status: Abnormal   Collection Time    06/07/14  7:19 AM      Result Value Ref Range   WBC 5.4  4.0 - 10.5 K/uL   RBC 3.00 (*) 4.22 - 5.81 MIL/uL   Hemoglobin 8.9 (*) 13.0 - 17.0 g/dL   Comment: REPEATED TO VERIFY   HCT 26.6 (*) 39.0 - 52.0 %   MCV 88.7  78.0 - 100.0 fL   MCH 29.7  26.0 - 34.0 pg   MCHC 33.5  30.0 - 36.0 g/dL   RDW 13.9  11.5 - 15.5 %   Platelets 149 (*) 150 - 400 K/uL   Comment: REPEATED TO VERIFY   Neutrophils Relative % 83 (*) 43 - 77 %   Neutro Abs 4.5  1.7 - 7.7 K/uL   Lymphocytes Relative 9 (*) 12 - 46 %   Lymphs Abs 0.5 (*) 0.7 - 4.0 K/uL   Monocytes Relative 6  3 - 12 %   Monocytes Absolute 0.3  0.1 - 1.0 K/uL   Eosinophils Relative 2  0 - 5 %   Eosinophils Absolute 0.1  0.0 - 0.7 K/uL   Basophils Relative 0  0 - 1 %   Basophils Absolute 0.0  0.0 - 0.1 K/uL  BASIC METABOLIC PANEL     Status: Abnormal   Collection Time    06/07/14  7:19 AM      Result Value Ref Range   Sodium 138  137 - 147 mEq/L   Potassium 3.2 (*) 3.7 - 5.3 mEq/L   Chloride 100  96 - 112 mEq/L   CO2 24   19 - 32 mEq/L   Glucose, Bld 144 (*) 70 - 99 mg/dL   BUN 9  6 - 23 mg/dL   Creatinine, Ser 0.75  0.50 - 1.35 mg/dL   Calcium 8.6  8.4 - 10.5 mg/dL   GFR calc non Af Amer >90  >90 mL/min   GFR calc Af Amer >90  >90 mL/min   Comment: (NOTE)     The eGFR has been calculated using the CKD EPI equation.     This calculation has not been validated in all clinical situations.     eGFR's persistently <90 mL/min signify possible Chronic Kidney  Disease.   Anion gap 14  5 - 15  TYPE AND SCREEN     Status: None   Collection Time    06/07/14  7:19 AM      Result Value Ref Range   ABO/RH(D) O POS     Antibody Screen NEG     Sample Expiration 06/10/2014    I-STAT CG4 LACTIC ACID, ED     Status: None   Collection Time    06/07/14  7:33 AM      Result Value Ref Range   Lactic Acid, Venous 1.69  0.5 - 2.2 mmol/L  FERRITIN     Status: Abnormal   Collection Time    06/20/14 10:03 AM      Result Value Ref Range   Ferritin 27 (*) 30 - 400 ng/mL  IRON AND TIBC     Status: Abnormal   Collection Time    06/20/14 10:03 AM      Result Value Ref Range   TIBC 444  250 - 450 ug/dL   UIBC 424 (*) 150 - 375 ug/dL   Iron 20 (*) 40 - 155 ug/dL   Iron Saturation 5 (*) 15 - 55 %  SEDIMENTATION RATE     Status: None   Collection Time    06/20/14 10:03 AM      Result Value Ref Range   Sed Rate 8  0 - 15 mm/hr  CBC & DIFF AND RETIC     Status: Abnormal   Collection Time    07/14/14  3:16 PM      Result Value Ref Range   WBC 2.5 (*) 4.0 - 10.3 10e3/uL   NEUT# 1.3 (*) 1.5 - 6.5 10e3/uL   HGB 11.8 (*) 13.0 - 17.1 g/dL   HCT 36.4 (*) 38.4 - 49.9 %   Platelets 156  140 - 400 10e3/uL   MCV 86.1  79.3 - 98.0 fL   MCH 27.9  27.2 - 33.4 pg   MCHC 32.4  32.0 - 36.0 g/dL   RBC 4.23  4.20 - 5.82 10e6/uL   RDW 16.0 (*) 11.0 - 14.6 %   lymph# 1.1  0.9 - 3.3 10e3/uL   MONO# 0.2  0.1 - 0.9 10e3/uL   Eosinophils Absolute 0.0  0.0 - 0.5 10e3/uL   Basophils Absolute 0.0  0.0 - 0.1 10e3/uL   NEUT% 49.5  39.0 -  75.0 %   LYMPH% 42.9  14.0 - 49.0 %   MONO% 6.0  0.0 - 14.0 %   EOS% 0.8  0.0 - 7.0 %   BASO% 0.8  0.0 - 2.0 %   nRBC 0  0 - 0 %   Retic % 0.98  0.80 - 1.80 %   Retic Ct Abs 41.45  34.80 - 93.90 10e3/uL   Immature Retic Fract 5.40  3.00 - 10.60 %  [facility] SMEAR     Status: None   Collection Time    07/14/14  3:16 PM      Result Value Ref Range   Smear Result Smear Available    MORPHOLOGY     Status: None   Collection Time    07/14/14  3:16 PM      Result Value Ref Range   Ovalocytes Few  Negative   White Cell Comments few Variant Lymphs     PLT EST Adequate  Adequate   ASSESSMENT & PLAN Leukopenia The cause is unknown. The patient denies recent history of fevers, cough, chills, diarrhea or  dysuria. He is asymptomatic from the leukopenia. I will observe for now.  I will order additional workup for this.  Unspecified deficiency anemia This is likely anemia of from chronic blood loss.  The patient denies recent history of bleeding such as epistaxis, hematuria or hematochezia. He is asymptomatic from the anemia. We will observe for now.  He does not require transfusion now. He had received recent iron infusion. I recommend the patient to continue of avoidance of NSAIDS I will recheck iron studies.   Multiple gastric ulcers This was related to excessive ingestion of aspirin-like products as well as alcohol. I recommend he continues to state abstinent from alcoholism and to take high-dose proton pump inhibitor for at least the next 3 months.  Dizziness The cause is unknown but could be related to his antihypertensives. I recommend he takes this medication at nighttime to see if this could alleviate some of the dizziness.  Chronic headaches I suspect that could be related to chronic poor sleep from undiagnosed of sleep apnea. Sleep study is pending.   We discussed the importance of preventive care and reviewed the vaccination programs. He does not have any prior allergic reactions  to influenza vaccination. He agrees to proceed with influenza vaccination today and we will administer it today at the clinic.

## 2014-07-14 NOTE — Assessment & Plan Note (Signed)
The cause is unknown. The patient denies recent history of fevers, cough, chills, diarrhea or dysuria. He is asymptomatic from the leukopenia. I will observe for now.  I will order additional workup for this.

## 2014-07-14 NOTE — Assessment & Plan Note (Signed)
This was related to excessive ingestion of aspirin-like products as well as alcohol. I recommend he continues to state abstinent from alcoholism and to take high-dose proton pump inhibitor for at least the next 3 months.

## 2014-07-15 LAB — VITAMIN B12: Vitamin B-12: 482 pg/mL (ref 211–911)

## 2014-07-15 LAB — FERRITIN CHCC: Ferritin: 24 ng/ml (ref 22–316)

## 2014-07-15 LAB — ANA: Anti Nuclear Antibody(ANA): NEGATIVE

## 2014-07-15 LAB — SEDIMENTATION RATE: Sed Rate: 1 mm/hr (ref 0–16)

## 2014-07-21 ENCOUNTER — Telehealth: Payer: Self-pay | Admitting: *Deleted

## 2014-07-21 NOTE — Telephone Encounter (Signed)
Pt asking if his lab results are ok?  He said Dr. Alvy Bimler told him he would not need to return if his labs were good.  Informed pt his WBC is a little low but not sure if this means he has to come back or not.  Will ask Dr. Alvy Bimler and call him back on Monday.  He is scheduled to see her again on 10/02.

## 2014-07-22 NOTE — Telephone Encounter (Signed)
Blood count was better but not normal. I recommend he keeps his appt

## 2014-07-25 ENCOUNTER — Telehealth: Payer: Self-pay | Admitting: *Deleted

## 2014-07-25 NOTE — Telephone Encounter (Signed)
Spoke with patient. Dr Alvy Bimler says labs are better,but not normal- recommends keeping appt.

## 2014-07-26 ENCOUNTER — Ambulatory Visit (AMBULATORY_SURGERY_CENTER): Payer: Self-pay | Admitting: *Deleted

## 2014-07-26 VITALS — Ht 66.0 in | Wt 201.0 lb

## 2014-07-26 DIAGNOSIS — K259 Gastric ulcer, unspecified as acute or chronic, without hemorrhage or perforation: Secondary | ICD-10-CM

## 2014-07-26 NOTE — Progress Notes (Signed)
No allergies to eggs or soy. No problems with anesthesia.  Pt not given Emmi instructions for EGD; no computer access  No oxygen use  No diet drug use  

## 2014-07-28 ENCOUNTER — Other Ambulatory Visit: Payer: Self-pay | Admitting: Hematology and Oncology

## 2014-07-28 DIAGNOSIS — D539 Nutritional anemia, unspecified: Secondary | ICD-10-CM

## 2014-07-29 ENCOUNTER — Ambulatory Visit (HOSPITAL_BASED_OUTPATIENT_CLINIC_OR_DEPARTMENT_OTHER): Payer: Medicare Other | Admitting: Hematology and Oncology

## 2014-07-29 ENCOUNTER — Encounter: Payer: Self-pay | Admitting: Hematology and Oncology

## 2014-07-29 ENCOUNTER — Telehealth: Payer: Self-pay | Admitting: Hematology and Oncology

## 2014-07-29 ENCOUNTER — Other Ambulatory Visit (HOSPITAL_BASED_OUTPATIENT_CLINIC_OR_DEPARTMENT_OTHER): Payer: Medicare Other

## 2014-07-29 VITALS — BP 132/86 | HR 63 | Temp 99.0°F | Resp 18 | Ht 66.0 in | Wt 201.1 lb

## 2014-07-29 DIAGNOSIS — D72819 Decreased white blood cell count, unspecified: Secondary | ICD-10-CM

## 2014-07-29 DIAGNOSIS — D539 Nutritional anemia, unspecified: Secondary | ICD-10-CM

## 2014-07-29 DIAGNOSIS — D62 Acute posthemorrhagic anemia: Secondary | ICD-10-CM

## 2014-07-29 DIAGNOSIS — D509 Iron deficiency anemia, unspecified: Secondary | ICD-10-CM

## 2014-07-29 DIAGNOSIS — D696 Thrombocytopenia, unspecified: Secondary | ICD-10-CM | POA: Insufficient documentation

## 2014-07-29 DIAGNOSIS — K259 Gastric ulcer, unspecified as acute or chronic, without hemorrhage or perforation: Secondary | ICD-10-CM

## 2014-07-29 LAB — CBC & DIFF AND RETIC
BASO%: 1.1 % (ref 0.0–2.0)
Basophils Absolute: 0 10*3/uL (ref 0.0–0.1)
EOS%: 1.1 % (ref 0.0–7.0)
Eosinophils Absolute: 0 10*3/uL (ref 0.0–0.5)
HCT: 37.7 % — ABNORMAL LOW (ref 38.4–49.9)
HGB: 12.4 g/dL — ABNORMAL LOW (ref 13.0–17.1)
Immature Retic Fract: 6.2 % (ref 3.00–10.60)
LYMPH%: 43.7 % (ref 14.0–49.0)
MCH: 27.4 pg (ref 27.2–33.4)
MCHC: 32.9 g/dL (ref 32.0–36.0)
MCV: 83.4 fL (ref 79.3–98.0)
MONO#: 0.2 10*3/uL (ref 0.1–0.9)
MONO%: 6.5 % (ref 0.0–14.0)
NEUT#: 1.3 10*3/uL — ABNORMAL LOW (ref 1.5–6.5)
NEUT%: 47.6 % (ref 39.0–75.0)
Platelets: 132 10*3/uL — ABNORMAL LOW (ref 140–400)
RBC: 4.52 10*6/uL (ref 4.20–5.82)
RDW: 14.5 % (ref 11.0–14.6)
Retic %: 0.85 % (ref 0.80–1.80)
Retic Ct Abs: 38.42 10*3/uL (ref 34.80–93.90)
WBC: 2.8 10*3/uL — ABNORMAL LOW (ref 4.0–10.3)
lymph#: 1.2 10*3/uL (ref 0.9–3.3)

## 2014-07-29 LAB — IRON AND TIBC CHCC
%SAT: 11 % — ABNORMAL LOW (ref 20–55)
Iron: 44 ug/dL (ref 42–163)
TIBC: 400 ug/dL (ref 202–409)
UIBC: 356 ug/dL (ref 117–376)

## 2014-07-29 LAB — FERRITIN CHCC: Ferritin: 12 ng/ml — ABNORMAL LOW (ref 22–316)

## 2014-07-29 LAB — MORPHOLOGY: PLT EST: DECREASED

## 2014-07-29 NOTE — Assessment & Plan Note (Signed)
The cause is unknown. The patient denies recent history of fevers, cough, chills, diarrhea or dysuria. He is asymptomatic from the leukopenia. I will observe for now.   

## 2014-07-29 NOTE — Assessment & Plan Note (Signed)
The patient has minimum symptoms. I suspect this could be due to either due to consumption of mild liver disease. I recommend observation only.

## 2014-07-29 NOTE — Assessment & Plan Note (Signed)
This is likely anemia of from chronic blood loss.  The patient denies recent history of bleeding such as epistaxis, hematuria or hematochezia. He is asymptomatic from the anemia. We will observe for now.  He does not require transfusion now. He had received recent iron infusion. I recommend the patient to continue of avoidance of NSAIDS I will recheck iron studies in 3 months. In the meantime, I recommend that he resume taking iron supplement twice a day as tolerated.

## 2014-07-29 NOTE — Progress Notes (Signed)
[place] OFFICE PROGRESS NOTE  No PCP Per Patient SUMMARY OF HEMATOLOGIC HISTORY:  He was found to have abnormal CBC from his primary care office after he was recently discharged from the hospital for GI bleed. Total white blood cell count on 06/23/2014 was 2.0. The patient had excessive ingestion of aspirin like products for several years because of daily headaches. He has occasional blurriness of vision and neck pain. The patient also has significant alcoholism for many years. He would drink somewhere around 30-40 drinks per week. He may also have a diagnosis of obstructive sleep apnea. He was having hemoptysis and was hospitalized. He received 3 units of blood transfusion. Endoscopy revealed gastric ulcers. He received iron infusion and was subsequently discharged. He stated he has been abstinent from alcoholism for long time. He complains of postprandial abdominal pain.  INTERVAL HISTORY: Joseph Fisher 48 y.o. male returns for further followup. He denies recent infection. The patient denies any recent signs or symptoms of bleeding such as spontaneous epistaxis, hematuria or hematochezia.   I have reviewed the past medical history, past surgical history, social history and family history with the patient and they are unchanged from previous note.  ALLERGIES:  has No Known Allergies.  MEDICATIONS:  Current Outpatient Prescriptions  Medication Sig Dispense Refill  . acetaminophen (TYLENOL) 500 MG tablet Take 500 mg by mouth every 6 (six) hours as needed (pain).      Marland Kitchen ALPRAZolam (XANAX) 0.5 MG tablet Take 0.5 mg by mouth at bedtime as needed for anxiety or sleep.      Marland Kitchen omeprazole (PRILOSEC) 40 MG capsule Take 1 capsule (40 mg total) by mouth 2 (two) times daily before a meal.  60 capsule  0   No current facility-administered medications for this visit.     REVIEW OF SYSTEMS:   Constitutional: Denies fevers, chills or night sweats Eyes: Denies blurriness of  vision Ears, nose, mouth, throat, and face: Denies mucositis or sore throat Respiratory: Denies cough, dyspnea or wheezes Cardiovascular: Denies palpitation, chest discomfort or lower extremity swelling Gastrointestinal:  Denies nausea, heartburn or change in bowel habits Skin: Denies abnormal skin rashes Lymphatics: Denies new lymphadenopathy or easy bruising Neurological:Denies numbness, tingling or new weaknesses Behavioral/Psych: Mood is stable, no new changes  All other systems were reviewed with the patient and are negative.  PHYSICAL EXAMINATION: ECOG PERFORMANCE STATUS: 0 - Asymptomatic  Filed Vitals:   07/29/14 1421  BP: 132/86  Pulse: 63  Temp: 99 F (37.2 C)  Resp: 18   Filed Weights   07/29/14 1421  Weight: 201 lb 1.6 oz (91.218 kg)    GENERAL:alert, no distress and comfortable SKIN: skin color, texture, turgor are normal, no rashes or significant lesions EYES: normal, Conjunctiva are pink and non-injected, sclera clear NEURO: alert & oriented x 3 with fluent speech, no focal motor/sensory deficits  LABORATORY DATA:  I have reviewed the data as listed Results for orders placed in visit on 07/29/14 (from the past 48 hour(s))  CBC & DIFF AND RETIC     Status: Abnormal   Collection Time    07/29/14  2:00 PM      Result Value Ref Range   WBC 2.8 (*) 4.0 - 10.3 10e3/uL   NEUT# 1.3 (*) 1.5 - 6.5 10e3/uL   HGB 12.4 (*) 13.0 - 17.1 g/dL   HCT 37.7 (*) 38.4 - 49.9 %   Platelets 132 (*) 140 - 400 10e3/uL   MCV 83.4  79.3 - 98.0  fL   MCH 27.4  27.2 - 33.4 pg   MCHC 32.9  32.0 - 36.0 g/dL   RBC 4.52  4.20 - 5.82 10e6/uL   RDW 14.5  11.0 - 14.6 %   lymph# 1.2  0.9 - 3.3 10e3/uL   MONO# 0.2  0.1 - 0.9 10e3/uL   Eosinophils Absolute 0.0  0.0 - 0.5 10e3/uL   Basophils Absolute 0.0  0.0 - 0.1 10e3/uL   NEUT% 47.6  39.0 - 75.0 %   LYMPH% 43.7  14.0 - 49.0 %   MONO% 6.5  0.0 - 14.0 %   EOS% 1.1  0.0 - 7.0 %   BASO% 1.1  0.0 - 2.0 %   Retic % 0.85  0.80 - 1.80 %    Retic Ct Abs 38.42  34.80 - 93.90 10e3/uL   Immature Retic Fract 6.20  3.00 - 10.60 %  MORPHOLOGY     Status: None   Collection Time    07/29/14  2:00 PM      Result Value Ref Range   Polychromasia Slight  Slight   Ovalocytes Few  Negative   White Cell Comments C/W auto diff     PLT EST Decreased  Adequate    Lab Results  Component Value Date   WBC 2.8* 07/29/2014   HGB 12.4* 07/29/2014   HCT 37.7* 07/29/2014   MCV 83.4 07/29/2014   PLT 132* 07/29/2014   ASSESSMENT & PLAN:  Leukopenia The cause is unknown. The patient denies recent history of fevers, cough, chills, diarrhea or dysuria. He is asymptomatic from the leukopenia. I will observe for now.    Deficiency anemia This is likely anemia of from chronic blood loss.  The patient denies recent history of bleeding such as epistaxis, hematuria or hematochezia. He is asymptomatic from the anemia. We will observe for now.  He does not require transfusion now. He had received recent iron infusion. I recommend the patient to continue of avoidance of NSAIDS I will recheck iron studies in 3 months. In the meantime, I recommend that he resume taking iron supplement twice a day as tolerated.     Multiple gastric ulcers I am concerned about possible recurrence of a stomach ulcer. The patient is compliant taking proton pump inhibitor. I recommend he continues close followup with GI.  Thrombocytopenia The patient has minimum symptoms. I suspect this could be due to either due to consumption of mild liver disease. I recommend observation only.     All questions were answered. The patient knows to call the clinic with any problems, questions or concerns. No barriers to learning was detected.  I spent 15 minutes counseling the patient face to face. The total time spent in the appointment was 20 minutes and more than 50% was on counseling.     [facility], [place], MD 07/29/2014 3:18 PM

## 2014-07-29 NOTE — Telephone Encounter (Signed)
gv and print ed appt sched and avs for pt fro Jan ...added tx.

## 2014-07-29 NOTE — Assessment & Plan Note (Signed)
I am concerned about possible recurrence of a stomach ulcer. The patient is compliant taking proton pump inhibitor. I recommend he continues close followup with GI.

## 2014-07-30 LAB — SEDIMENTATION RATE: Sed Rate: 4 mm/hr (ref 0–16)

## 2014-08-08 ENCOUNTER — Ambulatory Visit (AMBULATORY_SURGERY_CENTER): Payer: Medicare Other | Admitting: Internal Medicine

## 2014-08-08 ENCOUNTER — Encounter: Payer: Self-pay | Admitting: Internal Medicine

## 2014-08-08 VITALS — BP 121/75 | HR 54 | Temp 97.3°F | Resp 17 | Ht 66.0 in | Wt 201.0 lb

## 2014-08-08 DIAGNOSIS — K259 Gastric ulcer, unspecified as acute or chronic, without hemorrhage or perforation: Secondary | ICD-10-CM

## 2014-08-08 DIAGNOSIS — K228 Other specified diseases of esophagus: Secondary | ICD-10-CM

## 2014-08-08 DIAGNOSIS — K2270 Barrett's esophagus without dysplasia: Secondary | ICD-10-CM

## 2014-08-08 MED ORDER — SODIUM CHLORIDE 0.9 % IV SOLN: 500.0000 mL | INTRAVENOUS | Status: DC

## 2014-08-08 MED ORDER — OMEPRAZOLE 40 MG PO CPDR: 40.0000 mg | DELAYED_RELEASE_CAPSULE | Freq: Every day | ORAL | Status: DC

## 2014-08-08 NOTE — Patient Instructions (Signed)
Discharge instructions given with verbal understanding. Biopsies taken. Resume previous medications. YOU HAD AN ENDOSCOPIC PROCEDURE TODAY AT THE [facility]: Refer to the procedure report that was given to you for any specific questions about what was found during the examination.  If the procedure report does not answer your questions, please call your gastroenterologist to clarify.  If you requested that your care partner not be given the details of your procedure findings, then the procedure report has been included in a sealed envelope for you to review at your convenience later.  YOU SHOULD EXPECT: Some feelings of bloating in the abdomen. Passage of more gas than usual.  Walking can help get rid of the air that was put into your GI tract during the procedure and reduce the bloating. If you had a lower endoscopy (such as a colonoscopy or flexible sigmoidoscopy) you may notice spotting of blood in your stool or on the toilet paper. If you underwent a bowel prep for your procedure, then you may not have a normal bowel movement for a few days.  DIET: Your first meal following the procedure should be a light meal and then it is ok to progress to your normal diet.  A half-sandwich or bowl of soup is an example of a good first meal.  Heavy or fried foods are harder to digest and may make you feel nauseous or bloated.  Likewise meals heavy in dairy and vegetables can cause extra gas to form and this can also increase the bloating.  Drink plenty of fluids but you should avoid alcoholic beverages for 24 hours.  ACTIVITY: Your care partner should take you home directly after the procedure.  You should plan to take it easy, moving slowly for the rest of the day.  You can resume normal activity the day after the procedure however you should NOT DRIVE or use heavy machinery for 24 hours (because of the sedation medicines used during the test).    SYMPTOMS TO REPORT IMMEDIATELY: A gastroenterologist  can be reached at any hour.  During normal business hours, 8:30 AM to 5:00 PM Monday through Friday, call [phone].  After hours and on weekends, please call the GI answering service at [phone] who will take a message and have the physician on call contact you.   Following upper endoscopy (EGD)  Vomiting of blood or coffee ground material  New chest pain or pain under the shoulder blades  Painful or persistently difficult swallowing  New shortness of breath  Fever of 100F or higher  Black, tarry-looking stools  FOLLOW UP: If any biopsies were taken you will be contacted by phone or by letter within the next 1-3 weeks.  Call your gastroenterologist if you have not heard about the biopsies in 3 weeks.  Our staff will call the home number listed on your records the next business day following your procedure to check on you and address any questions or concerns that you may have at that time regarding the information given to you following your procedure. This is a courtesy call and so if there is no answer at the home number and we have not heard from you through the emergency physician on call, we will assume that you have returned to your regular daily activities without incident.  SIGNATURES/CONFIDENTIALITY: You and/or your care partner have signed paperwork which will be entered into your electronic medical record.  These signatures attest to the fact that that the information above on your After   Visit Summary has been reviewed and is understood.  Full responsibility of the confidentiality of this discharge information lies with you and/or your care-partner. 

## 2014-08-08 NOTE — Progress Notes (Signed)
Patient denies any allergies to eggs or soy. Patient denies any problems with anesthesia/sedation.

## 2014-08-08 NOTE — Op Note (Signed)
[facility]  [address] [place], [address]   ENDOSCOPY PROCEDURE REPORT  PATIENT: Joseph, Fisher  MR#: [id number] BIRTHDATE: 11/22/1965 , 48  yrs. old GENDER: male ENDOSCOPIST: Jerene Bears, MD PROCEDURE DATE:  08/08/2014 PROCEDURE:  EGD w/ biopsy ASA CLASS:     Class III INDICATIONS:  surveillance of gastric ulcers seen at endoscopy during hospitalization August 2015, esophagitis and probable Barrett's esophagus, biopsies not performed at that time due to acute bleeding. MEDICATIONS: Monitored anesthesia care and Propofol 240 mg IV TOPICAL ANESTHETIC: none  DESCRIPTION OF PROCEDURE: After the risks benefits and alternatives of the procedure were thoroughly explained, informed consent was obtained.  The LB [id number] endoscope was introduced through the mouth and advanced to the second portion of the duodenum , Without limitations.  The instrument was slowly withdrawn as the mucosa was fully examined.   ESOPHAGUS: There was a 6cm segment of suspected Barrett's esophagus found 33 cm from the incisors (Extending to the top of the gastric folds at 39 cm).  The length of circumferential Barrett's was 6cm ([place] C6, M6).  There was no nodular mucosa noted in the Barrett's segment.  Cold forceps were used.  Four quadrant biopsies for were taken every 2cm within the Barrett's segment.  STOMACH: Gastritis (inflammation) was found in the gastric antrum. No residual ulcer seen and overall inflammation is considerably improved from previous endoscopy. Multiple biopsies were performed using cold forceps.  Sample sent for histology and to exclude H. pylori.  DUODENUM: The duodenal mucosa showed no abnormalities in the bulb and 2nd part of the duodenum. Retroflexed views revealed no abnormalities.     The scope was then withdrawn from the patient and the procedure completed.  COMPLICATIONS: There were no immediate complications.  ENDOSCOPIC  IMPRESSION: 1.   There was a 6cm segment of suspected Barrett's esophagus found 33 cm from the incisors; multiple biopsies 2.   Gastritis (inflammation) was found in the gastric antrum; multiple biopsies were performed.  No residual ulcers and overall inflammation improved 3.   The duodenal mucosa showed no abnormalities in the bulb and 2nd part of the duodenum  RECOMMENDATIONS: 1.  Await biopsy results 2.  Avoid NSAIDs 3.  Follow-up of helicobacter pylori status, treat if indicated 4.  Can decrease omeprazole to 40 mg once daily.  It is best to be taken 20-30 minutes prior to breakfast meal.  eSigned:  Jerene Bears, MD 08/08/2014 10:44 AM    CC:The Patient; PCP  PATIENT NAME:  Joseph, Fisher MR#: [id number]

## 2014-08-08 NOTE — Progress Notes (Signed)
Called to room to assist during endoscopic procedure.  Patient ID and intended procedure confirmed with present staff. Received instructions for my participation in the procedure from the performing physician.  

## 2014-08-08 NOTE — Progress Notes (Signed)
Procedure ends, to recovery, report given and VSS. 

## 2014-08-09 ENCOUNTER — Telehealth: Payer: Self-pay

## 2014-08-09 NOTE — Telephone Encounter (Signed)
  Follow up Call-  Call back number 08/08/2014  Post procedure Call Back phone  # [phone]  Permission to leave phone message Yes     Patient questions:  Do you have a fever, pain , or abdominal swelling? No. Pain Score  0 *  Have you tolerated food without any problems? Yes.    Have you been able to return to your normal activities? Yes.    Do you have any questions about your discharge instructions: Diet   No. Medications  No. Follow up visit  No.  Do you have questions or concerns about your Care? No.  Actions: * If pain score is 4 or above: No action needed, pain <4.

## 2014-08-11 ENCOUNTER — Encounter: Payer: Self-pay | Admitting: Internal Medicine

## 2014-10-09 ENCOUNTER — Emergency Department (HOSPITAL_COMMUNITY)
Admission: EM | Admit: 2014-10-09 | Discharge: 2014-10-09 | Disposition: A | Discharge status: Home / Self Care | Payer: Medicare Other | Attending: Emergency Medicine | Admitting: Emergency Medicine

## 2014-10-09 ENCOUNTER — Emergency Department (HOSPITAL_COMMUNITY): Payer: Medicare Other

## 2014-10-09 ENCOUNTER — Encounter (HOSPITAL_COMMUNITY): Payer: Self-pay | Admitting: *Deleted

## 2014-10-09 DIAGNOSIS — Z862 Personal history of diseases of the blood and blood-forming organs and certain disorders involving the immune mechanism: Secondary | ICD-10-CM | POA: Insufficient documentation

## 2014-10-09 DIAGNOSIS — Z8711 Personal history of peptic ulcer disease: Secondary | ICD-10-CM | POA: Diagnosis not present

## 2014-10-09 DIAGNOSIS — I10 Essential (primary) hypertension: Secondary | ICD-10-CM | POA: Diagnosis not present

## 2014-10-09 DIAGNOSIS — K2270 Barrett's esophagus without dysplasia: Secondary | ICD-10-CM | POA: Diagnosis not present

## 2014-10-09 DIAGNOSIS — R1013 Epigastric pain: Secondary | ICD-10-CM | POA: Diagnosis not present

## 2014-10-09 DIAGNOSIS — R109 Unspecified abdominal pain: Secondary | ICD-10-CM | POA: Diagnosis present

## 2014-10-09 DIAGNOSIS — Z8669 Personal history of other diseases of the nervous system and sense organs: Secondary | ICD-10-CM | POA: Diagnosis not present

## 2014-10-09 DIAGNOSIS — R1084 Generalized abdominal pain: Secondary | ICD-10-CM

## 2014-10-09 DIAGNOSIS — R1032 Left lower quadrant pain: Secondary | ICD-10-CM | POA: Diagnosis not present

## 2014-10-09 DIAGNOSIS — G8929 Other chronic pain: Secondary | ICD-10-CM | POA: Diagnosis not present

## 2014-10-09 DIAGNOSIS — Z8709 Personal history of other diseases of the respiratory system: Secondary | ICD-10-CM | POA: Diagnosis not present

## 2014-10-09 DIAGNOSIS — Z79899 Other long term (current) drug therapy: Secondary | ICD-10-CM | POA: Diagnosis not present

## 2014-10-09 HISTORY — DX: Gastrointestinal hemorrhage, unspecified: K92.2

## 2014-10-09 HISTORY — DX: Pneumonia, unspecified organism: J18.9

## 2014-10-09 HISTORY — DX: Barrett's esophagus without dysplasia: K22.70

## 2014-10-09 HISTORY — DX: Other shock: R57.8

## 2014-10-09 HISTORY — DX: Gastric ulcer, unspecified as acute or chronic, without hemorrhage or perforation: K25.9

## 2014-10-09 LAB — COMPREHENSIVE METABOLIC PANEL
ALT: 19 U/L (ref 0–53)
AST: 18 U/L (ref 0–37)
Albumin: 5 g/dL (ref 3.5–5.2)
Alkaline Phosphatase: 47 U/L (ref 39–117)
Anion gap: 18 — ABNORMAL HIGH (ref 5–15)
BUN: 11 mg/dL (ref 6–23)
CO2: 23 mEq/L (ref 19–32)
Calcium: 10.2 mg/dL (ref 8.4–10.5)
Chloride: 96 mEq/L (ref 96–112)
Creatinine, Ser: 0.91 mg/dL (ref 0.50–1.35)
GFR calc Af Amer: >90 mL/min (ref >90)
GFR calc non Af Amer: >90 mL/min (ref >90)
Glucose, Bld: 108 mg/dL — ABNORMAL HIGH (ref 70–99)
Potassium: 4.3 mEq/L (ref 3.7–5.3)
Sodium: 137 mEq/L (ref 137–147)
Total Bilirubin: 0.8 mg/dL (ref 0.3–1.2)
Total Protein: 8.4 g/dL — ABNORMAL HIGH (ref 6.0–8.3)

## 2014-10-09 LAB — CBC WITH DIFFERENTIAL/PLATELET
Basophils Absolute: 0 10*3/uL (ref 0.0–0.1)
Basophils Relative: 0 % (ref 0–1)
Eosinophils Absolute: 0 10*3/uL (ref 0.0–0.7)
Eosinophils Relative: 1 % (ref 0–5)
HCT: 46.9 % (ref 39.0–52.0)
Hemoglobin: 16.3 g/dL (ref 13.0–17.0)
Lymphocytes Relative: 27 % (ref 12–46)
Lymphs Abs: 1.4 10*3/uL (ref 0.7–4.0)
MCH: 29.3 pg (ref 26.0–34.0)
MCHC: 34.8 g/dL (ref 30.0–36.0)
MCV: 84.4 fL (ref 78.0–100.0)
Monocytes Absolute: 0.4 10*3/uL (ref 0.1–1.0)
Monocytes Relative: 8 % (ref 3–12)
Neutro Abs: 3.5 10*3/uL (ref 1.7–7.7)
Neutrophils Relative %: 64 % (ref 43–77)
Platelets: 153 10*3/uL (ref 150–400)
RBC: 5.56 MIL/uL (ref 4.22–5.81)
RDW: 15.2 % (ref 11.5–15.5)
WBC: 5.3 10*3/uL (ref 4.0–10.5)

## 2014-10-09 LAB — URINALYSIS, ROUTINE W REFLEX MICROSCOPIC
Bilirubin Urine: NEGATIVE
Glucose, UA: NEGATIVE mg/dL
Hgb urine dipstick: NEGATIVE
Ketones, ur: NEGATIVE mg/dL
Leukocytes, UA: NEGATIVE
Nitrite: NEGATIVE
Protein, ur: NEGATIVE mg/dL
Specific Gravity, Urine: 1.002 — ABNORMAL LOW (ref 1.005–1.030)
Urobilinogen, UA: 0.2 mg/dL (ref 0.0–1.0)
pH: 6 (ref 5.0–8.0)

## 2014-10-09 LAB — LIPASE, BLOOD: Lipase: 42 U/L (ref 11–59)

## 2014-10-09 MED ORDER — SODIUM CHLORIDE 0.9 % IV SOLN
1000.0000 mL | INTRAVENOUS | Status: DC
  Administered 2014-10-09: 1000 mL via INTRAVENOUS

## 2014-10-09 MED ORDER — HYDROCODONE-ACETAMINOPHEN 5-325 MG PO TABS: 1.0000 | ORAL_TABLET | ORAL | Status: DC | PRN

## 2014-10-09 MED ORDER — IOHEXOL 300 MG/ML  SOLN
100.0000 mL | Freq: Once | INTRAMUSCULAR | Status: AC | PRN
  Administered 2014-10-09: 100 mL via INTRAVENOUS

## 2014-10-09 MED ORDER — ONDANSETRON HCL 4 MG/2ML IJ SOLN
4.0000 mg | Freq: Once | INTRAMUSCULAR | Status: AC
  Administered 2014-10-09: 4 mg via INTRAVENOUS
  Filled 2014-10-09: qty 2

## 2014-10-09 MED ORDER — HYDROMORPHONE HCL 1 MG/ML IJ SOLN
1.0000 mg | INTRAMUSCULAR | Status: DC | PRN
  Administered 2014-10-09 (×2): 1 mg via INTRAVENOUS
  Filled 2014-10-09 (×2): qty 1

## 2014-10-09 MED ORDER — IOHEXOL 300 MG/ML  SOLN
50.0000 mL | Freq: Once | INTRAMUSCULAR | Status: AC | PRN
  Administered 2014-10-09: 50 mL via ORAL

## 2014-10-09 MED ORDER — SODIUM CHLORIDE 0.9 % IV SOLN
1000.0000 mL | Freq: Once | INTRAVENOUS | Status: AC
  Administered 2014-10-09: 1000 mL via INTRAVENOUS

## 2014-10-09 NOTE — Discharge Instructions (Signed)

## 2014-10-09 NOTE — ED Provider Notes (Addendum)
CSN: [id number]     Arrival date & time 10/09/14  1320 History   First MD Initiated Contact with Patient 10/09/14 1327     Chief Complaint  Patient presents with  . Abdominal Pain     (Consider location/radiation/quality/duration/timing/severity/associated sxs/prior Treatment) HPI Patient presents to the emergency room with complaints of left sided and midline abdominal pain for the last 2 days. The patient has a history of having gastric ulcer with hemorrhage in September of this year. He had endoscopy and treatment. The patient has not had any issues since then until the last couple of days. Patient is on some nausea but no vomiting or diarrhea. He's had normal bowel movements. He denies any fever. Food does not make his symptoms worse however he has not had much of an appetite. He denies any hematemesis. He denies any blood in his stools. Patient is concerned because he had similar symptoms when he first was diagnosed with his ulcer problems. Past Medical History  Diagnosis Date  . Hypertension   . Peptic ulcer   . Chronic back pain   . Insomnia   . Snoring 06/20/2014  . Hypoxemia 06/20/2014  . RLS (restless legs syndrome) 06/20/2014  . Anemia, unspecified 07/14/2014  . Unspecified deficiency anemia 07/14/2014  . Blood transfusion without reported diagnosis 05/2014  . Gastric ulcer   . Barrett's esophagus   . GI bleed   . Hemorrhagic shock   . Pneumonia    Past Surgical History  Procedure Laterality Date  . Back surgery  2013  . Esophagogastroduodenoscopy N/A 06/04/2014    Procedure: ESOPHAGOGASTRODUODENOSCOPY (EGD);  Surgeon: Jerene Bears, MD;  Location: [facility] END[facility]COPY;  Service: Endoscopy;  Laterality: N/A;   Family History  Problem Relation Age of Onset  . Hypertension Mother   . Hypertension Father   . Colon cancer Neg Hx    History  Substance Use Topics  . Smoking status: Never Smoker   . Smokeless tobacco: Never Used  . Alcohol Use: No    Review of Systems  All other  systems reviewed and are negative.     Allergies  Review of patient's allergies indicates no known allergies.  Home Medications   Prior to Admission medications   Medication Sig Start Date End Date Taking? Authorizing Provider  acetaminophen (TYLENOL) 500 MG tablet Take 500 mg by mouth every 6 (six) hours as needed (pain).   Yes Historical Provider, MD  ALPRAZolam Duanne Moron) 1 MG tablet Take 1 mg by mouth 3 (three) times daily as needed for anxiety.  09/27/14  Yes Historical Provider, MD  omeprazole (PRILOSEC) 40 MG capsule Take 1 capsule (40 mg total) by mouth 2 (two) times daily before a meal. 06/06/14   Adeline C Viyuoh, MD  omeprazole (PRILOSEC) 40 MG capsule Take 1 capsule (40 mg total) by mouth daily. Patient not taking: Reported on 10/09/2014 08/08/14   Jerene Bears, MD   BP 159/115 mmHg  Pulse 94  Temp(Src) 97.9 F (36.6 C) (Oral)  Resp 17  Ht 5\' 6"  (1.676 m)  Wt 200 lb (90.719 kg)  BMI 32.30 kg/m2  SpO2 100% Physical Exam  Constitutional: He appears well-developed and well-nourished. No distress.  HENT:  Head: Normocephalic and atraumatic.  Right Ear: External ear normal.  Left Ear: External ear normal.  Eyes: Conjunctivae are normal. Right eye exhibits no discharge. Left eye exhibits no discharge. No scleral icterus.  Neck: Neck supple. No tracheal deviation present.  Cardiovascular: Normal rate, regular rhythm and intact distal  pulses.   Pulmonary/Chest: Effort normal and breath sounds normal. No stridor. No respiratory distress. He has no wheezes. He has no rales.  Abdominal: Soft. Bowel sounds are normal. He exhibits no distension. There is tenderness in the epigastric area and left lower quadrant. There is guarding. There is no rigidity, no rebound and negative Murphy's sign. No hernia.  Musculoskeletal: He exhibits no edema or tenderness.  Neurological: He is alert. He has normal strength. No cranial nerve deficit (no facial droop, extraocular movements intact, no  slurred speech) or sensory deficit. He exhibits normal muscle tone. He displays no seizure activity. Coordination normal.  Skin: Skin is warm and dry. No rash noted.  Psychiatric: He has a normal mood and affect.  Nursing note and vitals reviewed.   ED Course  Procedures (including critical care time) Labs Review Labs Reviewed  COMPREHENSIVE METABOLIC PANEL - Abnormal; Notable for the following:    Glucose, Bld 108 (*)    Total Protein 8.4 (*)    Anion gap 18 (*)    All other components within normal limits  URINALYSIS, ROUTINE W REFLEX MICROSCOPIC - Abnormal; Notable for the following:    Specific Gravity, Urine 1.002 (*)    All other components within normal limits  CBC WITH DIFFERENTIAL  LIPASE, BLOOD   Medications  0.9 %  sodium chloride infusion (0 mLs Intravenous Stopped 10/09/14 1522)    Followed by  0.9 %  sodium chloride infusion (1,000 mLs Intravenous New Bag/Given 10/09/14 1530)  HYDROmorphone (DILAUDID) injection 1 mg (1 mg Intravenous Given 10/09/14 1532)  ondansetron (ZOFRAN) injection 4 mg (4 mg Intravenous Given 10/09/14 1407)  iohexol (OMNIPAQUE) 300 MG/ML solution 50 mL (50 mLs Oral Contrast Given 10/09/14 1506)  iohexol (OMNIPAQUE) 300 MG/ML solution 100 mL (100 mLs Intravenous Contrast Given 10/09/14 1547)     MDM   Final diagnoses:  Abdominal pain    Labs are reassuring.  With his focal ttp on exam, will ct to evaluate further.  CT scan without acute findings.  No diverticulitis, no abscess.  No obstruction.  At this time there does not appear to be any evidence of an acute emergency medical condition and the patient appears stable for discharge with appropriate outpatient follow up.     Dorie Rank, MD 10/09/14 3716  Dorie Rank, MD 10/09/14 [phone]

## 2014-10-09 NOTE — ED Notes (Addendum)
Patient presents from home where he lives with family.  Patient c/o diffuse abdominal pain x2 days.  Patient endorses nausea, but denies V/D.  Patient's last BM was @ 2 days ago.  Patient denies fever.  Patient denies SOB, chest pain and dizziness.  Patient has a hx of bleeding ulcers and is being followed for anemia by cancer center.  Patient denies dysuria, hematuria, but endorses left flank pain.  Pt endorses anorexia and denies blood in his stool.

## 2014-10-27 ENCOUNTER — Telehealth: Payer: Self-pay | Admitting: Hematology and Oncology

## 2014-10-27 NOTE — Telephone Encounter (Signed)
returned call and s.w pt and confirmed appts °

## 2014-10-31 ENCOUNTER — Other Ambulatory Visit (HOSPITAL_BASED_OUTPATIENT_CLINIC_OR_DEPARTMENT_OTHER): Payer: Medicare Other

## 2014-10-31 DIAGNOSIS — D62 Acute posthemorrhagic anemia: Secondary | ICD-10-CM

## 2014-10-31 DIAGNOSIS — D539 Nutritional anemia, unspecified: Secondary | ICD-10-CM

## 2014-10-31 DIAGNOSIS — D61818 Other pancytopenia: Secondary | ICD-10-CM

## 2014-10-31 LAB — MORPHOLOGY: PLT EST: DECREASED

## 2014-10-31 LAB — CBC & DIFF AND RETIC
BASO%: 0.6 % (ref 0.0–2.0)
Basophils Absolute: 0 10*3/uL (ref 0.0–0.1)
EOS%: 2.1 % (ref 0.0–7.0)
Eosinophils Absolute: 0.1 10*3/uL (ref 0.0–0.5)
HCT: 42.5 % (ref 38.4–49.9)
HGB: 14.4 g/dL (ref 13.0–17.1)
Immature Retic Fract: 10.5 % (ref 3.00–10.60)
LYMPH%: 40.7 % (ref 14.0–49.0)
MCH: 29.1 pg (ref 27.2–33.4)
MCHC: 33.9 g/dL (ref 32.0–36.0)
MCV: 86 fL (ref 79.3–98.0)
MONO#: 0.2 10*3/uL (ref 0.1–0.9)
MONO%: 7.1 % (ref 0.0–14.0)
NEUT#: 1.7 10*3/uL (ref 1.5–6.5)
NEUT%: 49.5 % (ref 39.0–75.0)
Platelets: 128 10*3/uL — ABNORMAL LOW (ref 140–400)
RBC: 4.94 10*6/uL (ref 4.20–5.82)
RDW: 14.7 % — ABNORMAL HIGH (ref 11.0–14.6)
Retic %: 1.81 % — ABNORMAL HIGH (ref 0.80–1.80)
Retic Ct Abs: 89.41 10*3/uL (ref 34.80–93.90)
WBC: 3.4 10*3/uL — ABNORMAL LOW (ref 4.0–10.3)
lymph#: 1.4 10*3/uL (ref 0.9–3.3)

## 2014-10-31 LAB — IRON AND TIBC CHCC
%SAT: 31 % (ref 20–55)
Iron: 103 ug/dL (ref 42–163)
TIBC: 334 ug/dL (ref 202–409)
UIBC: 231 ug/dL (ref 117–376)

## 2014-10-31 LAB — CHCC SMEAR

## 2014-10-31 LAB — FERRITIN CHCC: Ferritin: 85 ng/ml (ref 22–316)

## 2014-10-31 LAB — SEDIMENTATION RATE: Sed Rate: 1 mm/hr (ref 0–16)

## 2014-11-07 ENCOUNTER — Ambulatory Visit: Payer: Medicare Other

## 2014-11-07 ENCOUNTER — Other Ambulatory Visit: Payer: Self-pay | Admitting: Hematology and Oncology

## 2014-11-07 ENCOUNTER — Encounter: Payer: Self-pay | Admitting: Hematology and Oncology

## 2014-11-07 ENCOUNTER — Ambulatory Visit (HOSPITAL_BASED_OUTPATIENT_CLINIC_OR_DEPARTMENT_OTHER): Payer: Medicare Other | Admitting: Hematology and Oncology

## 2014-11-07 VITALS — BP 141/91 | HR 72 | Temp 98.2°F | Resp 20 | Ht 66.0 in | Wt 204.8 lb

## 2014-11-07 DIAGNOSIS — D696 Thrombocytopenia, unspecified: Secondary | ICD-10-CM

## 2014-11-07 DIAGNOSIS — D72819 Decreased white blood cell count, unspecified: Secondary | ICD-10-CM

## 2014-11-07 DIAGNOSIS — D539 Nutritional anemia, unspecified: Secondary | ICD-10-CM

## 2014-11-07 NOTE — Assessment & Plan Note (Signed)
His iron deficiency anemia has resolved. I recommend he reduce iron supplement to once a day and to have his blood work repeated by PCP at the end of the year. I recommend he continue proton pump inhibitor indefinitely

## 2014-11-07 NOTE — Progress Notes (Signed)
[facility] OFFICE PROGRESS NOTE  No primary care provider on file. SUMMARY OF HEMATOLOGIC HISTORY:  He was found to have abnormal CBC from his primary care office after he was recently discharged from the hospital for GI bleed. Total white blood cell count on 06/23/2014 was 2.0. The patient had excessive ingestion of aspirin like products for several years because of daily headaches. He has occasional blurriness of vision and neck pain. The patient also has significant alcoholism for many years. He would drink somewhere around 30-40 drinks per week. He may also have a diagnosis of obstructive sleep apnea. He was having hemoptysis and was hospitalized. He received 3 units of blood transfusion. Endoscopy revealed gastric ulcers. He received iron infusion and was subsequently discharged. He stated he has been abstinent from alcoholism for long time. He complains of intermittent postprandial abdominal pain.  INTERVAL HISTORY: Joseph Fisher 49 y.o. male returns for further follow-up. He is compliant taking oral iron supplement twice a day. He denies recent drinking. The patient denies any recent signs or symptoms of bleeding such as spontaneous epistaxis, hematuria or hematochezia.  I have reviewed the past medical history, past surgical history, social history and family history with the patient and they are unchanged from previous note.  ALLERGIES:  has No Known Allergies.  MEDICATIONS:  Current Outpatient Prescriptions  Medication Sig Dispense Refill  . acetaminophen (TYLENOL) 500 MG tablet Take 500 mg by mouth every 6 (six) hours as needed (pain).    Marland Kitchen ALPRAZolam (XANAX) 1 MG tablet Take 1 mg by mouth 3 (three) times daily as needed for anxiety.   1  . HYDROcodone-acetaminophen (NORCO/VICODIN) 5-325 MG per tablet Take 1-2 tablets by mouth every 4 (four) hours as needed. 20 tablet 0  . omeprazole (PRILOSEC) 40 MG capsule Take 1 capsule (40 mg total) by mouth 2 (two) times  daily before a meal. 60 capsule 0  . omeprazole (PRILOSEC) 40 MG capsule Take 1 capsule (40 mg total) by mouth daily. (Patient not taking: Reported on 10/09/2014) 90 capsule 3   No current facility-administered medications for this visit.     REVIEW OF SYSTEMS:   Constitutional: Denies fevers, chills or night sweats Eyes: Denies blurriness of vision Ears, nose, mouth, throat, and face: Denies mucositis or sore throat Respiratory: Denies cough, dyspnea or wheezes Cardiovascular: Denies palpitation, chest discomfort or lower extremity swelling Gastrointestinal:  Denies nausea, heartburn or change in bowel habits Skin: Denies abnormal skin rashes Lymphatics: Denies new lymphadenopathy or easy bruising Neurological:Denies numbness, tingling or new weaknesses Behavioral/Psych: Mood is stable, no new changes  All other systems were reviewed with the patient and are negative.  PHYSICAL EXAMINATION: ECOG PERFORMANCE STATUS: 0 - Asymptomatic  Filed Vitals:   11/07/14 1004  BP: 141/91  Pulse: 72  Temp: 98.2 F (36.8 C)  Resp: 20   Filed Weights   11/07/14 1004  Weight: 204 lb 12.8 oz (92.897 kg)    GENERAL:alert, no distress and comfortable SKIN: skin color, texture, turgor are normal, no rashes or significant lesions EYES: normal, Conjunctiva are pink and non-injected, sclera clear OROPHARYNX:no exudate, no erythema and lips, buccal mucosa, and tongue normal  NECK: supple, thyroid normal size, non-tender, without nodularity LYMPH:  no palpable lymphadenopathy in the cervical, axillary or inguinal LUNGS: clear to auscultation and percussion with normal breathing effort HEART: regular rate & rhythm and no murmurs and no lower extremity edema ABDOMEN:abdomen soft, non-tender and normal bowel sounds Musculoskeletal:no cyanosis of digits and no clubbing  NEURO: alert & oriented x 3 with fluent speech, no focal motor/sensory deficits  LABORATORY DATA:  I have reviewed the data as  listed No results found for this or any previous visit (from the past 48 hour(s)).  Lab Results  Component Value Date   WBC 3.4* 10/31/2014   HGB 14.4 10/31/2014   HCT 42.5 10/31/2014   MCV 86.0 10/31/2014   PLT 128* 10/31/2014  ASSESSMENT & PLAN:  Leukopenia The cause is unknown. The patient denies recent history of fevers, cough, chills, diarrhea or dysuria. He is asymptomatic from the leukopenia. I will observe for now.    Thrombocytopenia The patient has minimum symptoms. I suspect this could be due to either due to recent infection or mild liver disease. I recommend observation only.    Deficiency anemia His iron deficiency anemia has resolved. I recommend he reduce iron supplement to once a day and to have his blood work repeated by PCP at the end of the year. I recommend he continue proton pump inhibitor indefinitely   All questions were answered. The patient knows to call the clinic with any problems, questions or concerns. No barriers to learning was detected.  I spent 15 minutes counseling the patient face to face. The total time spent in the appointment was 20 minutes and more than 50% was on counseling.     [place], [place], MD 11/07/2014 1:35 PM

## 2014-11-07 NOTE — Assessment & Plan Note (Signed)
The cause is unknown. The patient denies recent history of fevers, cough, chills, diarrhea or dysuria. He is asymptomatic from the leukopenia. I will observe for now.

## 2014-11-07 NOTE — Assessment & Plan Note (Signed)
The patient has minimum symptoms. I suspect this could be due to either due to recent infection or mild liver disease. I recommend observation only.

## 2014-12-13 ENCOUNTER — Emergency Department (HOSPITAL_COMMUNITY): Payer: Medicare Other

## 2014-12-13 ENCOUNTER — Emergency Department (HOSPITAL_COMMUNITY)
Admission: EM | Admit: 2014-12-13 | Discharge: 2014-12-13 | Disposition: A | Discharge status: Home / Self Care | Payer: Medicare Other | Attending: Emergency Medicine | Admitting: Emergency Medicine

## 2014-12-13 ENCOUNTER — Encounter (HOSPITAL_COMMUNITY): Payer: Self-pay

## 2014-12-13 DIAGNOSIS — S3992XA Unspecified injury of lower back, initial encounter: Secondary | ICD-10-CM | POA: Diagnosis not present

## 2014-12-13 DIAGNOSIS — Z8669 Personal history of other diseases of the nervous system and sense organs: Secondary | ICD-10-CM | POA: Diagnosis not present

## 2014-12-13 DIAGNOSIS — Y9389 Activity, other specified: Secondary | ICD-10-CM | POA: Insufficient documentation

## 2014-12-13 DIAGNOSIS — G8929 Other chronic pain: Secondary | ICD-10-CM | POA: Insufficient documentation

## 2014-12-13 DIAGNOSIS — K259 Gastric ulcer, unspecified as acute or chronic, without hemorrhage or perforation: Secondary | ICD-10-CM | POA: Diagnosis not present

## 2014-12-13 DIAGNOSIS — M549 Dorsalgia, unspecified: Secondary | ICD-10-CM

## 2014-12-13 DIAGNOSIS — Z8701 Personal history of pneumonia (recurrent): Secondary | ICD-10-CM | POA: Insufficient documentation

## 2014-12-13 DIAGNOSIS — I10 Essential (primary) hypertension: Secondary | ICD-10-CM | POA: Diagnosis not present

## 2014-12-13 DIAGNOSIS — Z79899 Other long term (current) drug therapy: Secondary | ICD-10-CM | POA: Diagnosis not present

## 2014-12-13 DIAGNOSIS — M25512 Pain in left shoulder: Secondary | ICD-10-CM

## 2014-12-13 DIAGNOSIS — W000XXA Fall on same level due to ice and snow, initial encounter: Secondary | ICD-10-CM | POA: Insufficient documentation

## 2014-12-13 DIAGNOSIS — W19XXXA Unspecified fall, initial encounter: Secondary | ICD-10-CM

## 2014-12-13 DIAGNOSIS — S4992XA Unspecified injury of left shoulder and upper arm, initial encounter: Secondary | ICD-10-CM | POA: Diagnosis not present

## 2014-12-13 DIAGNOSIS — Y998 Other external cause status: Secondary | ICD-10-CM | POA: Insufficient documentation

## 2014-12-13 DIAGNOSIS — Z862 Personal history of diseases of the blood and blood-forming organs and certain disorders involving the immune mechanism: Secondary | ICD-10-CM | POA: Insufficient documentation

## 2014-12-13 DIAGNOSIS — Y9289 Other specified places as the place of occurrence of the external cause: Secondary | ICD-10-CM | POA: Diagnosis not present

## 2014-12-13 DIAGNOSIS — M545 Low back pain: Secondary | ICD-10-CM

## 2014-12-13 MED ORDER — OXYCODONE-ACETAMINOPHEN 5-325 MG PO TABS
2.0000 | ORAL_TABLET | Freq: Once | ORAL | Status: AC
  Administered 2014-12-13: 2 via ORAL
  Filled 2014-12-13: qty 2

## 2014-12-13 MED ORDER — DIAZEPAM 5 MG PO TABS
5.0000 mg | ORAL_TABLET | Freq: Once | ORAL | Status: AC
  Administered 2014-12-13: 5 mg via ORAL
  Filled 2014-12-13: qty 1

## 2014-12-13 NOTE — ED Provider Notes (Signed)
CSN: [id number]     Arrival date & time 12/13/14  1320 History  This chart was scribed for non-physician practitioner, Al Corpus, PA-C working with Evelina Bucy, MD by Einar Pheasant, ED scribe. This patient was seen in room WTR7/WTR7 and the patient's care was started at 1:35 PM.    Chief Complaint  Patient presents with  . Back Pain  . Arm Pain   The history is provided by the patient and medical records. No language interpreter was used.   HPI Comments: Joseph OGBORN is a 49 y.o. male with PMhx of chronic back pain and HTN presents to the Emergency Department complaining of an exacerbation of his chronic back pain after falling on ice last night while taking the trash out. Pt states that he slid down four steps on his back. He is also complaining of left arm pain. Pt describes the pain as "burning" in nature. He reports taking Percocet prescribed by Dr. Annette Stable, his Chronic pain physician, and tylenol with no adequate relief of his symptoms. Last dose of Percocet was at 3 AM today. Pt states that this current back pain feels like his past episodes except the pain is located on the opposite side.  He denies any saddle paraesthesia, weight loss, IV drug use, hx of CA, numbness, weakness, leg pain, nausea, emesis, abdominal pain, fever, chills, diaphoresis, burning with urination, SOB, or CP, head injury or LOC.   Past Medical History  Diagnosis Date  . Hypertension   . Peptic ulcer   . Chronic back pain   . Insomnia   . Snoring 06/20/2014  . Hypoxemia 06/20/2014  . RLS (restless legs syndrome) 06/20/2014  . Anemia, unspecified 07/14/2014  . Unspecified deficiency anemia 07/14/2014  . Blood transfusion without reported diagnosis 05/2014  . Gastric ulcer   . Barrett's esophagus   . GI bleed   . Hemorrhagic shock   . Pneumonia    Past Surgical History  Procedure Laterality Date  . Back surgery  2013  . Esophagogastroduodenoscopy N/A 06/04/2014    Procedure: ESOPHAGOGASTRODUODENOSCOPY  (EGD);  Surgeon: Jerene Bears, MD;  Location: [facility] ENDOSCOPY;  Service: Endoscopy;  Laterality: N/A;   Family History  Problem Relation Age of Onset  . Hypertension Mother   . Hypertension Father   . Colon cancer Neg Hx    History  Substance Use Topics  . Smoking status: Never Smoker   . Smokeless tobacco: Never Used  . Alcohol Use: No    Review of Systems  Constitutional: Negative for fever and chills.  HENT: Negative for congestion.   Respiratory: Negative for shortness of breath.   Cardiovascular: Negative for leg swelling.  Gastrointestinal: Negative for constipation and abdominal distention.  Genitourinary: Negative for urgency, frequency, flank pain and difficulty urinating.  Musculoskeletal: Positive for back pain and arthralgias. Negative for joint swelling and gait problem.  Skin: Negative for rash.  Neurological: Negative for weakness and numbness.   Allergies  Review of patient's allergies indicates no known allergies.  Home Medications   Prior to Admission medications   Medication Sig Start Date End Date Taking? Authorizing Provider  acetaminophen (TYLENOL) 500 MG tablet Take 500 mg by mouth every 6 (six) hours as needed (pain).    Historical Provider, MD  ALPRAZolam Duanne Moron) 1 MG tablet Take 1 mg by mouth 3 (three) times daily as needed for anxiety.  09/27/14   Historical Provider, MD  HYDROcodone-acetaminophen (NORCO/VICODIN) 5-325 MG per tablet Take 1-2 tablets by mouth every 4 (four) hours  as needed. 10/09/14   Dorie Rank, MD  omeprazole (PRILOSEC) 40 MG capsule Take 1 capsule (40 mg total) by mouth 2 (two) times daily before a meal. 06/06/14   Adeline C Viyuoh, MD  omeprazole (PRILOSEC) 40 MG capsule Take 1 capsule (40 mg total) by mouth daily. Patient not taking: Reported on 10/09/2014 08/08/14   Jerene Bears, MD   BP 154/111 mmHg  Pulse 85  Temp(Src) 98.5 F (36.9 C) (Oral)  Resp 16  SpO2 99%  Physical Exam  Constitutional: He appears well-developed and  well-nourished. No distress.  HENT:  Head: Normocephalic and atraumatic.  Eyes: Conjunctivae are normal. Right eye exhibits no discharge. Left eye exhibits no discharge.  Cardiovascular: Normal rate, regular rhythm and normal heart sounds.   Pulmonary/Chest: Effort normal and breath sounds normal. No respiratory distress. He has no wheezes.  Abdominal: Soft. Bowel sounds are normal. He exhibits no distension. There is no tenderness.  Musculoskeletal:  No midline back tenderness, step off or crepitus. Left sided lower back tenderness. No CVA tenderness.   No clavicular step offs or crepitus on the left. No deformity to the left shoulder. Muscle tightness in the distribution of the left trapezius.   Neurological: He is alert. Coordination normal.  Equal muscle tone. 5/5 strength in lower extremities. DTR equal and intact. Negative straight leg test. Normal gait.  Skin: Skin is warm and dry. He is not diaphoretic.  Nursing note and vitals reviewed.   ED Course  Procedures (including critical care time)  DIAGNOSTIC STUDIES: Oxygen Saturation is 99% on RA, normal by my interpretation.    COORDINATION OF CARE: 1:42 PM-Will manage pain here in the ED. Pt advised of plan for treatment and pt agrees.  Labs Review Labs Reviewed - No data to display  Imaging Review Dg Lumbar Spine Complete  12/13/2014   CLINICAL DATA:  Slipped and fell on ice with left-sided back pain without radiculopathy, initial encounter  EXAM: LUMBAR SPINE - COMPLETE 4+ VIEW  COMPARISON:  None.  FINDINGS: Five lumbar type vertebral bodies are well visualized. Fusion is identified at L4-5 with pedicular screws and posterior fixation. Mild disc space narrowing is noted at L5-S1. No pars defects are seen. No other focal abnormality is noted.  IMPRESSION: Postsurgical changes.  Mild degenerative change without acute abnormality.   Electronically Signed   By: Inez Catalina M.D.   On: 12/13/2014 14:32   Dg Shoulder  Left  12/13/2014   CLINICAL DATA:  49 year old male who slipped on the ice downstairs. Acute pain. Initial encounter.  EXAM: LEFT SHOULDER - 2+ VIEW  COMPARISON:  None.  FINDINGS: No glenohumeral joint dislocation. Proximal left humerus intact. Visible left clavicle and scapula appear intact. Visible left ribs and lung parenchyma within normal limits.  IMPRESSION: No acute fracture or dislocation identified about the left shoulder.   Electronically Signed   By: Genevie Ann M.D.   On: 12/13/2014 14:26     EKG Interpretation None      MDM   Final diagnoses:  Fall, initial encounter  Left low back pain, with sciatica presence unspecified  Essential hypertension  Left shoulder pain   Patient with back pain. No loss of bowel or bladder control. No saddle anesthesia. VSS. No neurological deficits and normal neuro exam. Patient can walk but states is painful. No concern for cauda equina. Patient with chronic back pain with the pain clinic and understands that pain medicines could bilateral his pain contract. He is receptive and understanding to  this. No outpatient narcotic prescription provided. Patient also with left shoulder pain neurovascularly intact. X-ray negative. Rice and NSAID protocol discussed. Patient also found to be hypertensive in the emergency room. No evidence of hypertensive urgency. Patient with history but no primary care provider or medication. Patient given referral to the wellness Center. Patient is afebrile, nontoxic, and in no acute distress. Patient is appropriate for outpatient management and is stable for discharge.  Discussed return precautions with patient. Discussed all results and patient verbalizes understanding and agrees with plan.  I personally performed the services described in this documentation, which was scribed in my presence. The recorded information has been reviewed and is accurate.   Pura Spice, PA-C 12/13/14 2026  Pura Spice, PA-C 12/13/14  2026  Evelina Bucy, MD 12/14/14 [phone]

## 2014-12-13 NOTE — ED Notes (Signed)
Pt fell d/t ice last night.  Left arm and back pain.  No LOC or head trauma noted

## 2014-12-13 NOTE — Discharge Instructions (Signed)
Return to the emergency room with worsening of symptoms, new symptoms or with symptoms that are concerning , especially fevers, loss of control of bladder or bowels, numbness or tingling around genital region or anus, weakness. RICE: Rest, Ice (three cycles of 20 mins on, 89mns off at least twice a day), compression/brace, elevation. Heating pad works well for back pain. Ibuprofen 4075m(2 tablets 20013mevery 5-6 hours for 3-5 days. Follow up with PCP/orthopedist if symptoms worsen or are persistent. Read below information and follow recommendations.  Back Injury Prevention Back injuries can be extremely painful and difficult to heal. After having one back injury, you are much more likely to experience another later on. It is important to learn how to avoid injuring or re-injuring your back. The following tips can help you to prevent a back injury. PHYSICAL FITNESS  Exercise regularly and try to develop good tone in your abdominal muscles. Your abdominal muscles provide a lot of the support needed by your back.  Do aerobic exercises (walking, jogging, biking, swimming) regularly.  Do exercises that increase balance and strength (tai chi, yoga) regularly. This can decrease your risk of falling and injuring your back.  Stretch before and after exercising.  Maintain a healthy weight. The more you weigh, the more stress is placed on your back. For every pound of weight, 10 times that amount of pressure is placed on the back. DIET  Talk to your caregiver about how much calcium and vitamin D you need per day. These nutrients help to prevent weakening of the bones (osteoporosis). Osteoporosis can cause broken (fractured) bones that lead to back pain.  Include good sources of calcium in your diet, such as dairy products, green, leafy vegetables, and products with calcium added (fortified).  Include good sources of vitamin D in your diet, such as milk and foods that are fortified with vitamin  D.  Consider taking a nutritional supplement or a multivitamin if needed.  Stop smoking if you smoke. POSTURE  Sit and stand up straight. Avoid leaning forward when you sit or hunching over when you stand.  Choose chairs with good low back (lumbar) support.  If you work at a desk, sit close to your work so you do not need to lean over. Keep your chin tucked in. Keep your neck drawn back and elbows bent at a right angle. Your arms should look like the letter "L."  Sit high and close to the steering wheel when you drive. Add a lumbar support to your car seat if needed.  Avoid sitting or standing in one position for too long. Take breaks to get up, stretch, and walk around at least once every hour. Take breaks if you are driving for long periods of time.  Sleep on your side with your knees slightly bent, or sleep on your back with a pillow under your knees. Do not sleep on your stomach. LIFTING, TWISTING, AND REACHING  Avoid heavy lifting, especially repetitive lifting. If you must do heavy lifting:  Stretch before lifting.  Work slowly.  Rest between lifts.  Use carts and dollies to move objects when possible.  Make several small trips instead of carrying 1 heavy load.  Ask for help when you need it.  Ask for help when moving big, awkward objects.  Follow these steps when lifting:  Stand with your feet shoulder-width apart.  Get as close to the object as you can. Do not try to pick up heavy objects that are far from your body.  Use handles or lifting straps if they are available.  Bend at your knees. Squat down, but keep your heels off the floor.  Keep your shoulders pulled back, your chin tucked in, and your back straight.  Lift the object slowly, tightening the muscles in your legs, abdomen, and buttocks. Keep the object as close to the center of your body as possible.  When you put a load down, use these same guidelines in reverse.  Do not:  Lift the object  above your waist.  Twist at the waist while lifting or carrying a load. Move your feet if you need to turn, not your waist.  Bend over without bending at your knees.  Avoid reaching over your head, across a table, or for an object on a high surface. OTHER TIPS  Avoid wet floors and keep sidewalks clear of ice to prevent falls.  Do not sleep on a mattress that is too soft or too hard.  Keep items that are used frequently within easy reach.  Put heavier objects on shelves at waist level and lighter objects on lower or higher shelves.  Find ways to decrease your stress, such as exercise, massage, or relaxation techniques. Stress can build up in your muscles. Tense muscles are more vulnerable to injury.  Seek treatment for depression or anxiety if needed. These conditions can increase your risk of developing back pain. SEEK MEDICAL CARE IF:  You injure your back.  You have questions about diet, exercise, or other ways to prevent back injuries. MAKE SURE YOU:  Understand these instructions.  Will watch your condition.  Will get help right away if you are not doing well or get worse. Document Released: 11/21/2004 Document Revised: 01/06/2012 Document Reviewed: 11/25/2011 [facility] Patient Information 2015 [place], [place]. This information is not intended to replace advice given to you by your health care provider. Make sure you discuss any questions you have with your health care provider.

## 2015-05-10 ENCOUNTER — Other Ambulatory Visit: Payer: Self-pay | Admitting: Internal Medicine

## 2015-07-10 ENCOUNTER — Encounter: Payer: Self-pay | Admitting: Physician Assistant

## 2015-07-20 ENCOUNTER — Encounter: Payer: Self-pay | Admitting: Gastroenterology

## 2015-07-27 ENCOUNTER — Ambulatory Visit (INDEPENDENT_AMBULATORY_CARE_PROVIDER_SITE_OTHER): Payer: Medicare Other | Admitting: Gastroenterology

## 2015-07-27 ENCOUNTER — Ambulatory Visit: Payer: Medicare Other | Admitting: Gastroenterology

## 2015-07-27 ENCOUNTER — Other Ambulatory Visit (INDEPENDENT_AMBULATORY_CARE_PROVIDER_SITE_OTHER): Payer: Medicare Other

## 2015-07-27 ENCOUNTER — Encounter: Payer: Self-pay | Admitting: Gastroenterology

## 2015-07-27 VITALS — BP 130/80 | HR 80 | Ht 66.0 in | Wt 214.4 lb

## 2015-07-27 DIAGNOSIS — K2270 Barrett's esophagus without dysplasia: Secondary | ICD-10-CM | POA: Diagnosis not present

## 2015-07-27 DIAGNOSIS — R140 Abdominal distension (gaseous): Secondary | ICD-10-CM

## 2015-07-27 LAB — IGA: IgA: 140 mg/dL (ref 68–378)

## 2015-07-27 NOTE — Patient Instructions (Signed)
Your physician has requested that you go to the basement for the following lab work before leaving today: IGA, TTG  Start FDgard twice a day.  Follow up with Dr. Billie Lade on 09-26-2015 @ 9:30am

## 2015-07-27 NOTE — Progress Notes (Addendum)
     07/27/2015 Joseph Fisher [id number] Jul 19, 1966   History of Present Illness:  This is a 49 year old male who is known to Dr. Hilarie Fredrickson only for endoscopy. In August 2015 he had an EGD for upper GI bleeding. Was found have 4 ulcers in the stomach and Barrett's esophagus. Repeat EGD was performed in October 2015 at which time he was found to have healed ulcers with only some gastritis and suspected Barrett's esophagus. Gastric biopsies showed surface erosion, but no H. pylori. Several biopsies of the esophagus confirmed Barrett's without dysplasia. It is recommended he have a repeat EGD in 3 years from that time.  He remains on omeprazole 40 mg BID; says that heartburn/reflux is well controlled.  He presents to our office today with the sole complaint of bloating.  He denies any nausea, vomiting, constipation, diarrhea, dark or bloody stools, abdominal pain, weight loss, heartburn/reflux, etc. Was heme negative at PCP's office and CBC and CMP was WNL's.  He has not tried any OTC regimens.  Says that bloating is present no matter what he eats.    Of note, he did have a CT scan of the abdomen and pelvis with contrast December 2015 which time he was found have a few left colon diverticula without diverticulitis and some degenerative changes of the spine including L4-L5 posterior fusion, but no other abnormalities were identified.   Current Medications, Allergies, Past Medical History, Past Surgical History, Family History and Social History were reviewed in [employer] record.   Physical Exam: BP 130/80 mmHg  Pulse 80  Ht 5\' 6"  (1.676 m)  Wt 214 lb 6 oz (97.24 kg)  BMI 34.62 kg/m2 General: Well developed white male in no acute distress Head: Normocephalic and atraumatic Eyes:  Sclerae anicteric, conjunctiva pink  Ears: Normal auditory acuity Lungs: Clear throughout to auscultation Heart: Regular rate and rhythm Abdomen: Soft, non-distended.  Normal bowel sounds.   Non-tender. Musculoskeletal: Symmetrical with no gross deformities  Extremities: No edema  Neurological: Alert oriented x 4, grossly non-focal Psychological:  Alert and cooperative. Normal mood and affect  Assessment and Recommendations: -Bloating:  Non-specific.  No other associated symptoms.  Likely dietary related.  ? IBS/SIBO.  Will check celiac labs.  Will just try some OTC regimen for now since he has not attempt any self treatment at this point.  Will try FDgard BID prn.  ? Considering course of flagyl or Xifaxan.  Will try to keep food diary.  -Barrett's esophagus/GERD:  Continue omeprazole 40 mg BID for now.  Due for repeat EGD 07/2017.  *Follow-up in approximately 6 weeks.  Addendum: Reviewed and agree with initial management. Jerene Bears, MD

## 2015-07-28 ENCOUNTER — Telehealth: Payer: Self-pay | Admitting: Neurology

## 2015-07-28 LAB — TISSUE TRANSGLUTAMINASE, IGA: Tissue Transglutaminase Ab, IgA: 1 U/mL (ref <4)

## 2015-07-28 NOTE — Telephone Encounter (Signed)
Dr. Brett Fairy patient, erroneously placed on my schedule for new sleep consult for which he has already seen Dr. Brett Fairy. Please make FU with Dr. Keturah Barre, thx

## 2015-07-31 ENCOUNTER — Institutional Professional Consult (permissible substitution): Payer: Medicare Other | Admitting: Neurology

## 2015-07-31 NOTE — Telephone Encounter (Signed)
Called pt to make an appt with Dr. Brett Fairy, left message asking pt to call back. When pt calls back, please place him in a 30 minute office visit with Dr. Brett Fairy.

## 2015-08-17 ENCOUNTER — Ambulatory Visit: Payer: Medicare Other | Admitting: Gastroenterology

## 2015-08-22 ENCOUNTER — Institutional Professional Consult (permissible substitution): Payer: Medicare Other | Admitting: Neurology

## 2015-08-30 ENCOUNTER — Encounter: Payer: Self-pay | Admitting: *Deleted

## 2015-09-04 ENCOUNTER — Telehealth: Payer: Self-pay | Admitting: Neurology

## 2015-09-04 NOTE — Telephone Encounter (Signed)
This patient is scheduled for a sleep study.  Can I get an order for a split sleep study?

## 2015-09-04 NOTE — Telephone Encounter (Signed)
Spoke to Dr. Brett Fairy. Pt needs to be seen in the office again since it has been over a year since he was seen by her. Pt has cancelled the last two scheduled appts with [place]. Pt cannot have sleep study without having an appt with Dr. Brett Fairy first. Please cancel sleep study and schedule him for an office visit with Dr. Brett Fairy.

## 2015-09-05 ENCOUNTER — Telehealth: Payer: Self-pay | Admitting: Neurology

## 2015-09-05 DIAGNOSIS — G4700 Insomnia, unspecified: Secondary | ICD-10-CM

## 2015-09-05 DIAGNOSIS — R0683 Snoring: Secondary | ICD-10-CM

## 2015-09-05 DIAGNOSIS — G4730 Sleep apnea, unspecified: Secondary | ICD-10-CM

## 2015-09-05 NOTE — Telephone Encounter (Signed)
Pt had consult 06/20/14 but was never contacted for sleep study, spoke with Dr. Brett Fairy and she said okay with scheduling him for sleep study only gna lsm

## 2015-09-19 ENCOUNTER — Institutional Professional Consult (permissible substitution): Payer: Medicare Other | Admitting: Neurology

## 2015-09-26 ENCOUNTER — Encounter: Payer: Self-pay | Admitting: Internal Medicine

## 2015-09-26 ENCOUNTER — Ambulatory Visit (INDEPENDENT_AMBULATORY_CARE_PROVIDER_SITE_OTHER): Payer: Medicare Other | Admitting: Internal Medicine

## 2015-09-26 VITALS — BP 132/100 | HR 68 | Ht 66.0 in | Wt 222.1 lb

## 2015-09-26 DIAGNOSIS — Z1211 Encounter for screening for malignant neoplasm of colon: Secondary | ICD-10-CM | POA: Diagnosis not present

## 2015-09-26 DIAGNOSIS — K2270 Barrett's esophagus without dysplasia: Secondary | ICD-10-CM

## 2015-09-26 DIAGNOSIS — R140 Abdominal distension (gaseous): Secondary | ICD-10-CM

## 2015-09-26 MED ORDER — METRONIDAZOLE 250 MG PO TABS: 250.0000 mg | ORAL_TABLET | Freq: Three times a day (TID) | ORAL | Status: DC

## 2015-09-26 NOTE — Progress Notes (Signed)
Subjective:    Patient ID: Joseph Fisher, male    DOB: [birth year], 49 y.o.   MRN: [id number]  HPI Katron Scherzer is a 49 year old male with past medical history of GERD, Barrett's esophagus, gastric ulcers, and abdominal bloating is seen for follow-up. He also has a history of hypertension, chronic back pain on narcotics, restless leg.  He was seen 2 months ago by Alonza Bogus, PA-C to evaluate abdominal bloating. He reports that he continues to feel upper abdominal bloating and pressure after eating but he is not having pain. This is not a major concern for him but is present on most days. He denies increased belching or flatulence. Bowel movements a been regular without constipation or diarrhea. He denies blood in his stool or melena. He denies heartburn, dysphagia, nausea and vomiting. Denies early satiety. Denies weight loss. He does take oxycodone recently increased to 3 times daily for chronic back pain. He is taking omeprazole 40 mg twice daily. He did not try over-the-counter FDgard.   Review of Systems As per history of present illness, otherwise negative  Current Medications, Allergies, Past Medical History, Past Surgical History, Family History and Social History were reviewed in [employer] record.     Objective:   Physical Exam BP 132/100 mmHg  Pulse 68  Ht 5\' 6"  (1.676 m)  Wt 222 lb 2 oz (100.755 kg)  BMI 35.87 kg/m2 Constitutional: Well-developed and well-nourished. No distress. HEENT: Normocephalic and atraumatic. Oropharynx is clear and moist. No oropharyngeal exudate. Conjunctivae are normal.  No scleral icterus. Neck: Neck supple. Trachea midline. Cardiovascular: Normal rate, regular rhythm and intact distal pulses. No M/R/G Pulmonary/chest: Effort normal and breath sounds normal. No wheezing, rales or rhonchi. Abdominal: Soft, nontender, mildly distended/obese without ascites. Bowel sounds active throughout.  Extremities: no clubbing, cyanosis,  or edema Neurological: Alert and oriented to person place and time. Skin: Skin is warm and dry. No rashes noted. Psychiatric: Normal mood and affect. Behavior is normal.  CBC    Component Value Date/Time   WBC 3.4* 10/31/2014 1015   WBC 5.3 10/09/2014 1409   RBC 4.94 10/31/2014 1015   RBC 5.56 10/09/2014 1409   HGB 14.4 10/31/2014 1015   HGB 16.3 10/09/2014 1409   HCT 42.5 10/31/2014 1015   HCT 46.9 10/09/2014 1409   PLT 128* 10/31/2014 1015   PLT 153 10/09/2014 1409   MCV 86.0 10/31/2014 1015   MCV 84.4 10/09/2014 1409   MCH 29.1 10/31/2014 1015   MCH 29.3 10/09/2014 1409   MCHC 33.9 10/31/2014 1015   MCHC 34.8 10/09/2014 1409   RDW 14.7* 10/31/2014 1015   RDW 15.2 10/09/2014 1409   LYMPHSABS 1.4 10/31/2014 1015   LYMPHSABS 1.4 10/09/2014 1409   MONOABS 0.2 10/31/2014 1015   MONOABS 0.4 10/09/2014 1409   EOSABS 0.1 10/31/2014 1015   EOSABS 0.0 10/09/2014 1409   BASOSABS 0.0 10/31/2014 1015   BASOSABS 0.0 10/09/2014 1409    CMP     Component Value Date/Time   NA 137 10/09/2014 1409   K 4.3 10/09/2014 1409   CL 96 10/09/2014 1409   CO2 23 10/09/2014 1409   GLUCOSE 108* 10/09/2014 1409   BUN 11 10/09/2014 1409   CREATININE 0.91 10/09/2014 1409   CALCIUM 10.2 10/09/2014 1409   PROT 8.4* 10/09/2014 1409   ALBUMIN 5.0 10/09/2014 1409   AST 18 10/09/2014 1409   ALT 19 10/09/2014 1409   ALKPHOS 47 10/09/2014 1409   BILITOT 0.8 10/09/2014 1409  GFRNONAA >90 10/09/2014 1409   GFRAA >90 10/09/2014 1409    Celiac panel neg  CT ABDOMEN AND PELVIS WITH CONTRAST   TECHNIQUE: Multidetector CT imaging of the abdomen and pelvis was performed using the standard protocol following bolus administration of intravenous contrast.   CONTRAST:  67mL OMNIPAQUE IOHEXOL 300 MG/ML SOLN, 124mL OMNIPAQUE IOHEXOL 300 MG/ML SOLN   COMPARISON:  05/28/2014   FINDINGS: Lung bases.  Heart normal size.   Liver, spleen, gallbladder, pancreas, adrenal glands, kidneys, ureters,  bladder: Normal.   No adenopathy.  No abnormal fluid collections.   Few left colon diverticula. No diverticulitis. Colon otherwise unremarkable. Normal small bowel. Normal appendix.   Status post L4-L5 posterior lumbar spine fusion. There are degenerative changes of the lower thoracic and lumbar spine. No osteoblastic or osteolytic lesions.   IMPRESSION: 1. No acute findings. 2. Few left colon diverticula. No diverticulitis. No other bowel abnormality. Normal appendix visualized. 3. Degenerative changes of the visualized spine and postsurgical changes from an L4-L5 posterior fusion. 4. No other abnormalities.     Electronically Signed   By: Lajean Manes M.D.   On: 10/09/2014 16:16        Assessment & Plan:  49 year old male with past medical history of GERD, Barrett's esophagus, gastric ulcers, and abdominal bloating is seen for follow-up.  1. Abd bloating -- my suspicion is that he has some impaired gut motility related to chronic narcotic use for back pain. Cannot exclude bacterial overgrowth. We'll treat empirically for bacterial overgrowth with Flagyl 250 mg 3 times a day 7 days. Call me in one month to see if symptoms have improved. Low gas diet reviewed and recommended.  2. GERD with Barrett's esophagus -- continue PPI, surveillance recommended 2018  3. Colorectal cancer screening -- he will be 38 in 2017, colonoscopy recommended in 2017 for screening

## 2015-09-26 NOTE — Patient Instructions (Signed)
We have sent the following medications to your pharmacy for you to pick up at your convenience: Flagyl 250 mg three times daily x 7 days  We have given you a brochure regarding gas prevention to read over.  Call our office in 1 month with an update on your bloating/gas.  You will be due for a recall colonoscopy in 2017. We will send you a reminder in the mail when it gets closer to that time.  Follow up with Dr Hilarie Fredrickson as needed.

## 2015-11-03 ENCOUNTER — Other Ambulatory Visit: Payer: Self-pay | Admitting: Internal Medicine

## 2016-05-03 ENCOUNTER — Encounter: Payer: Self-pay | Admitting: Internal Medicine

## 2016-05-08 IMAGING — CT CT ABD-PELV W/ CM
1 of 3 series · 14 of 32 positions shown, 19 images · IV contrast (OMNIPAQUE 300)
Comparison: 05/28/2014

CLINICAL DATA: Patient c/o diffuse abdominal pain x2 days. Patient
endorses nausea, but denies V/D. Patient's last BM was @ 2 days ago.
Patient denies fever. Patient denies SOB, chest pain and dizziness.
Patient has a hx of bleeding ulcers and is being followed for anemia
by [HOSPITAL]. Patient denies dysuria, hematuria, but endorses
left flank pain. Pt endorses anorexia and denies blood in his stool.

EXAM:
CT ABDOMEN AND PELVIS WITH CONTRAST
TECHNIQUE: Multidetector CT imaging of the abdomen and pelvis was performed
using the standard protocol following bolus administration of
intravenous contrast.
CONTRAST:  50mL OMNIPAQUE IOHEXOL 300 MG/ML SOLN, 100mL OMNIPAQUE
IOHEXOL 300 MG/ML SOLN

[Series 2: abd/pel with · axial · 0.74mm/px · z∈[+882,+1337]mm · 14 of 103 slices shown, 19 images]
[im 6/103  soft-tissue]
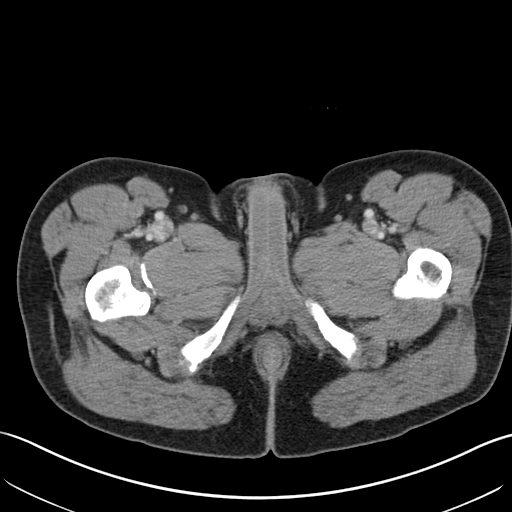
[im 6/103  bone]
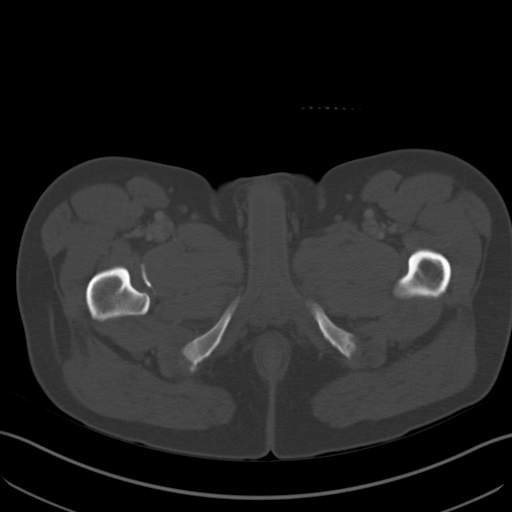
[im 16/103  soft-tissue]
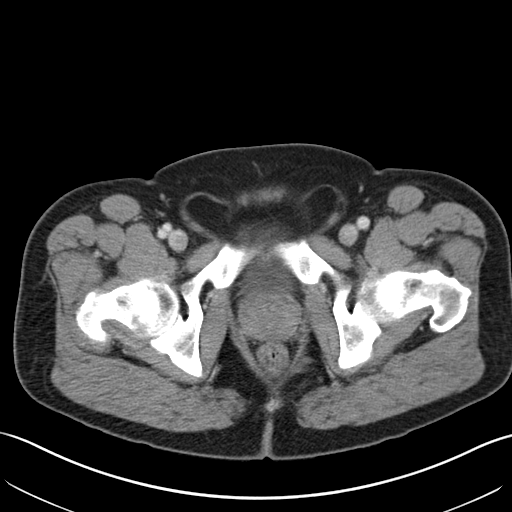
[im 21/103  soft-tissue]
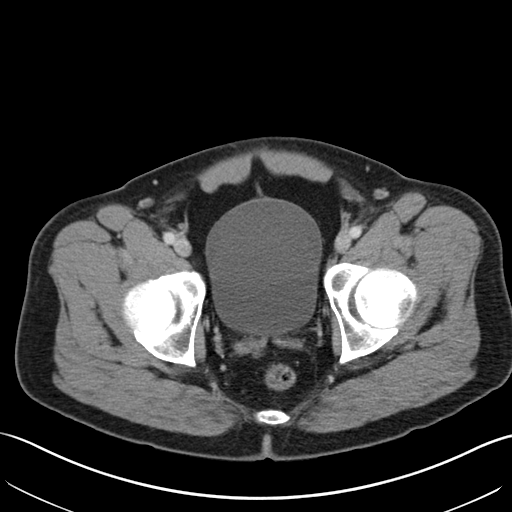
[im 31/103  soft-tissue]
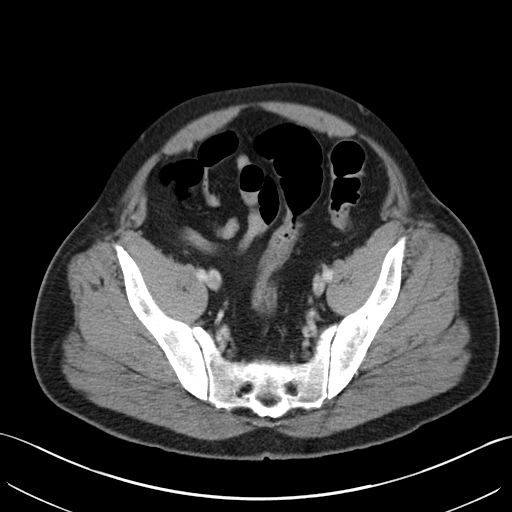
[im 36/103  soft-tissue]
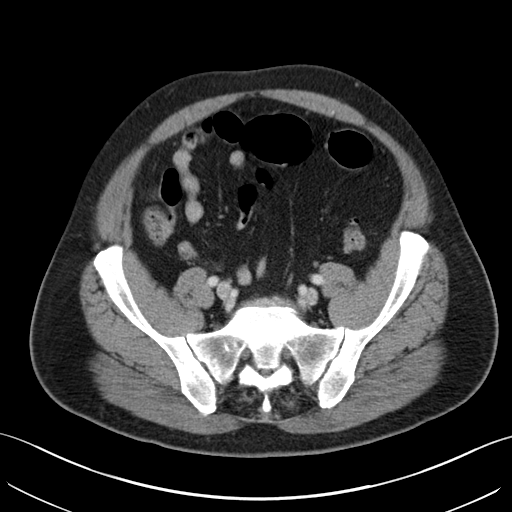
[im 46/103  soft-tissue]
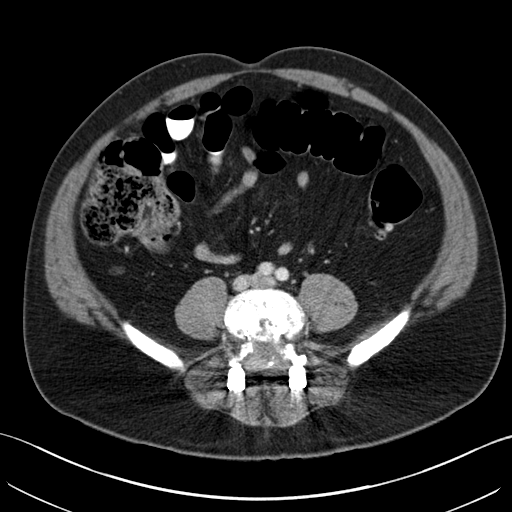
[im 52/103  soft-tissue]
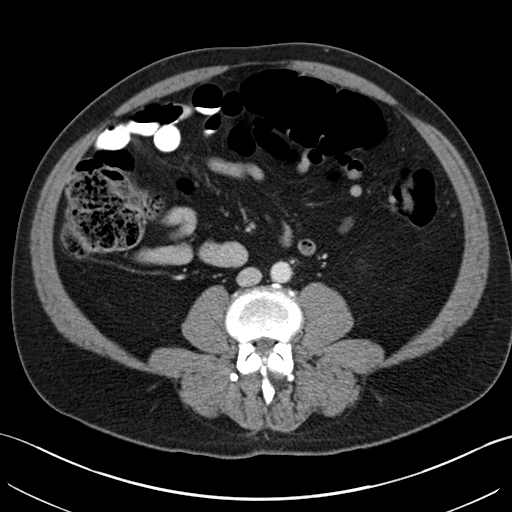
[im 57/103  soft-tissue]
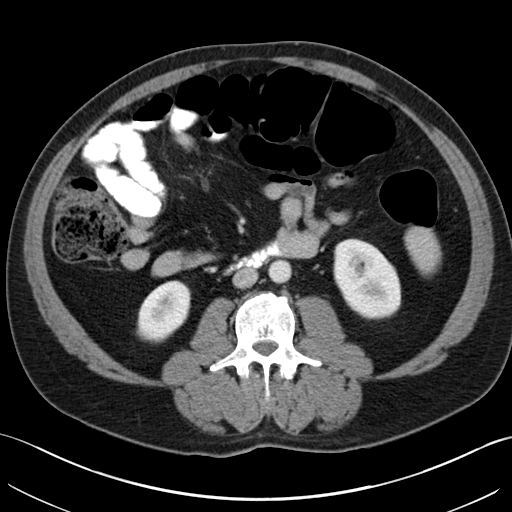
[im 67/103  soft-tissue]
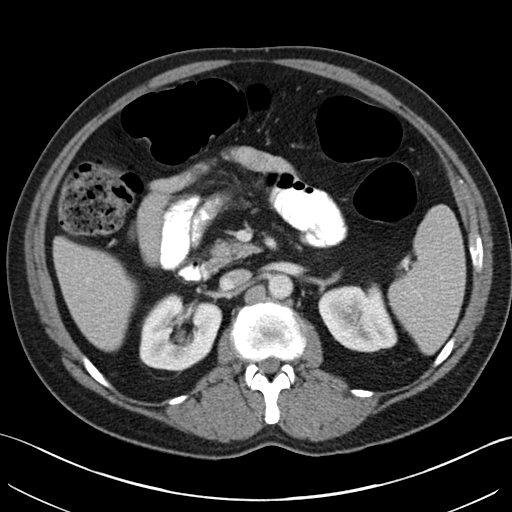
[im 67/103  bone]
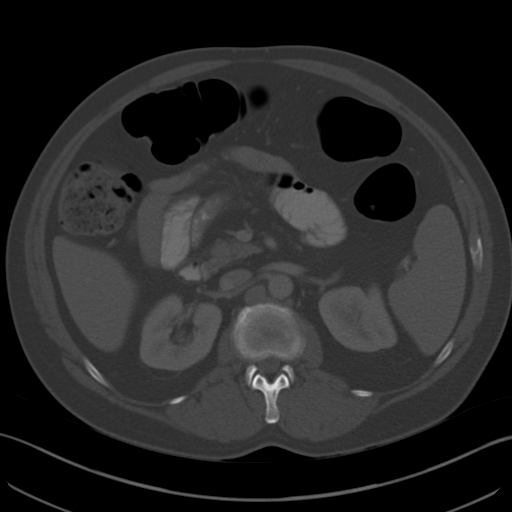
[im 72/103  soft-tissue]
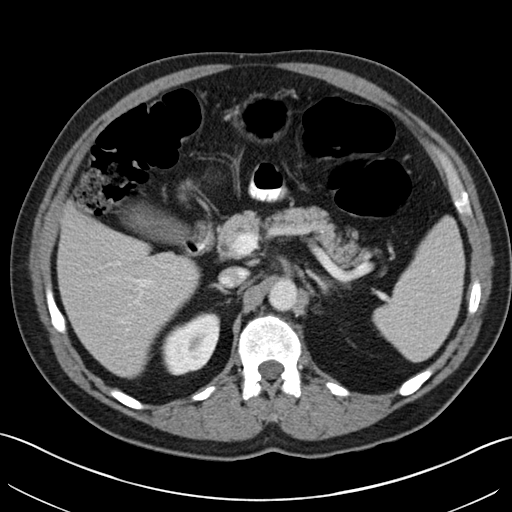
[im 82/103  soft-tissue]
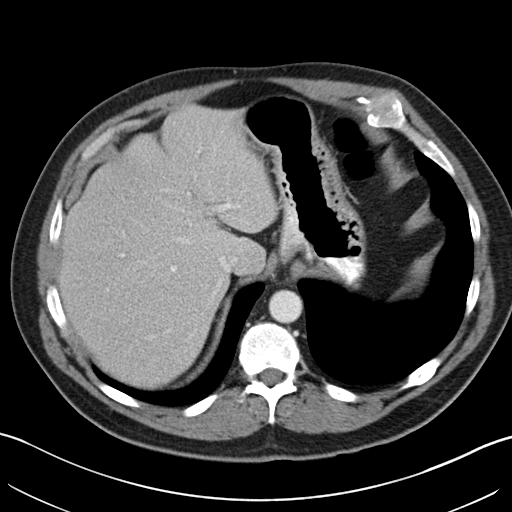
[im 82/103  lung]
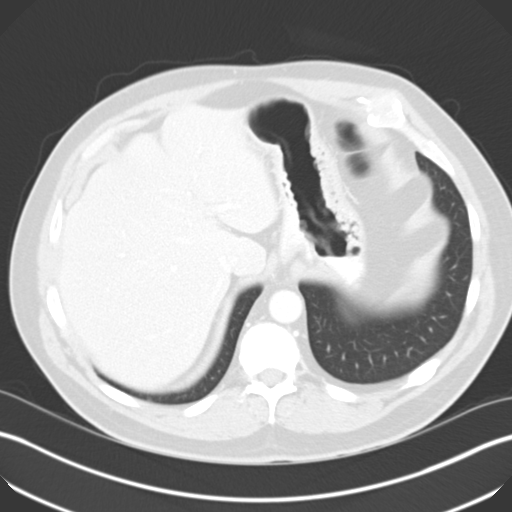
[im 87/103  soft-tissue]
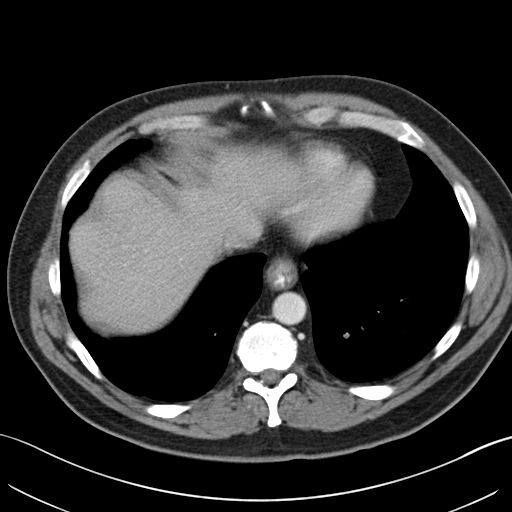
[im 87/103  lung]
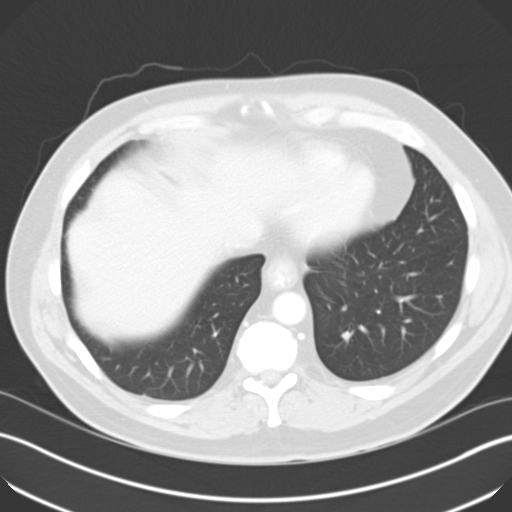
[im 92/103  lung]
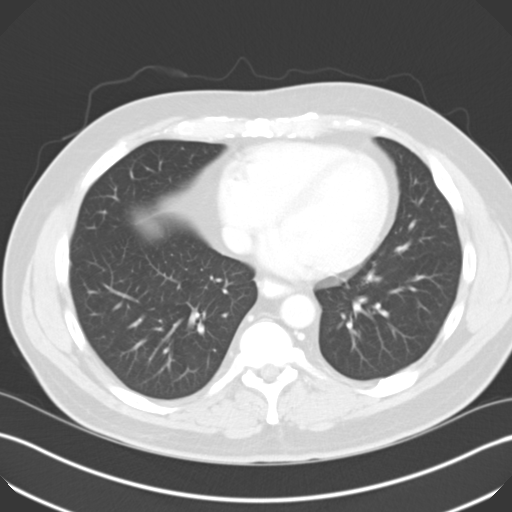
[im 97/103  soft-tissue]
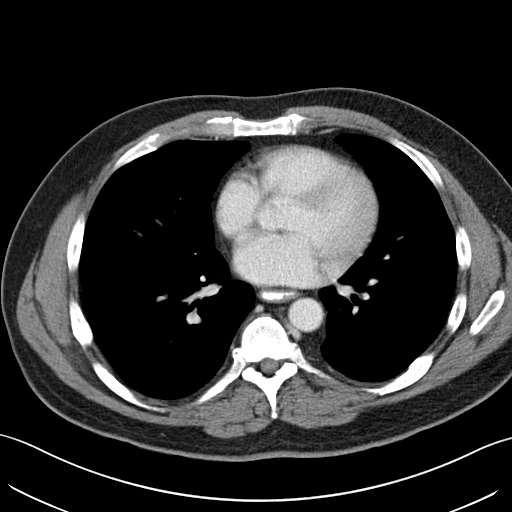
[im 97/103  lung]
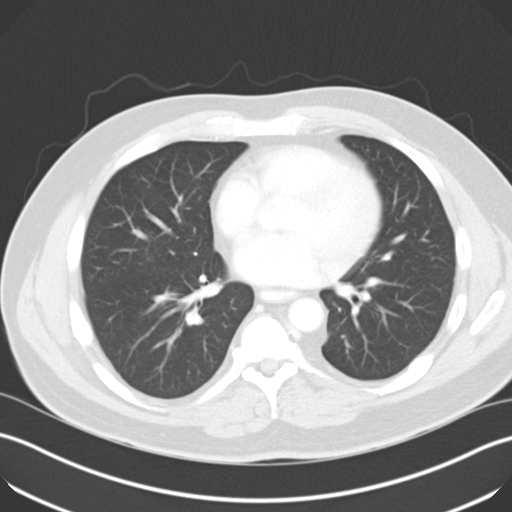

[14 of 32 positions shown; findings below may reference images not displayed]

FINDINGS: Lung bases.  Heart normal size.

Liver, spleen, gallbladder, pancreas, adrenal glands, kidneys,
ureters, bladder: Normal.

No adenopathy.  No abnormal fluid collections.

Few left colon diverticula. No diverticulitis. Colon otherwise
unremarkable. Normal small bowel. Normal appendix.

Status post L4-L5 posterior lumbar spine fusion. There are
degenerative changes of the lower thoracic and lumbar spine. No
osteoblastic or osteolytic lesions.
IMPRESSION: 1. No acute findings.
2. Few left colon diverticula. No diverticulitis. No other bowel
abnormality. Normal appendix visualized.
3. Degenerative changes of the visualized spine and postsurgical
changes from an L4-L5 posterior fusion.
4. No other abnormalities.

## 2016-08-09 ENCOUNTER — Other Ambulatory Visit: Payer: Self-pay | Admitting: Internal Medicine

## 2016-12-11 ENCOUNTER — Emergency Department (HOSPITAL_COMMUNITY)
Admission: EM | Admit: 2016-12-11 | Discharge: 2016-12-11 | Disposition: A | Discharge status: Home / Self Care | Payer: No Typology Code available for payment source | Attending: Emergency Medicine | Admitting: Emergency Medicine

## 2016-12-11 ENCOUNTER — Emergency Department (HOSPITAL_COMMUNITY): Payer: No Typology Code available for payment source

## 2016-12-11 ENCOUNTER — Encounter (HOSPITAL_COMMUNITY): Payer: Self-pay | Admitting: Emergency Medicine

## 2016-12-11 DIAGNOSIS — Z79899 Other long term (current) drug therapy: Secondary | ICD-10-CM | POA: Insufficient documentation

## 2016-12-11 DIAGNOSIS — Y939 Activity, unspecified: Secondary | ICD-10-CM | POA: Diagnosis not present

## 2016-12-11 DIAGNOSIS — M545 Low back pain: Secondary | ICD-10-CM | POA: Insufficient documentation

## 2016-12-11 DIAGNOSIS — M546 Pain in thoracic spine: Secondary | ICD-10-CM | POA: Insufficient documentation

## 2016-12-11 DIAGNOSIS — Y999 Unspecified external cause status: Secondary | ICD-10-CM | POA: Diagnosis not present

## 2016-12-11 DIAGNOSIS — Y92410 Unspecified street and highway as the place of occurrence of the external cause: Secondary | ICD-10-CM | POA: Diagnosis not present

## 2016-12-11 DIAGNOSIS — I10 Essential (primary) hypertension: Secondary | ICD-10-CM | POA: Diagnosis not present

## 2016-12-11 DIAGNOSIS — M7918 Myalgia, other site: Secondary | ICD-10-CM

## 2016-12-11 MED ORDER — IBUPROFEN 200 MG PO TABS
600.0000 mg | ORAL_TABLET | Freq: Once | ORAL | Status: AC
  Administered 2016-12-11: 600 mg via ORAL
  Filled 2016-12-11: qty 3

## 2016-12-11 MED ORDER — IBUPROFEN 600 MG PO TABS: 600.0000 mg | ORAL_TABLET | Freq: Four times a day (QID) | ORAL | 0 refills | Status: DC | PRN

## 2016-12-11 MED ORDER — METHOCARBAMOL 500 MG PO TABS: 500.0000 mg | ORAL_TABLET | Freq: Two times a day (BID) | ORAL | 0 refills | Status: DC

## 2016-12-11 MED ORDER — METHOCARBAMOL 500 MG PO TABS
1000.0000 mg | ORAL_TABLET | Freq: Once | ORAL | Status: AC
  Administered 2016-12-11: 1000 mg via ORAL
  Filled 2016-12-11: qty 2

## 2016-12-11 NOTE — ED Notes (Signed)
Pt ambulatory and independent at discharge.  Verbalized understanding of discharge instructions 
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
## 2017-02-01 ENCOUNTER — Other Ambulatory Visit: Payer: Self-pay | Admitting: Internal Medicine

## 2017-02-16 ENCOUNTER — Other Ambulatory Visit: Payer: Self-pay | Admitting: Internal Medicine

## 2017-02-23 ENCOUNTER — Other Ambulatory Visit: Payer: Self-pay | Admitting: Internal Medicine

## 2017-02-28 ENCOUNTER — Telehealth: Payer: Self-pay | Admitting: Internal Medicine

## 2017-02-28 MED ORDER — OMEPRAZOLE 40 MG PO CPDR: 40.0000 mg | DELAYED_RELEASE_CAPSULE | Freq: Two times a day (BID) | ORAL | 0 refills | Status: DC

## 2017-02-28 NOTE — Telephone Encounter (Signed)
Rx sent 

## 2017-03-27 ENCOUNTER — Other Ambulatory Visit: Payer: Self-pay | Admitting: Internal Medicine

## 2017-04-14 ENCOUNTER — Encounter: Payer: Self-pay | Admitting: Internal Medicine

## 2017-04-14 ENCOUNTER — Ambulatory Visit (INDEPENDENT_AMBULATORY_CARE_PROVIDER_SITE_OTHER): Payer: Medicare Other | Admitting: Internal Medicine

## 2017-04-14 ENCOUNTER — Encounter (INDEPENDENT_AMBULATORY_CARE_PROVIDER_SITE_OTHER): Payer: Self-pay

## 2017-04-14 VITALS — BP 128/88 | HR 67 | Ht 66.0 in | Wt 215.0 lb

## 2017-04-14 DIAGNOSIS — Z1211 Encounter for screening for malignant neoplasm of colon: Secondary | ICD-10-CM

## 2017-04-14 DIAGNOSIS — K219 Gastro-esophageal reflux disease without esophagitis: Secondary | ICD-10-CM

## 2017-04-14 DIAGNOSIS — K2270 Barrett's esophagus without dysplasia: Secondary | ICD-10-CM

## 2017-04-14 MED ORDER — NA SULFATE-K SULFATE-MG SULF 17.5-3.13-1.6 GM/177ML PO SOLN: ORAL | 0 refills | Status: DC

## 2017-04-14 MED ORDER — OMEPRAZOLE 40 MG PO CPDR: 40.0000 mg | DELAYED_RELEASE_CAPSULE | Freq: Two times a day (BID) | ORAL | 3 refills | Status: DC

## 2017-04-14 NOTE — Patient Instructions (Signed)
You have been scheduled for an endoscopy and colonoscopy. Please follow the written instructions given to you at your visit today. Please pick up your prep supplies at the pharmacy within the next 1-3 days. If you use inhalers (even only as needed), please bring them with you on the day of your procedure. Your physician has requested that you go to www.startemmi.com and enter the access code given to you at your visit today. This web site gives a general overview about your procedure. However, you should still follow specific instructions given to you by our office regarding your preparation for the procedure.  We have sent the following medications to your pharmacy for you to pick up at your convenience: Prilosec 40 mg twice daily  If you are age 67 or older, your body mass index should be between 23-30. Your Body mass index is 34.7 kg/m. If this is out of the aforementioned range listed, please consider follow up with your Primary Care Provider.  If you are age 61 or younger, your body mass index should be between 19-25. Your Body mass index is 34.7 kg/m. If this is out of the aformentioned range listed, please consider follow up with your Primary Care Provider.

## 2017-04-14 NOTE — Progress Notes (Signed)
   Subjective:    Patient ID: Joseph Fisher, male    DOB: 17-Jul-1966, 51 y.o.   MRN: [id number]  HPI Moshe Wenger is a 51 yo male with PMH of GERD, Barrett's, gastric ulcers, and abd bloating who is here for followup.   He was last seen in Nov 2016 and is here alone today.  He reports he is doing well. Still on omep 40 mg BIDAC.  Working well.  No heartburn, dysphagia, n/v or abd pain.  Stools are regular without diarrhea or constipation.   No rectal bleeding or melena.  Some abd bloating symptoms and flatulence symptoms.  Treated with abx (flagyl) last year x 7 days and he does not feel this was very helpful.    He is out of work on disability. He remains on narcotic pain medication.  Review of Systems As per HPI, otherwise negative  Current Medications, Allergies, Past Medical History, Past Surgical History, Family History and Social History were reviewed in [employer] record.     Objective:   Physical Exam BP 128/88   Pulse 67   Ht 5\' 6"  (1.676 m)   Wt 215 lb (97.5 kg)   BMI 34.70 kg/m  Constitutional: Well-developed and well-nourished. No distress. HEENT: Normocephalic and atraumatic. Oropharynx is clear and moist. Conjunctivae are normal.  No scleral icterus. Neck: Neck supple. Trachea midline. Cardiovascular: Normal rate, regular rhythm and intact distal pulses. No M/R/G Pulmonary/chest: Effort normal and breath sounds normal. No wheezing, rales or rhonchi. Abdominal: Soft, nontender, nondistended. Bowel sounds active throughout. There are no masses palpable. No hepatosplenomegaly. Diastasis recti Extremities: no clubbing, cyanosis, or edema Neurological: Alert and oriented to person place and time. Skin: Skin is warm and dry. Psychiatric: Normal mood and affect. Behavior is normal.      Assessment & Plan:    1. GERD with Barrett's -- well controlled reflux symptoms on BID omeprazole.  Continue current dose.  EGD recommended for surveillance as  the last exam was 3 yrs ago. The nature of the procedure, as well as the risks, benefits, and alternatives were carefully and thoroughly reviewed with the patient. Ample time for discussion and questions allowed. The patient understood, was satisfied, and agreed to proceed.   2. CRC screening -- 51 yo.  Avg risk.  Colonoscopy advised for screening.  The nature of the procedure, as well as the risks, benefits, and alternatives were carefully and thoroughly reviewed with the patient. Ample time for discussion and questions allowed. The patient understood, was satisfied, and agreed to proceed.   3. Diastasis recti -- benign, education and reassurance provided.  25 minutes spent with the patient today. Greater than 50% was spent in counseling and coordination of care with the patient

## 2017-04-15 DIAGNOSIS — K279 Peptic ulcer, site unspecified, unspecified as acute or chronic, without hemorrhage or perforation: Secondary | ICD-10-CM | POA: Insufficient documentation

## 2017-05-21 ENCOUNTER — Encounter: Payer: Self-pay | Admitting: Internal Medicine

## 2017-05-21 ENCOUNTER — Ambulatory Visit (AMBULATORY_SURGERY_CENTER): Payer: Medicare Other | Admitting: Internal Medicine

## 2017-05-21 VITALS — BP 129/98 | HR 75 | Temp 99.1°F | Resp 21 | Ht 66.0 in | Wt 215.0 lb

## 2017-05-21 DIAGNOSIS — D124 Benign neoplasm of descending colon: Secondary | ICD-10-CM | POA: Diagnosis not present

## 2017-05-21 DIAGNOSIS — D122 Benign neoplasm of ascending colon: Secondary | ICD-10-CM

## 2017-05-21 DIAGNOSIS — K2270 Barrett's esophagus without dysplasia: Secondary | ICD-10-CM

## 2017-05-21 DIAGNOSIS — Z1212 Encounter for screening for malignant neoplasm of rectum: Secondary | ICD-10-CM | POA: Diagnosis not present

## 2017-05-21 DIAGNOSIS — Z1211 Encounter for screening for malignant neoplasm of colon: Secondary | ICD-10-CM | POA: Diagnosis not present

## 2017-05-21 DIAGNOSIS — K2950 Unspecified chronic gastritis without bleeding: Secondary | ICD-10-CM | POA: Diagnosis not present

## 2017-05-21 DIAGNOSIS — Z8719 Personal history of other diseases of the digestive system: Secondary | ICD-10-CM

## 2017-05-21 MED ORDER — SODIUM CHLORIDE 0.9 % IV SOLN: 500.0000 mL | INTRAVENOUS | Status: AC

## 2017-05-21 NOTE — Progress Notes (Signed)
Report to PACU, RN, vss, BBS= Clear.  

## 2017-05-21 NOTE — Progress Notes (Signed)
Called to room to assist during endoscopic procedure.  Patient ID and intended procedure confirmed with present staff. Received instructions for my participation in the procedure from the performing physician.  

## 2017-05-21 NOTE — Op Note (Signed)
[facility] Patient Name: Joseph Fisher Procedure Date: 05/21/2017 3:09 PM MRN: [id number] Endoscopist: Jerene Bears , MD Age: 51 Referring MD:  Date of Birth: 01/05/1966 Gender: Male Account #: [id number] Procedure:                Colonoscopy Indications:              Screening for colorectal malignant neoplasm, This                            is the patient's first colonoscopy Medicines:                Monitored Anesthesia Care Procedure:                Pre-Anesthesia Assessment:                           - Prior to the procedure, a History and Physical                            was performed, and patient medications and                            allergies were reviewed. The patient's tolerance of                            previous anesthesia was also reviewed. The risks                            and benefits of the procedure and the sedation                            options and risks were discussed with the patient.                            All questions were answered, and informed consent                            was obtained. Prior Anticoagulants: The patient has                            taken no previous anticoagulant or antiplatelet                            agents. ASA Grade Assessment: II - A patient with                            mild systemic disease. After reviewing the risks                            and benefits, the patient was deemed in                            satisfactory condition to undergo the procedure.  After obtaining informed consent, the colonoscope                            was passed under direct vision. Throughout the                            procedure, the patient's blood pressure, pulse, and                            oxygen saturations were monitored continuously. The                            Colonoscope was introduced through the anus and                            advanced to the the cecum,  identified by                            appendiceal orifice and ileocecal valve. The                            colonoscopy was performed without difficulty. The                            patient tolerated the procedure well. The quality                            of the bowel preparation was good. The ileocecal                            valve, appendiceal orifice, and rectum were                            photographed. Scope In: 3:22:32 PM Scope Out: 3:35:49 PM Scope Withdrawal Time: 0 hours 11 minutes 50 seconds  Total Procedure Duration: 0 hours 13 minutes 17 seconds  Findings:                 The digital rectal exam was normal.                           Three sessile polyps were found in the ascending                            colon. The polyps were 2 to 5 mm in size. These                            polyps were removed with a cold snare. Resection                            and retrieval were complete.                           A 7 mm polyp was found in the descending colon. The  polyp was sessile. The polyp was removed with a                            cold snare. Resection and retrieval were complete.                           Multiple small and large-mouthed diverticula were                            found in the sigmoid colon and descending colon.                           External hemorrhoids were found during retroflexion                            and during endoscopy. The hemorrhoids were small. Complications:            No immediate complications. Estimated Blood Loss:     Estimated blood loss was minimal. Impression:               - Three 2 to 5 mm polyps in the ascending colon,                            removed with a cold snare. Resected and retrieved.                           - One 7 mm polyp in the descending colon, removed                            with a cold snare. Resected and retrieved.                           - Mild  diverticulosis in the sigmoid colon and in                            the descending colon.                           - Small external hemorrhoids. Recommendation:           - Patient has a contact number available for                            emergencies. The signs and symptoms of potential                            delayed complications were discussed with the                            patient. Return to normal activities tomorrow.                            Written discharge instructions were provided to the  patient.                           - Resume previous diet.                           - Continue present medications.                           - Await pathology results.                           - Repeat colonoscopy is recommended for                            surveillance. The colonoscopy date will be                            determined after pathology results from today's                            exam become available for review. Jerene Bears, MD 05/21/2017 3:49:44 PM This report has been signed electronically.

## 2017-05-21 NOTE — Patient Instructions (Signed)
YOU HAD AN ENDOSCOPIC PROCEDURE TODAY AT [facility]:   Refer to the procedure report that was given to you for any specific questions about what was found during the examination.  If the procedure report does not answer your questions, please call your gastroenterologist to clarify.  If you requested that your care partner not be given the details of your procedure findings, then the procedure report has been included in a sealed envelope for you to review at your convenience later.  YOU SHOULD EXPECT: Some feelings of bloating in the abdomen. Passage of more gas than usual.  Walking can help get rid of the air that was put into your GI tract during the procedure and reduce the bloating. If you had a lower endoscopy (such as a colonoscopy or flexible sigmoidoscopy) you may notice spotting of blood in your stool or on the toilet paper. If you underwent a bowel prep for your procedure, you may not have a normal bowel movement for a few days.  Please Note:  You might notice some irritation and congestion in your nose or some drainage.  This is from the oxygen used during your procedure.  There is no need for concern and it should clear up in a day or so.  SYMPTOMS TO REPORT IMMEDIATELY:   Following lower endoscopy (colonoscopy or flexible sigmoidoscopy):  Excessive amounts of blood in the stool  Significant tenderness or worsening of abdominal pains  Swelling of the abdomen that is new, acute  Fever of 100F or higher   Following upper endoscopy (EGD)  Vomiting of blood or coffee ground material  New chest pain or pain under the shoulder blades  Painful or persistently difficult swallowing  New shortness of breath  Fever of 100F or higher  Black, tarry-looking stools  For urgent or emergent issues, a gastroenterologist can be reached at any hour by calling [phone].   DIET:  We do recommend a small meal at first, but then you may proceed to your regular diet.  Drink  plenty of fluids but you should avoid alcoholic beverages for 24 hours.  ACTIVITY:  You should plan to take it easy for the rest of today and you should NOT DRIVE or use heavy machinery until tomorrow (because of the sedation medicines used during the test).    FOLLOW UP: Our staff will call the number listed on your records the next business day following your procedure to check on you and address any questions or concerns that you may have regarding the information given to you following your procedure. If we do not reach you, we will leave a message.  However, if you are feeling well and you are not experiencing any problems, there is no need to return our call.  We will assume that you have returned to your regular daily activities without incident.  If any biopsies were taken you will be contacted by phone or by letter within the next 1-3 weeks.  Please call us at [phone] if you have not heard about the biopsies in 3 weeks.   Polyps (handout given) Diverticulosis(handout given) Hemorrhoids (handout given) Await for biopsy results to determine next repeat Colonoscopy screening   SIGNATURES/CONFIDENTIALITY: You and/or your care partner have signed paperwork which will be entered into your electronic medical record.  These signatures attest to the fact that that the information above on your After Visit Summary has been reviewed and is understood.  Full responsibility of the confidentiality of this discharge  information lies with you and/or your care-partner. 

## 2017-05-21 NOTE — Op Note (Signed)
[facility] Patient Name: Joseph Fisher Procedure Date: 05/21/2017 3:09 PM MRN: [id number] Endoscopist: Jerene Bears , MD Age: 51 Referring MD:  Date of Birth: 04-12-1966 Gender: Male Account #: [id number] Procedure:                Upper GI endoscopy Indications:              Follow-up of Barrett's esophagus, last EGD 3 yrs ago Medicines:                Monitored Anesthesia Care Procedure:                Pre-Anesthesia Assessment:                           - Prior to the procedure, a History and Physical                            was performed, and patient medications and                            allergies were reviewed. The patient's tolerance of                            previous anesthesia was also reviewed. The risks                            and benefits of the procedure and the sedation                            options and risks were discussed with the patient.                            All questions were answered, and informed consent                            was obtained. Prior Anticoagulants: The patient has                            taken no previous anticoagulant or antiplatelet                            agents. ASA Grade Assessment: II - A patient with                            mild systemic disease. After reviewing the risks                            and benefits, the patient was deemed in                            satisfactory condition to undergo the procedure.                           After obtaining informed consent, the endoscope was  passed under direct vision. Throughout the                            procedure, the patient's blood pressure, pulse, and                            oxygen saturations were monitored continuously. The                            Endoscope was introduced through the mouth, and                            advanced to the second part of duodenum. The upper                            GI  endoscopy was accomplished without difficulty.                            The patient tolerated the procedure well. Scope In: Scope Out: Findings:                 The esophagus and gastroesophageal junction were                            examined with white light and narrow band imaging                            (NBI) from a forward view and retroflexed position.                            There were esophageal mucosal changes consistent                            with long-segment Barrett's esophagus. These                            changes involved the mucosa at the upper extent of                            the gastric folds (40 cm from the incisors)                            extending to the Z-line (32 cm from the incisors).                            Circumferential salmon-colored mucosa was present                            from 33 to 40 cm. The maximum longitudinal extent                            of these esophageal mucosal changes was 8 cm in  length (C7,M8). Mucosa was biopsied with a cold                            forceps for histology in 4 quadrants at intervals                            of 2 cm from 32 to 40 cm from the incisors. A total                            of 5 specimen bottles were sent to pathology.                           The entire examined stomach was normal.                           The examined duodenum was normal. Complications:            No immediate complications. Estimated Blood Loss:     Estimated blood loss was minimal. Impression:               - Esophageal mucosal changes consistent with                            long-segment Barrett's esophagus. Biopsied.                           - Normal stomach.                           - Normal examined duodenum. Recommendation:           - Patient has a contact number available for                            emergencies. The signs and symptoms of potential                             delayed complications were discussed with the                            patient. Return to normal activities tomorrow.                            Written discharge instructions were provided to the                            patient.                           - Resume previous diet.                           - Continue present medications.                           - Await pathology results.                           -  Repeat upper endoscopy for surveillance based on                            pathology results. Jerene Bears, MD 05/21/2017 3:47:04 PM This report has been signed electronically.

## 2017-05-22 ENCOUNTER — Telehealth: Payer: Self-pay | Admitting: *Deleted

## 2017-05-22 NOTE — Telephone Encounter (Signed)
Pt called back and said he is doing fine from his procedure yesterday

## 2017-05-22 NOTE — Telephone Encounter (Signed)
No answer for follow up call and unable to leave message. SM

## 2017-05-22 NOTE — Telephone Encounter (Signed)
Left message for follow up call will attempt to call back later this afternoon. SM

## 2017-05-26 ENCOUNTER — Encounter: Payer: Self-pay | Admitting: Internal Medicine

## 2017-08-14 ENCOUNTER — Other Ambulatory Visit: Payer: Self-pay | Admitting: Neurosurgery

## 2017-08-14 DIAGNOSIS — M5416 Radiculopathy, lumbar region: Secondary | ICD-10-CM

## 2017-08-14 DIAGNOSIS — M4802 Spinal stenosis, cervical region: Secondary | ICD-10-CM

## 2017-08-27 ENCOUNTER — Ambulatory Visit
Admission: RE | Admit: 2017-08-27 | Discharge: 2017-08-27 | Disposition: A | Discharge status: Home / Self Care | Payer: Medicare Other | Source: Ambulatory Visit | Attending: Neurosurgery | Admitting: Neurosurgery

## 2017-08-27 DIAGNOSIS — M4802 Spinal stenosis, cervical region: Secondary | ICD-10-CM

## 2017-08-27 DIAGNOSIS — I10 Essential (primary) hypertension: Secondary | ICD-10-CM | POA: Insufficient documentation

## 2017-08-27 DIAGNOSIS — G4700 Insomnia, unspecified: Secondary | ICD-10-CM | POA: Insufficient documentation

## 2017-08-27 DIAGNOSIS — M5416 Radiculopathy, lumbar region: Secondary | ICD-10-CM

## 2017-08-27 MED ORDER — MEPERIDINE HCL 100 MG/ML IJ SOLN
100.0000 mg | Freq: Once | INTRAMUSCULAR | Status: AC
  Administered 2017-08-27: 100 mg via INTRAMUSCULAR

## 2017-08-27 MED ORDER — IOPAMIDOL (ISOVUE-M 300) INJECTION 61%
10.0000 mL | Freq: Once | INTRAMUSCULAR | Status: AC | PRN
  Administered 2017-08-27: 10 mL via INTRATHECAL

## 2017-08-27 MED ORDER — ONDANSETRON HCL 4 MG/2ML IJ SOLN
4.0000 mg | Freq: Once | INTRAMUSCULAR | Status: AC
  Administered 2017-08-27: 4 mg via INTRAMUSCULAR

## 2017-08-27 MED ORDER — DIAZEPAM 5 MG PO TABS
10.0000 mg | ORAL_TABLET | Freq: Once | ORAL | Status: AC
  Administered 2017-08-27: 10 mg via ORAL

## 2017-08-27 NOTE — Discharge Instructions (Signed)

## 2017-08-29 ENCOUNTER — Other Ambulatory Visit: Payer: Self-pay | Admitting: Neurosurgery

## 2017-09-11 ENCOUNTER — Other Ambulatory Visit: Payer: Self-pay | Admitting: *Deleted

## 2017-09-11 MED ORDER — OMEPRAZOLE 40 MG PO CPDR: 40.0000 mg | DELAYED_RELEASE_CAPSULE | Freq: Two times a day (BID) | ORAL | 0 refills | Status: DC

## 2017-09-29 ENCOUNTER — Encounter (HOSPITAL_COMMUNITY): Payer: Self-pay

## 2017-09-29 ENCOUNTER — Encounter (HOSPITAL_COMMUNITY)
Admission: RE | Admit: 2017-09-29 | Discharge: 2017-09-29 | Disposition: A | Discharge status: Home / Self Care | Payer: Medicare Other | Source: Ambulatory Visit | Attending: Neurosurgery | Admitting: Neurosurgery

## 2017-09-29 ENCOUNTER — Inpatient Hospital Stay (HOSPITAL_COMMUNITY): Admission: RE | Admit: 2017-09-29 | Payer: Medicare Other | Source: Ambulatory Visit

## 2017-09-29 ENCOUNTER — Other Ambulatory Visit: Payer: Self-pay

## 2017-09-29 DIAGNOSIS — Z01818 Encounter for other preprocedural examination: Secondary | ICD-10-CM | POA: Diagnosis present

## 2017-09-29 DIAGNOSIS — Z01812 Encounter for preprocedural laboratory examination: Secondary | ICD-10-CM | POA: Insufficient documentation

## 2017-09-29 DIAGNOSIS — R001 Bradycardia, unspecified: Secondary | ICD-10-CM | POA: Diagnosis not present

## 2017-09-29 HISTORY — DX: Spinal stenosis, cervical region: M48.02

## 2017-09-29 LAB — CBC
HCT: 44 % (ref 39.0–52.0)
Hemoglobin: 15 g/dL (ref 13.0–17.0)
MCH: 31.2 pg (ref 26.0–34.0)
MCHC: 34.1 g/dL (ref 30.0–36.0)
MCV: 91.5 fL (ref 78.0–100.0)
Platelets: 152 10*3/uL (ref 150–400)
RBC: 4.81 MIL/uL (ref 4.22–5.81)
RDW: 12.8 % (ref 11.5–15.5)
WBC: 3.9 10*3/uL — ABNORMAL LOW (ref 4.0–10.5)

## 2017-09-29 LAB — BASIC METABOLIC PANEL
Anion gap: 8 (ref 5–15)
BUN: 12 mg/dL (ref 6–20)
CO2: 27 mmol/L (ref 22–32)
Calcium: 9.7 mg/dL (ref 8.9–10.3)
Chloride: 103 mmol/L (ref 101–111)
Creatinine, Ser: 0.91 mg/dL (ref 0.61–1.24)
GFR calc Af Amer: >60 mL/min (ref >60)
GFR calc non Af Amer: >60 mL/min (ref >60)
Glucose, Bld: 123 mg/dL — ABNORMAL HIGH (ref 65–99)
Potassium: 3.8 mmol/L (ref 3.5–5.1)
Sodium: 138 mmol/L (ref 135–145)

## 2017-09-29 LAB — SURGICAL PCR SCREEN
MRSA, PCR: NEGATIVE
Staphylococcus aureus: NEGATIVE

## 2017-09-29 NOTE — Pre-Procedure Instructions (Signed)
Joseph Fisher  09/29/2017      [id number] - [place], [place] - [address] AT [address] [place] [address] Phone: [phone] Fax: [phone]    Your procedure is scheduled on Friday, October 03, 2017  Report to [facility] Admitting Entrance "A" at 8:50AM   Call this number if you have problems the morning of surgery:  [phone]   Remember:  Do not eat food or drink liquids after midnight.  Take these medicines the morning of surgery with A SIP OF WATER: Omeprazole (PRILOSEC) and Pregabalin (LYRICA). If needed Acetaminophen (TYLENOL PO) for mild pain or OxyCODONE-acetaminophen (PERCOCET) for moderate to severe pain.   As of today, stop taking all Aspirins, Vitamins, Fish oils, and Herbal medications. Also stop all NSAIDS i.e. Advil, Ibuprofen, Motrin, Aleve, Anaprox, Naproxen, BC and Goody Powders.   Do not wear jewelry.  Do not wear lotions, powders, colognes, or deodorant.  Do not shave 48 hours prior to surgery.  Men may shave face and neck.  Do not bring valuables to the hospital.  [facility] is not responsible for any belongings or valuables.  Contacts, dentures or bridgework may not be worn into surgery.  Leave your suitcase in the car.  After surgery it may be brought to your room.  For patients admitted to the hospital, discharge time will be determined by your treatment team.  Patients discharged the day of surgery will not be allowed to drive home.   Special instructions:   [facility]- Preparing For Surgery  Before surgery, you can play an important role. Because skin is not sterile, your skin needs to be as free of germs as possible. You can reduce the number of germs on your skin by washing with CHG (chlorahexidine gluconate) Soap before surgery.  CHG is an antiseptic cleaner which kills germs and bonds with the skin to continue killing germs even after washing.  Please do not use  if you have an allergy to CHG or antibacterial soaps. If your skin becomes reddened/irritated stop using the CHG.  Do not shave (including legs and underarms) for at least 48 hours prior to first CHG shower. It is OK to shave your face.  Please follow these instructions carefully.   1. Shower the NIGHT BEFORE SURGERY and the MORNING OF SURGERY with CHG.   2. If you chose to wash your hair, wash your hair first as usual with your normal shampoo.  3. After you shampoo, rinse your hair and body thoroughly to remove the shampoo.  4. Use CHG as you would any other liquid soap. You can apply CHG directly to the skin and wash gently with a scrungie or a clean washcloth.   5. Apply the CHG Soap to your body ONLY FROM THE NECK DOWN.  Do not use on open wounds or open sores. Avoid contact with your eyes, ears, mouth and genitals (private parts). Wash Face and genitals (private parts)  with your normal soap.  6. Wash thoroughly, paying special attention to the area where your surgery will be performed.  7. Thoroughly rinse your body with warm water from the neck down.  8. DO NOT shower/wash with your normal soap after using and rinsing off the CHG Soap.  9. Pat yourself dry with a CLEAN TOWEL.  10. Wear CLEAN PAJAMAS to bed the night before surgery, wear comfortable clothes the morning of surgery  11. Place CLEAN SHEETS on your bed  the night of your first shower and DO NOT SLEEP WITH PETS.  Day of Surgery: Do not apply any deodorants/lotions. Please wear clean clothes to the hospital/surgery center.    Please read over the following fact sheets that you were given. Pain Booklet, Coughing and Deep Breathing, MRSA Information and Surgical Site Infection Prevention

## 2017-09-29 NOTE — Progress Notes (Signed)
PCP - Dr. Loreen Freud- [address]   Cardiologist - Denies  Chest x-ray - Denies  EKG - 09/29/17  Stress Test - 04/28/02 (E)  ECHO - Denies  Cardiac Cath - Denies  Sleep Study - No CPAP - None   Pt denies having chest pain, sob, or fever at this time. All instructions explained to the pt, with a verbal understanding of the material. Pt agrees to go over the instructions while at home for a better understanding. The opportunity to ask questions was provided.

## 2017-10-03 ENCOUNTER — Inpatient Hospital Stay (HOSPITAL_COMMUNITY)
Admission: RE | Admit: 2017-10-03 | Discharge: 2017-10-04 | DRG: 473 | Disposition: A | Discharge status: Home / Self Care | Payer: Medicare Other | Source: Ambulatory Visit | Attending: Neurosurgery | Admitting: Neurosurgery

## 2017-10-03 ENCOUNTER — Encounter (HOSPITAL_COMMUNITY)
Admission: RE | Disposition: A | Discharge status: Home / Self Care | Payer: Self-pay | Source: Ambulatory Visit | Attending: Neurosurgery

## 2017-10-03 ENCOUNTER — Inpatient Hospital Stay (HOSPITAL_COMMUNITY): Payer: Medicare Other

## 2017-10-03 ENCOUNTER — Inpatient Hospital Stay (HOSPITAL_COMMUNITY): Payer: Medicare Other | Admitting: Anesthesiology

## 2017-10-03 ENCOUNTER — Encounter (HOSPITAL_COMMUNITY): Payer: Self-pay

## 2017-10-03 DIAGNOSIS — G2581 Restless legs syndrome: Secondary | ICD-10-CM | POA: Diagnosis present

## 2017-10-03 DIAGNOSIS — Z8711 Personal history of peptic ulcer disease: Secondary | ICD-10-CM | POA: Diagnosis not present

## 2017-10-03 DIAGNOSIS — M2578 Osteophyte, vertebrae: Secondary | ICD-10-CM | POA: Diagnosis present

## 2017-10-03 DIAGNOSIS — Z79899 Other long term (current) drug therapy: Secondary | ICD-10-CM | POA: Diagnosis not present

## 2017-10-03 DIAGNOSIS — K219 Gastro-esophageal reflux disease without esophagitis: Secondary | ICD-10-CM | POA: Diagnosis present

## 2017-10-03 DIAGNOSIS — G8929 Other chronic pain: Secondary | ICD-10-CM | POA: Diagnosis present

## 2017-10-03 DIAGNOSIS — R110 Nausea: Secondary | ICD-10-CM | POA: Diagnosis not present

## 2017-10-03 DIAGNOSIS — M4802 Spinal stenosis, cervical region: Secondary | ICD-10-CM | POA: Diagnosis present

## 2017-10-03 DIAGNOSIS — G4700 Insomnia, unspecified: Secondary | ICD-10-CM | POA: Diagnosis present

## 2017-10-03 DIAGNOSIS — Z8249 Family history of ischemic heart disease and other diseases of the circulatory system: Secondary | ICD-10-CM

## 2017-10-03 DIAGNOSIS — K2270 Barrett's esophagus without dysplasia: Secondary | ICD-10-CM | POA: Diagnosis present

## 2017-10-03 DIAGNOSIS — I10 Essential (primary) hypertension: Secondary | ICD-10-CM | POA: Diagnosis present

## 2017-10-03 DIAGNOSIS — M4722 Other spondylosis with radiculopathy, cervical region: Secondary | ICD-10-CM | POA: Diagnosis present

## 2017-10-03 DIAGNOSIS — Z419 Encounter for procedure for purposes other than remedying health state, unspecified: Secondary | ICD-10-CM

## 2017-10-03 HISTORY — PX: ANTERIOR CERVICAL DECOMP/DISCECTOMY FUSION: SHX1161

## 2017-10-03 SURGERY — ANTERIOR CERVICAL DECOMPRESSION/DISCECTOMY FUSION 3 LEVELS
Anesthesia: General

## 2017-10-03 MED ORDER — NEBIVOLOL HCL 5 MG PO TABS
5.0000 mg | ORAL_TABLET | Freq: Every evening | ORAL | Status: DC
  Administered 2017-10-03: 5 mg via ORAL
  Filled 2017-10-03: qty 1

## 2017-10-03 MED ORDER — METOPROLOL TARTRATE 5 MG/5ML IV SOLN
5.0000 mg | Freq: Four times a day (QID) | INTRAVENOUS | Status: DC
  Filled 2017-10-03 (×2): qty 5

## 2017-10-03 MED ORDER — AZELASTINE HCL 0.1 % NA SOLN: 2.0000 | Freq: Every day | NASAL | Status: DC | PRN

## 2017-10-03 MED ORDER — OXYCODONE HCL 5 MG PO TABS
5.0000 mg | ORAL_TABLET | Freq: Three times a day (TID) | ORAL | Status: DC
  Administered 2017-10-03: 5 mg via ORAL
  Filled 2017-10-03: qty 1

## 2017-10-03 MED ORDER — FENTANYL CITRATE (PF) 100 MCG/2ML IJ SOLN
INTRAMUSCULAR | Status: AC
  Administered 2017-10-03: 25 ug via INTRAVENOUS
  Filled 2017-10-03: qty 2

## 2017-10-03 MED ORDER — PANTOPRAZOLE SODIUM 40 MG PO TBEC
40.0000 mg | DELAYED_RELEASE_TABLET | Freq: Every day | ORAL | Status: DC
  Administered 2017-10-03: 40 mg via ORAL
  Filled 2017-10-03: qty 1

## 2017-10-03 MED ORDER — PREGABALIN 75 MG PO CAPS
150.0000 mg | ORAL_CAPSULE | Freq: Two times a day (BID) | ORAL | Status: DC
  Administered 2017-10-03: 150 mg via ORAL
  Filled 2017-10-03: qty 2

## 2017-10-03 MED ORDER — 0.9 % SODIUM CHLORIDE (POUR BTL) OPTIME
TOPICAL | Status: DC | PRN
  Administered 2017-10-03: 1000 mL

## 2017-10-03 MED ORDER — MENTHOL 3 MG MT LOZG: 1.0000 | LOZENGE | OROMUCOSAL | Status: DC | PRN

## 2017-10-03 MED ORDER — BACITRACIN 50000 UNITS IM SOLR
INTRAMUSCULAR | Status: DC | PRN
  Administered 2017-10-03: 12:00:00

## 2017-10-03 MED ORDER — ACETAMINOPHEN 325 MG PO TABS
650.0000 mg | ORAL_TABLET | ORAL | Status: DC | PRN
  Administered 2017-10-03 – 2017-10-04 (×2): 650 mg via ORAL
  Filled 2017-10-03 (×2): qty 2

## 2017-10-03 MED ORDER — ROCURONIUM BROMIDE 10 MG/ML (PF) SYRINGE
PREFILLED_SYRINGE | INTRAVENOUS | Status: AC
  Filled 2017-10-03: qty 5

## 2017-10-03 MED ORDER — ONDANSETRON HCL 4 MG/2ML IJ SOLN
INTRAMUSCULAR | Status: DC | PRN
  Administered 2017-10-03: 4 mg via INTRAVENOUS

## 2017-10-03 MED ORDER — METOPROLOL TARTRATE 5 MG/5ML IV SOLN
INTRAVENOUS | Status: DC | PRN
  Administered 2017-10-03: 2 mg via INTRAVENOUS

## 2017-10-03 MED ORDER — IRBESARTAN 300 MG PO TABS: 300.0000 mg | ORAL_TABLET | Freq: Every day | ORAL | Status: DC

## 2017-10-03 MED ORDER — ONDANSETRON HCL 4 MG/2ML IJ SOLN
INTRAMUSCULAR | Status: AC
  Filled 2017-10-03: qty 2

## 2017-10-03 MED ORDER — CYCLOBENZAPRINE HCL 10 MG PO TABS
ORAL_TABLET | ORAL | Status: AC
  Filled 2017-10-03: qty 1

## 2017-10-03 MED ORDER — PROMETHAZINE HCL 25 MG/ML IJ SOLN: 6.2500 mg | INTRAMUSCULAR | Status: DC | PRN

## 2017-10-03 MED ORDER — FENTANYL CITRATE (PF) 250 MCG/5ML IJ SOLN
INTRAMUSCULAR | Status: AC
Start: 2017-10-03 — End: ?
  Filled 2017-10-03: qty 5

## 2017-10-03 MED ORDER — SUGAMMADEX SODIUM 200 MG/2ML IV SOLN
INTRAVENOUS | Status: DC | PRN
  Administered 2017-10-03: 400 mg via INTRAVENOUS

## 2017-10-03 MED ORDER — HYDROCODONE-ACETAMINOPHEN 10-325 MG PO TABS
2.0000 | ORAL_TABLET | ORAL | Status: DC | PRN
  Filled 2017-10-03: qty 2

## 2017-10-03 MED ORDER — THROMBIN (RECOMBINANT) 20000 UNITS EX SOLR
CUTANEOUS | Status: AC
  Filled 2017-10-03: qty 20000

## 2017-10-03 MED ORDER — IRBESARTAN 300 MG PO TABS
300.0000 mg | ORAL_TABLET | Freq: Every day | ORAL | Status: DC
  Administered 2017-10-03: 300 mg via ORAL
  Filled 2017-10-03 (×2): qty 1

## 2017-10-03 MED ORDER — CEFAZOLIN SODIUM-DEXTROSE 1-4 GM/50ML-% IV SOLN
1.0000 g | Freq: Three times a day (TID) | INTRAVENOUS | Status: AC
  Administered 2017-10-03 – 2017-10-04 (×2): 1 g via INTRAVENOUS
  Filled 2017-10-03 (×2): qty 50

## 2017-10-03 MED ORDER — OXYCODONE-ACETAMINOPHEN 5-325 MG PO TABS
1.0000 | ORAL_TABLET | Freq: Three times a day (TID) | ORAL | Status: DC
  Administered 2017-10-03: 1 via ORAL
  Filled 2017-10-03: qty 1

## 2017-10-03 MED ORDER — FENTANYL CITRATE (PF) 100 MCG/2ML IJ SOLN
INTRAMUSCULAR | Status: DC | PRN
  Administered 2017-10-03: 50 ug via INTRAVENOUS
  Administered 2017-10-03: 25 ug via INTRAVENOUS
  Administered 2017-10-03 (×2): 50 ug via INTRAVENOUS
  Administered 2017-10-03: 100 ug via INTRAVENOUS
  Administered 2017-10-03: 150 ug via INTRAVENOUS
  Administered 2017-10-03: 25 ug via INTRAVENOUS

## 2017-10-03 MED ORDER — PROPOFOL 10 MG/ML IV BOLUS
INTRAVENOUS | Status: DC | PRN
  Administered 2017-10-03: 150 mg via INTRAVENOUS

## 2017-10-03 MED ORDER — ONDANSETRON HCL 4 MG/2ML IJ SOLN
4.0000 mg | Freq: Four times a day (QID) | INTRAMUSCULAR | Status: DC | PRN
  Administered 2017-10-03: 4 mg via INTRAVENOUS
  Filled 2017-10-03: qty 2

## 2017-10-03 MED ORDER — VALSARTAN-HYDROCHLOROTHIAZIDE 320-12.5 MG PO TABS: 1.0000 | ORAL_TABLET | Freq: Every evening | ORAL | Status: DC

## 2017-10-03 MED ORDER — HYDROMORPHONE HCL 1 MG/ML IJ SOLN
1.0000 mg | INTRAMUSCULAR | Status: DC | PRN
  Administered 2017-10-03: 1 mg via INTRAVENOUS
  Filled 2017-10-03 (×2): qty 1

## 2017-10-03 MED ORDER — PHENOL 1.4 % MT LIQD
1.0000 | OROMUCOSAL | Status: DC | PRN
  Administered 2017-10-03: 1 via OROMUCOSAL
  Filled 2017-10-03: qty 177

## 2017-10-03 MED ORDER — HYDROCHLOROTHIAZIDE 12.5 MG PO CAPS: 12.5000 mg | ORAL_CAPSULE | Freq: Every day | ORAL | Status: DC

## 2017-10-03 MED ORDER — CHLORHEXIDINE GLUCONATE CLOTH 2 % EX PADS: 6.0000 | MEDICATED_PAD | Freq: Once | CUTANEOUS | Status: DC

## 2017-10-03 MED ORDER — HYDROCODONE-ACETAMINOPHEN 5-325 MG PO TABS: 1.0000 | ORAL_TABLET | ORAL | Status: DC | PRN

## 2017-10-03 MED ORDER — SUGAMMADEX SODIUM 200 MG/2ML IV SOLN
INTRAVENOUS | Status: AC
  Filled 2017-10-03: qty 2

## 2017-10-03 MED ORDER — DEXAMETHASONE SODIUM PHOSPHATE 10 MG/ML IJ SOLN
10.0000 mg | INTRAMUSCULAR | Status: AC
  Administered 2017-10-03: 10 mg via INTRAVENOUS
  Filled 2017-10-03: qty 1

## 2017-10-03 MED ORDER — THROMBIN (RECOMBINANT) 5000 UNITS EX SOLR
CUTANEOUS | Status: DC | PRN
  Administered 2017-10-03: 12:00:00 via TOPICAL

## 2017-10-03 MED ORDER — THROMBIN (RECOMBINANT) 5000 UNITS EX SOLR
CUTANEOUS | Status: AC
  Filled 2017-10-03: qty 5000

## 2017-10-03 MED ORDER — SODIUM CHLORIDE 0.9 % IV SOLN: 250.0000 mL | INTRAVENOUS | Status: DC

## 2017-10-03 MED ORDER — CYCLOBENZAPRINE HCL 10 MG PO TABS
10.0000 mg | ORAL_TABLET | Freq: Three times a day (TID) | ORAL | Status: DC | PRN
  Administered 2017-10-03: 10 mg via ORAL

## 2017-10-03 MED ORDER — DIAZEPAM 5 MG PO TABS
5.0000 mg | ORAL_TABLET | Freq: Four times a day (QID) | ORAL | Status: DC | PRN
  Administered 2017-10-03: 5 mg via ORAL
  Filled 2017-10-03: qty 1

## 2017-10-03 MED ORDER — CEFAZOLIN SODIUM-DEXTROSE 2-4 GM/100ML-% IV SOLN
2.0000 g | INTRAVENOUS | Status: AC
  Administered 2017-10-03: 2 g via INTRAVENOUS
  Filled 2017-10-03: qty 100

## 2017-10-03 MED ORDER — HYDROCHLOROTHIAZIDE 12.5 MG PO CAPS
12.5000 mg | ORAL_CAPSULE | Freq: Every day | ORAL | Status: DC
  Administered 2017-10-03: 12.5 mg via ORAL
  Filled 2017-10-03: qty 1

## 2017-10-03 MED ORDER — METOPROLOL TARTRATE 5 MG/5ML IV SOLN
5.0000 mg | Freq: Four times a day (QID) | INTRAVENOUS | Status: DC | PRN
  Administered 2017-10-03 – 2017-10-04 (×2): 5 mg via INTRAVENOUS
  Filled 2017-10-03 (×3): qty 5

## 2017-10-03 MED ORDER — LIDOCAINE 2% (20 MG/ML) 5 ML SYRINGE
INTRAMUSCULAR | Status: AC
  Filled 2017-10-03: qty 5

## 2017-10-03 MED ORDER — FENTANYL CITRATE (PF) 250 MCG/5ML IJ SOLN
INTRAMUSCULAR | Status: AC
  Filled 2017-10-03: qty 5

## 2017-10-03 MED ORDER — PROPOFOL 10 MG/ML IV BOLUS
INTRAVENOUS | Status: AC
  Filled 2017-10-03: qty 20

## 2017-10-03 MED ORDER — HYDROXYZINE HCL 50 MG/ML IM SOLN
50.0000 mg | INTRAMUSCULAR | Status: DC | PRN
  Administered 2017-10-03: 50 mg via INTRAMUSCULAR
  Filled 2017-10-03: qty 1

## 2017-10-03 MED ORDER — SODIUM CHLORIDE 0.9% FLUSH: 3.0000 mL | Freq: Two times a day (BID) | INTRAVENOUS | Status: DC

## 2017-10-03 MED ORDER — FENTANYL CITRATE (PF) 100 MCG/2ML IJ SOLN
25.0000 ug | INTRAMUSCULAR | Status: DC | PRN
  Administered 2017-10-03: 25 ug via INTRAVENOUS
  Administered 2017-10-03: 50 ug via INTRAVENOUS
  Administered 2017-10-03: 25 ug via INTRAVENOUS

## 2017-10-03 MED ORDER — MIDAZOLAM HCL 2 MG/2ML IJ SOLN
INTRAMUSCULAR | Status: AC
  Filled 2017-10-03: qty 2

## 2017-10-03 MED ORDER — ONDANSETRON HCL 4 MG PO TABS: 4.0000 mg | ORAL_TABLET | Freq: Four times a day (QID) | ORAL | Status: DC | PRN

## 2017-10-03 MED ORDER — MIDAZOLAM HCL 5 MG/5ML IJ SOLN
INTRAMUSCULAR | Status: DC | PRN
  Administered 2017-10-03: 2 mg via INTRAVENOUS

## 2017-10-03 MED ORDER — SODIUM CHLORIDE 0.9% FLUSH: 3.0000 mL | INTRAVENOUS | Status: DC | PRN

## 2017-10-03 MED ORDER — ACETAMINOPHEN 650 MG RE SUPP: 650.0000 mg | RECTAL | Status: DC | PRN

## 2017-10-03 MED ORDER — THROMBIN (RECOMBINANT) 20000 UNITS EX SOLR
CUTANEOUS | Status: DC | PRN
  Administered 2017-10-03: 12:00:00 via TOPICAL

## 2017-10-03 MED ORDER — LIDOCAINE HCL (CARDIAC) 20 MG/ML IV SOLN
INTRAVENOUS | Status: DC | PRN
Start: 2017-10-03 — End: 2017-10-03
  Administered 2017-10-03: 100 mg via INTRAVENOUS

## 2017-10-03 MED ORDER — OXYCODONE-ACETAMINOPHEN 10-325 MG PO TABS: 1.0000 | ORAL_TABLET | Freq: Three times a day (TID) | ORAL | Status: DC

## 2017-10-03 MED ORDER — NEBIVOLOL HCL 5 MG PO TABS: 5.0000 mg | ORAL_TABLET | Freq: Every evening | ORAL | Status: DC

## 2017-10-03 MED ORDER — ROCURONIUM BROMIDE 100 MG/10ML IV SOLN
INTRAVENOUS | Status: DC | PRN
  Administered 2017-10-03 (×2): 50 mg via INTRAVENOUS
  Administered 2017-10-03: 30 mg via INTRAVENOUS

## 2017-10-03 MED ORDER — HYDRALAZINE HCL 20 MG/ML IJ SOLN
10.0000 mg | Freq: Four times a day (QID) | INTRAMUSCULAR | Status: DC | PRN
  Administered 2017-10-03 – 2017-10-04 (×2): 10 mg via INTRAVENOUS
  Filled 2017-10-03 (×2): qty 1

## 2017-10-03 MED ORDER — LACTATED RINGERS IV SOLN
INTRAVENOUS | Status: DC
  Administered 2017-10-03 (×3): via INTRAVENOUS

## 2017-10-03 SURGICAL SUPPLY — 56 items
ADH SKN CLS APL DERMABOND .7 (GAUZE/BANDAGES/DRESSINGS) ×1
APL SKNCLS STERI-STRIP NONHPOA (GAUZE/BANDAGES/DRESSINGS) ×1
BAG DECANTER FOR FLEXI CONT (MISCELLANEOUS) ×3 IMPLANT
BENZOIN TINCTURE PRP APPL 2/3 (GAUZE/BANDAGES/DRESSINGS) ×3 IMPLANT
BIT DRILL 13 (BIT) ×2 IMPLANT
BIT DRILL 13MM (BIT) ×1
BUR MATCHSTICK NEURO 3.0 LAGG (BURR) ×3 IMPLANT
CAGE PEEK 6X14X11 (Cage) ×9 IMPLANT
CAGE SPNL 11X14X6XRADOPQ (Cage) ×2 IMPLANT
CANISTER SUCT 3000ML PPV (MISCELLANEOUS) ×3 IMPLANT
CARTRIDGE OIL MAESTRO DRILL (MISCELLANEOUS) ×1 IMPLANT
CLOSURE WOUND 1/2 X4 (GAUZE/BANDAGES/DRESSINGS) ×1
DERMABOND ADVANCED (GAUZE/BANDAGES/DRESSINGS) ×2
DERMABOND ADVANCED .7 DNX12 (GAUZE/BANDAGES/DRESSINGS) ×1 IMPLANT
DIFFUSER DRILL AIR PNEUMATIC (MISCELLANEOUS) ×3 IMPLANT
DRAPE C-ARM 42X72 X-RAY (DRAPES) ×6 IMPLANT
DRAPE LAPAROTOMY 100X72 PEDS (DRAPES) ×3 IMPLANT
DRAPE MICROSCOPE LEICA (MISCELLANEOUS) ×3 IMPLANT
DRAPE POUCH INSTRU U-SHP 10X18 (DRAPES) ×3 IMPLANT
DRSG OPSITE POSTOP 4X6 (GAUZE/BANDAGES/DRESSINGS) ×3 IMPLANT
DURAPREP 6ML APPLICATOR 50/CS (WOUND CARE) ×3 IMPLANT
ELECT COATED BLADE 2.86 ST (ELECTRODE) ×3 IMPLANT
ELECT REM PT RETURN 9FT ADLT (ELECTROSURGICAL) ×3
ELECTRODE REM PT RTRN 9FT ADLT (ELECTROSURGICAL) ×1 IMPLANT
GAUZE SPONGE 4X4 12PLY STRL (GAUZE/BANDAGES/DRESSINGS) ×3 IMPLANT
GAUZE SPONGE 4X4 16PLY XRAY LF (GAUZE/BANDAGES/DRESSINGS) IMPLANT
GLOVE ECLIPSE 9.0 STRL (GLOVE) ×3 IMPLANT
GOWN STRL REUS W/ TWL LRG LVL3 (GOWN DISPOSABLE) ×2 IMPLANT
GOWN STRL REUS W/ TWL XL LVL3 (GOWN DISPOSABLE) ×1 IMPLANT
GOWN STRL REUS W/TWL 2XL LVL3 (GOWN DISPOSABLE) ×6 IMPLANT
GOWN STRL REUS W/TWL LRG LVL3 (GOWN DISPOSABLE) ×6
GOWN STRL REUS W/TWL XL LVL3 (GOWN DISPOSABLE) ×3
HALTER HD/CHIN CERV TRACTION D (MISCELLANEOUS) ×3 IMPLANT
KIT BASIN OR (CUSTOM PROCEDURE TRAY) ×3 IMPLANT
KIT ROOM TURNOVER OR (KITS) ×3 IMPLANT
NEEDLE SPNL 20GX3.5 QUINCKE YW (NEEDLE) ×3 IMPLANT
NS IRRIG 1000ML POUR BTL (IV SOLUTION) ×3 IMPLANT
OIL CARTRIDGE MAESTRO DRILL (MISCELLANEOUS) ×3
PACK LAMINECTOMY NEURO (CUSTOM PROCEDURE TRAY) ×3 IMPLANT
PAD ARMBOARD 7.5X6 YLW CONV (MISCELLANEOUS) ×9 IMPLANT
PLATE 3 57.5XLCK NS SPNE CVD (Plate) ×1 IMPLANT
PLATE 3 ATLANTIS TRANS (Plate) ×3 IMPLANT
RUBBERBAND STERILE (MISCELLANEOUS) ×6 IMPLANT
SCREW ST FIX 4 ATL 3120213 (Screw) ×24 IMPLANT
SPACER SPNL 11X14X6XPEEK CVD (Cage) ×1 IMPLANT
SPCR SPNL 11X14X6XPEEK CVD (Cage) ×1 IMPLANT
SPONGE INTESTINAL PEANUT (DISPOSABLE) ×3 IMPLANT
SPONGE SURGIFOAM ABS GEL 100 (HEMOSTASIS) ×3 IMPLANT
STRIP CLOSURE SKIN 1/2X4 (GAUZE/BANDAGES/DRESSINGS) ×2 IMPLANT
SUT VIC AB 3-0 SH 8-18 (SUTURE) ×3 IMPLANT
SUT VIC AB 4-0 RB1 18 (SUTURE) ×3 IMPLANT
TAPE CLOTH 4X10 WHT NS (GAUZE/BANDAGES/DRESSINGS) ×3 IMPLANT
TOWEL GREEN STERILE (TOWEL DISPOSABLE) ×3 IMPLANT
TOWEL GREEN STERILE FF (TOWEL DISPOSABLE) ×3 IMPLANT
TRAP SPECIMEN MUCOUS 40CC (MISCELLANEOUS) ×3 IMPLANT
WATER STERILE IRR 1000ML POUR (IV SOLUTION) ×3 IMPLANT

## 2017-10-03 NOTE — Brief Op Note (Signed)
10/03/2017  1:21 PM  PATIENT:  Joseph Fisher  51 y.o. male  PRE-OPERATIVE DIAGNOSIS:  Stenosis  POST-OPERATIVE DIAGNOSIS:  stenosis  PROCEDURE:  Procedure(s): ACDF - C4-C5 - C5-C6 - C6-C7 (N/A)  SURGEON:  Surgeon(s) and Role:    * Earnie Larsson, MD - Primary    * Kary Kos, MD - Assisting  PHYSICIAN ASSISTANT:   ASSISTANTS:    ANESTHESIA:   general  EBL:  200 mL   BLOOD ADMINISTERED:none  DRAINS: none   LOCAL MEDICATIONS USED:  NONE  SPECIMEN:  No Specimen  DISPOSITION OF SPECIMEN:  N/A  COUNTS:  YES  TOURNIQUET:  * No tourniquets in log *  DICTATION: .Dragon Dictation  PLAN OF CARE: Admit to inpatient   PATIENT DISPOSITION:  PACU - hemodynamically stable.   Delay start of Pharmacological VTE agent (>24hrs) due to surgical blood loss or risk of bleeding: yes

## 2017-10-03 NOTE — Anesthesia Procedure Notes (Signed)
Procedure Name: Intubation Date/Time: 10/03/2017 11:27 AM Performed by: Catalina Gravel, MD Pre-anesthesia Checklist: Patient identified, Emergency Drugs available, Suction available and Patient being monitored Patient Re-evaluated:Patient Re-evaluated prior to induction Oxygen Delivery Method: Circle System Utilized Preoxygenation: Pre-oxygenation with 100% oxygen Induction Type: IV induction Ventilation: Mask ventilation without difficulty Laryngoscope Size: Glidescope Grade View: Grade I Tube type: Oral Tube size: 7.5 mm Number of attempts: 1 Airway Equipment and Method: Stylet and Oral airway Placement Confirmation: ETT inserted through vocal cords under direct vision,  positive ETCO2 and breath sounds checked- equal and bilateral Secured at: 22 cm Tube secured with: Tape Dental Injury: Teeth and Oropharynx as per pre-operative assessment  Comments: Intubation per Vickii Penna, SRNA

## 2017-10-03 NOTE — Transfer of Care (Signed)
Immediate Anesthesia Transfer of Care Note  Patient: Joseph Fisher  Procedure(s) Performed: ACDF - C4-C5 - C5-C6 - C6-C7 (N/A )  Patient Location: PACU  Anesthesia Type:General  Level of Consciousness: awake, alert  and confused  Airway & Oxygen Therapy: Patient connected to nasal cannula oxygen  Post-op Assessment: Post -op Vital signs reviewed and stable  Post vital signs: stable  Last Vitals:  Vitals:   10/03/17 0858  BP: (!) 150/96  Pulse: (!) 59  Resp: 20  Temp: 36.7 C  SpO2: 97%    Last Pain:  Vitals:   10/03/17 0945  TempSrc:   PainSc: 4          Complications: No apparent anesthesia complications

## 2017-10-03 NOTE — Op Note (Signed)
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  Asst. Surgeon: Saintclair Halsted  Anesthesia: General  Indication: 51 year old male with progressive neck pain and upper extremity symptoms failing conservative management.  Workup demonstrates evidence of marked cervical spondylosis with stenosis.  Patient is failed conservative management presents now for 3 level anterior cervical decompression and fusion in hopes of improving his symptoms.  Operative note: After induction of anesthesia, patient position supine with his neck slightly extended and held for support retraction.  Patient's anterior cervical region prepped draped sterilely.  Incision made overlying C5-6.  Dissection performed on the right.  Retractor placed.  Fluoroscopy used.  Levels confirmed.  Disc spaces at C4-5, C5-6 and C6-7 were then incised.  Discectomies were performed with various instruments down to the level of the posterior annulus.  Microscope was then brought to the field and used throughout the remainder of the discectomy.  Remaining aspects of annulus osteophytes were removed using a high-speed drill down to the level of the posterior longitudinal ligament.  Posterior logical is not elevated and resected.  Underlying thecal sac was identified.  A wide central decompression was then performed indicating the bodies of C4 and C5.  Decompression then proceeded into each neural foramen.  Wide anterior foraminotomies were performed on a course exiting C5 nerve roots bilaterally.  At this point a very thorough decompression had been achieved.  There was no evidence of injury to the thecal sac or nerve  roots.  Procedure was then repeated at C5-6 and C6-7 again without complication.  Wound is then irrigated and wax solution.  [employer] anatomic peek cages were packed with locally harvested autograft.  Each cage was then impacted into place at all 3 levels.  All cages were recessed slightly from the anterior cortical margin.  Medtronic Atlantis translational anterior cervical plate was then placed over the C4-C7 level.  This and attached under fluoroscopic guidance using 13 mm fixed angle screws to each L4 levels.  All screws are given final tightening found to be solidly within the bone.  Locking screws were engaged at all 4 levels.  Final images reveal good position of the cages and hardware at the proper operative level with normal alignment of the spine.  Wound is irrigated one final time.  Hemostasis was assured with the bipolar cautery.  Wounds and closed in layers with Vicryl sutures.  Steri-Strips and sterile dressing were applied.  No apparent complications.  Patient tolerated the procedure well and he returns to the recovery room postop.

## 2017-10-03 NOTE — Anesthesia Preprocedure Evaluation (Signed)
Anesthesia Evaluation  Patient identified by MRN, date of birth, ID band Patient awake    Reviewed: Allergy & Precautions, NPO status , Patient's Chart, lab work & pertinent test results, reviewed documented beta blocker date and time   Airway Mallampati: II  TM Distance: <3 FB Neck ROM: Limited    Dental  (+) Teeth Intact, Dental Advisory Given   Pulmonary neg pulmonary ROS,    Pulmonary exam normal breath sounds clear to auscultation       Cardiovascular hypertension, Pt. on medications and Pt. on home beta blockers Normal cardiovascular exam Rhythm:Regular Rate:Normal     Neuro/Psych  Headaches, BUE weakness negative psych ROS   GI/Hepatic Neg liver ROS, PUD, GERD  Medicated,  Endo/Other  Obesity   Renal/GU negative Renal ROS     Musculoskeletal negative musculoskeletal ROS (+)   Abdominal   Peds  Hematology negative hematology ROS (+)   Anesthesia Other Findings Day of surgery medications reviewed with the patient.  Reproductive/Obstetrics                             Anesthesia Physical Anesthesia Plan  ASA: II  Anesthesia Plan: General   Post-op Pain Management:    Induction: Intravenous  PONV Risk Score and Plan: 3 and Midazolam, Dexamethasone and Ondansetron  Airway Management Planned: Oral ETT and Video Laryngoscope Planned  Additional Equipment:   Intra-op Plan:   Post-operative Plan: Extubation in OR  Informed Consent: I have reviewed the patients History and Physical, chart, labs and discussed the procedure including the risks, benefits and alternatives for the proposed anesthesia with the patient or authorized representative who has indicated his/her understanding and acceptance.   Dental advisory given  Plan Discussed with: CRNA  Anesthesia Plan Comments: (Risks/benefits of general anesthesia discussed with patient including risk of damage to teeth, lips,  gum, and tongue, nausea/vomiting, allergic reactions to medications, and the possibility of heart attack, stroke and death.  All patient questions answered.  Patient wishes to proceed.)        Anesthesia Quick Evaluation

## 2017-10-03 NOTE — H&P (Signed)
Joseph Fisher is an 51 y.o. male.   Chief Complaint: Neck pain and bilateral upper extremity numbness HPI: 51 year old male with chronic and progressive neck pain with bilateral upper extremity numbness tingling and pain.  Workup demonstrates evidence of multilevel spondylosis with stenosis.  Patient is failed conservative management and presents now for 3 level anterior cervical decompression and fusion in hopes of improving his symptoms.  Past Medical History:  Diagnosis Date  . Anemia, unspecified 07/14/2014  . Barrett's esophagus   . Blood transfusion without reported diagnosis 05/2014  . Cervical stenosis of spine   . Chronic back pain   . Diverticulosis   . Gastric ulcer   . GERD (gastroesophageal reflux disease)   . GI bleed   . Hemorrhagic shock ([place])   . Hypertension   . Hypoxemia 06/20/2014  . Insomnia   . Peptic ulcer   . Pneumonia   . RLS (restless legs syndrome) 06/20/2014  . Snoring 06/20/2014  . Unspecified deficiency anemia 07/14/2014    Past Surgical History:  Procedure Laterality Date  . BACK SURGERY  2013  . ESOPHAGOGASTRODUODENOSCOPY N/A 06/04/2014   Procedure: ESOPHAGOGASTRODUODENOSCOPY (EGD);  Surgeon: Jerene Bears, MD;  Location: [facility];  Service: Endoscopy;  Laterality: N/A;    Family History  Problem Relation Age of Onset  . Hypertension Mother   . Hypertension Father    Social History:  reports that  has never smoked. he has never used smokeless tobacco. He reports that he does not drink alcohol or use drugs.  Allergies: No Known Allergies  Facility-Administered Medications Prior to Admission  Medication Dose Route Frequency Provider Last Rate Last Dose  . 0.9 %  sodium chloride infusion  500 mL Intravenous Continuous Pyrtle, Lajuan Lines, MD       Medications Prior to Admission  Medication Sig Dispense Refill  . Acetaminophen (TYLENOL PO) Take 3-4 tablets by mouth every 6 (six) hours as needed (depends on pain if takes 3-4 tablets).     Marland Kitchen  azelastine (ASTELIN) 0.1 % nasal spray Place 2 sprays into both nostrils daily as needed for rhinitis or allergies.   4  . nebivolol (BYSTOLIC) 5 MG tablet Take 5 mg by mouth every evening.     Marland Kitchen omeprazole (PRILOSEC) 40 MG capsule Take 1 capsule (40 mg total) 2 (two) times daily before a meal by mouth. (Patient taking differently: Take 40 mg by mouth daily. ) 180 capsule 0  . oxyCODONE-acetaminophen (PERCOCET) 10-325 MG tablet Take 1 tablet by mouth 3 (three) times daily. From pain management.   0  . pregabalin (LYRICA) 150 MG capsule Take 150 mg by mouth 2 (two) times daily.    . valsartan-hydrochlorothiazide (DIOVAN-HCT) 320-12.5 MG tablet Take 1 tablet by mouth every evening.     . methocarbamol (ROBAXIN) 500 MG tablet Take 1 tablet (500 mg total) by mouth 2 (two) times daily. (Patient not taking: Reported on 09/22/2017) 20 tablet 0    No results found for this or any previous visit (from the past 48 hour(s)). No results found.  Pertinent items noted in HPI and remainder of comprehensive ROS otherwise negative.  Blood pressure (!) 150/96, pulse (!) 59, temperature 98.1 F (36.7 C), temperature source Oral, resp. rate 20, height 5\' 6"  (1.676 m), weight 100 kg (220 lb 8 oz), SpO2 97 %.  Patient is awake and alert.  He is oriented and appropriate.  Speech is fluent.  Judgment and insight are intact.  Cranial nerve function normal bilaterally.  Motor  examination intact bilateral.  Sensory examination with some patchy distal sensory loss in both hands.  Deep tendon flexes normal active.  No evidence of long tract signs.  Gait is normal.  Posture is mildly flexed.  Examination head ears eyes nose and throat unremarkable her chest and abdomen are obese but otherwise unremarkable.  Extremities are free from injury deformity. Assessment/Plan C4-5, C5-6, C6-7 spondylosis with stenosis.  Plan C4-5, C5-6, C6-7 anterior cervical discectomy with interbody fusion and anterior plate instrumentation.  Risks  and benefits of been explained.  Patient wishes to proceed.  Mallie Mussel A Akshith Moncus 10/03/2017, 10:55 AM

## 2017-10-04 MED ORDER — HYDRALAZINE HCL 20 MG/ML IJ SOLN
20.0000 mg | Freq: Four times a day (QID) | INTRAMUSCULAR | Status: DC | PRN
  Administered 2017-10-04: 20 mg via INTRAVENOUS
  Filled 2017-10-04: qty 1

## 2017-10-04 MED ORDER — OXYCODONE-ACETAMINOPHEN 5-325 MG PO TABS: 1.0000 | ORAL_TABLET | Freq: Three times a day (TID) | ORAL | 0 refills | Status: DC

## 2017-10-04 MED ORDER — HYDROXYZINE HCL 50 MG/ML IM SOLN
50.0000 mg | INTRAMUSCULAR | Status: DC | PRN
  Administered 2017-10-04: 50 mg via INTRAMUSCULAR
  Filled 2017-10-04: qty 1

## 2017-10-04 MED ORDER — DIPHENHYDRAMINE HCL 50 MG/ML IJ SOLN
INTRAMUSCULAR | Status: AC
  Filled 2017-10-04: qty 1

## 2017-10-04 MED ORDER — PROMETHAZINE HCL 25 MG/ML IJ SOLN
12.5000 mg | Freq: Four times a day (QID) | INTRAMUSCULAR | Status: DC | PRN
  Administered 2017-10-04: 25 mg via INTRAVENOUS
  Filled 2017-10-04: qty 1

## 2017-10-04 MED ORDER — NEBIVOLOL HCL 10 MG PO TABS
10.0000 mg | ORAL_TABLET | Freq: Every evening | ORAL | Status: DC
  Filled 2017-10-04: qty 1

## 2017-10-04 MED ORDER — NEBIVOLOL HCL 10 MG PO TABS: 10.0000 mg | ORAL_TABLET | Freq: Every evening | ORAL | 0 refills | Status: AC

## 2017-10-04 MED ORDER — METOPROLOL TARTRATE 25 MG PO TABS: 25.0000 mg | ORAL_TABLET | Freq: Two times a day (BID) | ORAL | Status: DC

## 2017-10-04 MED ORDER — DIPHENHYDRAMINE HCL 50 MG/ML IJ SOLN
25.0000 mg | Freq: Four times a day (QID) | INTRAMUSCULAR | Status: DC | PRN
  Administered 2017-10-04: 25 mg via INTRAVENOUS

## 2017-10-04 MED ORDER — PROMETHAZINE HCL 12.5 MG PO TABS: 12.5000 mg | ORAL_TABLET | Freq: Four times a day (QID) | ORAL | 0 refills | Status: DC | PRN

## 2017-10-04 MED ORDER — HYDRALAZINE HCL 20 MG/ML IJ SOLN: 10.0000 mg | Freq: Four times a day (QID) | INTRAMUSCULAR | Status: DC | PRN

## 2017-10-04 MED ORDER — CYCLOBENZAPRINE HCL 10 MG PO TABS: 10.0000 mg | ORAL_TABLET | Freq: Three times a day (TID) | ORAL | 0 refills | Status: DC | PRN

## 2017-10-04 MED ORDER — METOPROLOL TARTRATE 5 MG/5ML IV SOLN
10.0000 mg | Freq: Four times a day (QID) | INTRAVENOUS | Status: DC | PRN
  Filled 2017-10-04: qty 10

## 2017-10-04 NOTE — Progress Notes (Signed)
Patient alert and oriented, mae's well, voiding adequate amount of urine, swallowing without difficulty, no c/o pain or nausea at time of discharge. Patient discharged home with family. Script and discharged instructions given to patient. Patient and family stated understanding of instructions given. Patient has an appointment with Dr. Annette Stable

## 2017-10-04 NOTE — Progress Notes (Signed)
Patient alert and oriented with BP 152/91,HR 104. Patient remain stable with no s/s of distress. Patient has no c/o headache or dizziness. Patient's family in room with patient and are in agreement for patient to be discharged home. MD notified with BP result and order received to discharged patient home with the understanding for patient to follow-up with his PCP in a week.

## 2017-10-04 NOTE — Progress Notes (Signed)
Patient ID: Joseph Fisher, male   DOB: 01/02/1966, 51 y.o.   MRN: [id number] Afebrile heart rate 108 blood pressure 155/98   Patient with condition of nausea otherwise reports radicular pain is better.  Strength out of 5 wound clean dry and intact  Mobilize this morning. Will start metoprolol Nausea is better we'll discharge.

## 2017-10-04 NOTE — Discharge Instructions (Signed)

## 2017-10-04 NOTE — Discharge Summary (Signed)
Physician Discharge Summary  Patient ID: Joseph Fisher MRN: [id number] DOB/AGE: [birth year] 51 y.o.  Admit date: 10/03/2017 Discharge date: 10/04/2017  Admission Diagnoses: Cervical spondylitic radiculopathy  Discharge Diagnoses: Same Active Problems:   Cervical spinal stenosis   Discharged Condition: good  Hospital Course: Patient is also underwent into cervical discectomies and fusion postoperatively patient did very well recovered in the floor on the floor patient was angling and voiding spontaneously had a little bit of trouble as well as blood pressure we increased his beta blocker. Also little trouble with nausea refractory to this morning and if it's better we'll let him be discharged.  Consults: Significant Diagnostic Studies: Treatments: ACDF Discharge Exam: Blood pressure (!) 155/98, pulse (!) 108, temperature 97.7 F (36.5 C), resp. rate 18, height 5\' 6"  (1.676 m), weight 100 kg (220 lb 8 oz), SpO2 95 %. Awake alert strength out of 5 wound clean dry and intact  Disposition: Home   Allergies as of 10/04/2017   No Known Allergies     Medication List    TAKE these medications   azelastine 0.1 % nasal spray Commonly known as:  ASTELIN Place 2 sprays into both nostrils daily as needed for rhinitis or allergies.   cyclobenzaprine 10 MG tablet Commonly known as:  FLEXERIL Take 1 tablet (10 mg total) by mouth 3 (three) times daily as needed for muscle spasms.   methocarbamol 500 MG tablet Commonly known as:  ROBAXIN Take 1 tablet (500 mg total) by mouth 2 (two) times daily.   nebivolol 5 MG tablet Commonly known as:  BYSTOLIC Take 5 mg by mouth every evening. What changed:  Another medication with the same name was added. Make sure you understand how and when to take each.   nebivolol 10 MG tablet Commonly known as:  BYSTOLIC Take 1 tablet (10 mg total) by mouth every evening. What changed:  You were already taking a medication with the same name, and this  prescription was added. Make sure you understand how and when to take each.   omeprazole 40 MG capsule Commonly known as:  PRILOSEC Take 1 capsule (40 mg total) 2 (two) times daily before a meal by mouth. What changed:  when to take this   oxyCODONE-acetaminophen 10-325 MG tablet Commonly known as:  PERCOCET Take 1 tablet by mouth 3 (three) times daily. From pain management. What changed:  Another medication with the same name was added. Make sure you understand how and when to take each.   oxyCODONE-acetaminophen 5-325 MG tablet Commonly known as:  PERCOCET/ROXICET Take 1 tablet by mouth 3 (three) times daily. What changed:  You were already taking a medication with the same name, and this prescription was added. Make sure you understand how and when to take each.   pregabalin 150 MG capsule Commonly known as:  LYRICA Take 150 mg by mouth 2 (two) times daily.   TYLENOL PO Take 3-4 tablets by mouth every 6 (six) hours as needed (depends on pain if takes 3-4 tablets).   valsartan-hydrochlorothiazide 320-12.5 MG tablet Commonly known as:  DIOVAN-HCT Take 1 tablet by mouth every evening.        Signed: Traeson Dusza P 10/04/2017, 9:24 AM

## 2017-10-04 NOTE — Progress Notes (Signed)
Patient's blood pressure cont to be elevated despite medication. Also nausea and vomitting persist even after nausea meds. On call Glenford Peers NP   10/03/17 1939 10/03/17 2100 10/03/17 2219  Vitals  Temp 98.2 F (36.8 C) --  --   Temp Source Oral --  --   BP (!) 180/115 (!) 180/120 (!) 170/110  BP Location Left Arm --  Right Arm  BP Method Manual --  Manual  Patient Position (if appropriate) Lying --  Lying  Pulse Rate 98 --  70  Pulse Rate Source Radial --  Radial  Resp 18 --  --   Oxygen Therapy  SpO2 99 % --  --   O2 Device Room Air --  --      10/03/17 2327 10/04/17 0119 10/04/17 0212  Vitals  Temp 97.7 F (36.5 C) --  --   Temp Source Oral --  --   BP (!) 159/111 (RN notified) (!) 180/110 (!) 150/110  BP Location Right Arm Right Arm Right Arm  BP Method Automatic Manual Manual  Patient Position (if appropriate) Sitting --  --   Pulse Rate (!) 105 --  --   Pulse Rate Source Dinamap --  --   Resp 16 --  --   Oxygen Therapy  SpO2 95 % --  --   O2 Device Room Air --  --      10/04/17 0321 10/04/17 0500  Vitals  Temp --  --   Temp Source --  --   BP (!) 162/109 --   BP Location Right Arm Right Arm  BP Method Automatic Automatic  Patient Position (if appropriate) Lying Lying  Pulse Rate (!) 110 99  Pulse Rate Source --  --   Resp --  --   Oxygen Therapy  SpO2 --  --   O2 Device --  --    notified and aware about pt's current state. No further order received. Patient asleep at this time. Will cont to moniot closely.

## 2017-10-05 NOTE — Anesthesia Postprocedure Evaluation (Signed)
Anesthesia Post Note  Patient: BURLIE CAJAMARCA  Procedure(s) Performed: ACDF - C4-C5 - C5-C6 - C6-C7 (N/A )     Patient location during evaluation: PACU Anesthesia Type: General Level of consciousness: awake and alert Pain management: pain level controlled Vital Signs Assessment: post-procedure vital signs reviewed and stable Respiratory status: spontaneous breathing, nonlabored ventilation, respiratory function stable and patient connected to nasal cannula oxygen Cardiovascular status: blood pressure returned to baseline and stable Postop Assessment: no apparent nausea or vomiting Anesthetic complications: no    Last Vitals:  Vitals:   10/04/17 0815 10/04/17 1109  BP: (!) 155/98 (!) 152/91  Pulse: (!) 108 (!) 104  Resp: 18   Temp: 36.5 C   SpO2: 95%     Last Pain:  Vitals:   10/04/17 0616  TempSrc:   PainSc: 5                  Catalina Gravel

## 2017-10-06 ENCOUNTER — Encounter (HOSPITAL_COMMUNITY): Payer: Self-pay | Admitting: Neurosurgery

## 2017-11-13 ENCOUNTER — Other Ambulatory Visit (HOSPITAL_BASED_OUTPATIENT_CLINIC_OR_DEPARTMENT_OTHER): Payer: Self-pay

## 2017-11-13 DIAGNOSIS — G4730 Sleep apnea, unspecified: Secondary | ICD-10-CM

## 2017-11-13 DIAGNOSIS — R0683 Snoring: Secondary | ICD-10-CM

## 2017-11-13 DIAGNOSIS — G4710 Hypersomnia, unspecified: Secondary | ICD-10-CM

## 2017-11-13 DIAGNOSIS — R5383 Other fatigue: Secondary | ICD-10-CM

## 2017-11-27 ENCOUNTER — Ambulatory Visit (HOSPITAL_BASED_OUTPATIENT_CLINIC_OR_DEPARTMENT_OTHER): Payer: Medicare Other | Attending: Physician Assistant | Admitting: Internal Medicine

## 2017-11-27 VITALS — Ht 66.0 in | Wt 210.0 lb

## 2017-11-27 DIAGNOSIS — G4733 Obstructive sleep apnea (adult) (pediatric): Secondary | ICD-10-CM | POA: Diagnosis not present

## 2017-11-27 DIAGNOSIS — G4719 Other hypersomnia: Secondary | ICD-10-CM | POA: Diagnosis present

## 2017-11-27 DIAGNOSIS — R0683 Snoring: Secondary | ICD-10-CM | POA: Diagnosis present

## 2017-11-27 DIAGNOSIS — G4730 Sleep apnea, unspecified: Secondary | ICD-10-CM

## 2017-11-27 DIAGNOSIS — G4710 Hypersomnia, unspecified: Secondary | ICD-10-CM

## 2017-11-27 DIAGNOSIS — R5383 Other fatigue: Secondary | ICD-10-CM | POA: Diagnosis present

## 2017-11-30 DIAGNOSIS — R0683 Snoring: Secondary | ICD-10-CM | POA: Diagnosis not present

## 2017-11-30 NOTE — Procedures (Signed)
Patient Name: Joseph Fisher, Joseph Fisher Date: 11/27/2017 Gender: Male D.O.B: 1966/07/29 Age (years): 62 Referring Provider: Nicki Reaper Long PA-C Height (inches): 66 Interpreting Physician: Baird Lyons MD, ABSM Weight (lbs): 210 RPSGT: Zadie Rhine BMI: 34 MRN: [id number] Neck Size: 20.50 <br> <br> CLINICAL INFORMATION Sleep Study Type: Split Night CPAP  Indication for sleep study: Excessive Daytime Sleepiness, Fatigue, Snoring  Epworth Sleepiness Score: 4  SLEEP STUDY TECHNIQUE As per the AASM Manual for the Scoring of Sleep and Associated Events v2.3 (April 2016) with a hypopnea requiring 4% desaturations.  The channels recorded and monitored were frontal, central and occipital EEG, electrooculogram (EOG), submentalis EMG (chin), nasal and oral airflow, thoracic and abdominal wall motion, anterior tibialis EMG, snore microphone, electrocardiogram, and pulse oximetry. Continuous positive airway pressure (CPAP) was initiated when the patient met split night criteria and was titrated according to treat sleep-disordered breathing.  MEDICATIONS Medications self-administered by patient taken the night of the study : none reported  RESPIRATORY PARAMETERS Diagnostic  Total AHI (/hr): 69.0 RDI (/hr): 78.1 OA Index (/hr): 36.1 CA Index (/hr): 0.0 REM AHI (/hr): 80.0 NREM AHI (/hr): 67.9 Supine AHI (/hr): 69.0 Non-supine AHI (/hr): N/A Min O2 Sat (%): 73.00 Mean O2 (%): 90.47 Time below 88% (min): 34.0   Titration  Optimal Pressure (cm): 19 AHI at Optimal Pressure (/hr): 0.0 Min O2 at Optimal Pressure (%): 93.0 Supine % at Optimal (%): 100 Sleep % at Optimal (%): 99   SLEEP ARCHITECTURE The recording time for the entire night was 438.1 minutes.  During a baseline period of 154.6 minutes, the patient slept for 131.3 minutes in REM and nonREM, yielding a sleep efficiency of 84.9%. Sleep onset after lights out was 6.3 minutes with a REM latency of 115.0 minutes. The patient spent 12.95% of  the night in stage N1 sleep, 77.91% in stage N2 sleep, 0.00% in stage N3 and 9.14% in REM.  During the titration period of 277.6 minutes, the patient slept for 198.5 minutes in REM and nonREM, yielding a sleep efficiency of 71.5%. Sleep onset after CPAP initiation was 24.3 minutes with a REM latency of 139.5 minutes. The patient spent 20.40% of the night in stage N1 sleep, 47.36% in stage N2 sleep, 0.00% in stage N3 and 32.24% in REM.  CARDIAC DATA The 2 lead EKG demonstrated sinus rhythm. The mean heart rate was 52.99 beats per minute. Other EKG findings include: None.  LEG MOVEMENT DATA The total Periodic Limb Movements of Sleep (PLMS) were 0. The PLMS index was 0.00 .  IMPRESSIONS - Severe obstructive sleep apnea occurred during the diagnostic portion of the study (AHI = 69.0/hour). An optimal PAP pressure was selected for this patient ( 19 cm of water) - No significant central sleep apnea occurred during the diagnostic portion of the study (CAI = 0.0/hour). - Moderate oxygen desaturation was noted during the diagnostic portion of the study (Min O2 =73.00%). - No snoring was audible during the diagnostic portion of the study. - No cardiac abnormalities were noted during this study. - Clinically significant periodic limb movements did not occur during sleep. DIAGNOSIS - Obstructive Sleep Apnea (327.23 [G47.33 ICD-10])  RECOMMENDATIONS - Trial of CPAP therapy on 19 cm H2O with a Medium size Resmed Full Face Mask AirFit F20 mask and heated humidification. - Be careful with alcohol, sedatives and other CNS depressants that may worsen sleep apnea and disrupt normal sleep architecture. - Sleep hygiene should be reviewed to assess factors that may improve sleep quality. - Weight management  and regular exercise should be initiated or continued.  [Electronically signed] 11/30/2017 03:11 PM  Baird Lyons MD, [place], American Board of Sleep Medicine   NPI: [id number]                          [place]  ELECTRONICALLY SIGNED ON:  11/30/2017, 3:10 PM [place] PH: [phone]   FX: [phone] [place]

## 2018-01-07 ENCOUNTER — Other Ambulatory Visit: Payer: Self-pay | Admitting: *Deleted

## 2018-01-07 MED ORDER — OMEPRAZOLE 40 MG PO CPDR: 40.0000 mg | DELAYED_RELEASE_CAPSULE | Freq: Two times a day (BID) | ORAL | 0 refills | Status: DC

## 2018-04-03 ENCOUNTER — Other Ambulatory Visit: Payer: Self-pay | Admitting: Internal Medicine

## 2018-06-30 ENCOUNTER — Other Ambulatory Visit: Payer: Self-pay | Admitting: Internal Medicine

## 2018-07-24 ENCOUNTER — Other Ambulatory Visit: Payer: Self-pay | Admitting: Internal Medicine

## 2018-09-09 ENCOUNTER — Other Ambulatory Visit: Payer: Self-pay | Admitting: Internal Medicine

## 2018-09-11 ENCOUNTER — Other Ambulatory Visit: Payer: Self-pay | Admitting: Internal Medicine

## 2018-09-14 ENCOUNTER — Telehealth: Payer: Self-pay | Admitting: Internal Medicine

## 2018-09-14 MED ORDER — OMEPRAZOLE 40 MG PO CPDR: DELAYED_RELEASE_CAPSULE | ORAL | 0 refills | Status: DC

## 2018-09-14 NOTE — Telephone Encounter (Signed)
Rx sent until appointment on 09/30/18

## 2018-09-14 NOTE — Telephone Encounter (Signed)
Left voicemail for patient to call back. He needs office visit before refills can be sent. I have scheduled him for my only available visit, 09/30/18 at 2:00 pm. However, he needs to be advised of this and agree to come for appointment prior to me sending medication to his pharmacy until that time.

## 2018-09-14 NOTE — Telephone Encounter (Signed)
Pt returned your call and confirmed appt on 09-30-18.  Needs script sent to pharmacy

## 2018-09-22 ENCOUNTER — Encounter: Payer: Self-pay | Admitting: *Deleted

## 2018-09-30 ENCOUNTER — Encounter: Payer: Self-pay | Admitting: Internal Medicine

## 2018-09-30 ENCOUNTER — Ambulatory Visit (INDEPENDENT_AMBULATORY_CARE_PROVIDER_SITE_OTHER): Payer: Medicare Other | Admitting: Internal Medicine

## 2018-09-30 VITALS — BP 144/92 | HR 74 | Ht 66.0 in | Wt 228.1 lb

## 2018-09-30 DIAGNOSIS — K219 Gastro-esophageal reflux disease without esophagitis: Secondary | ICD-10-CM | POA: Diagnosis not present

## 2018-09-30 DIAGNOSIS — R1013 Epigastric pain: Secondary | ICD-10-CM | POA: Diagnosis not present

## 2018-09-30 DIAGNOSIS — K2270 Barrett's esophagus without dysplasia: Secondary | ICD-10-CM | POA: Diagnosis not present

## 2018-09-30 DIAGNOSIS — Z86010 Personal history of colonic polyps: Secondary | ICD-10-CM

## 2018-09-30 MED ORDER — OMEPRAZOLE 40 MG PO CPDR
DELAYED_RELEASE_CAPSULE | ORAL | 3 refills | Status: DC
Start: 2018-09-30 — End: 2019-10-07

## 2018-09-30 NOTE — Progress Notes (Signed)
   Subjective:    Patient ID: Joseph Fisher, male    DOB: [birth year], 52 y.o.   MRN: [id number]  HPI Prem Coykendall is a 52 year old male with a history of GERD with long segment Barrett's esophagus without dysplasia, history of peptic ulcer disease, history of multiple tubular adenoma of the colon here for follow-up.  He was last seen at the time of upper endoscopy and colonoscopy on 05/21/2017.  He reports that he has been doing well.  He does continue with omeprazole 40 mg twice daily.  Occasionally at night while he is lying down he will have regurgitation of fluid which is non-burning.  This can cause coughing and throat clearing.  No heartburn or dysphagia/odynophagia.  He has some intermittent abdominal discomfort and bloating symptom.  Stools are regular without blood or melena.  He takes oxycodone for chronic back and neck pain.   Review of Systems As per HPI, otherwise negative  Current Medications, Allergies, Past Medical History, Past Surgical History, Family History and Social History were reviewed in [employer] record.     Objective:   Physical Exam BP (!) 144/92   Pulse 74   Ht 5\' 6"  (1.676 m)   Wt 228 lb 2 oz (103.5 kg)   BMI 36.82 kg/m  Constitutional: Well-developed and well-nourished. No distress. HEENT: Normocephalic and atraumatic.  Conjunctivae are normal.  No scleral icterus. Neck: Neck supple. Trachea midline. Cardiovascular: Normal rate, regular rhythm and intact distal pulses. No M/R/G Pulmonary/chest: Effort normal and breath sounds normal. No wheezing, rales or rhonchi. Abdominal: Soft, nontender, nondistended. Bowel sounds active throughout. There are no masses palpable. No hepatosplenomegaly.  Diastases recti Extremities: no clubbing, cyanosis, or edema Neurological: Alert and oriented to person place and time. Skin: Skin is warm and dry. Psychiatric: Normal mood and affect. Behavior is normal.     Assessment & Plan:    52 year old male with a history of GERD with long segment Barrett's esophagus without dysplasia, history of peptic ulcer disease, history of multiple tubular adenoma of the colon here for follow-up.  1.  GERD with Barrett's esophagus --we discussed his nocturnal reflux symptoms and I have recommended that he avoid drinking fluids within 60 minutes of lying down and any solid foods within 120 minutes of lying down.  Consider elevating head of bed.  We discussed how PPI does not prevent physical reflux but does control the acid part of the reflux. --Continue omeprazole 40 mg twice daily before first and last meal of the day --Surveillance endoscopy recommended July 2021  2.  History of adenomatous colon polyps --3-year surveillance interval, repeat colonoscopy July 2021  3.  Abdominal pain and bloating --may be secondary to IBS but could also be related to chronic narcotic use.  He denies constipation.  FDgard samples given to see if this helps with this pain.  Can use 2 capsules up to 3 times a day with meals.  Notify me if this worsens.  Follow-up in 18 months at which point will be time to discuss Endo and colon  25 minutes spent with the patient today. Greater than 50% was spent in counseling and coordination of care with the patient

## 2018-09-30 NOTE — Patient Instructions (Signed)
Continue omeprazole 40 mg twice daily.  FD Gard samples have been given to you. Please purchase these over the counter and use as needed for bloating.  Please follow up with Dr Hilarie Fredrickson in the office in 18 months, sooner if needed.  If you are age 52 or older, your body mass index should be between 23-30. Your Body mass index is 36.82 kg/m. If this is out of the aforementioned range listed, please consider follow up with your Primary Care Provider.  If you are age 64 or younger, your body mass index should be between 19-25. Your Body mass index is 36.82 kg/m. If this is out of the aformentioned range listed, please consider follow up with your Primary Care Provider.

## 2018-12-09 ENCOUNTER — Encounter: Payer: Self-pay | Admitting: Pulmonary Disease

## 2018-12-09 ENCOUNTER — Ambulatory Visit (INDEPENDENT_AMBULATORY_CARE_PROVIDER_SITE_OTHER): Payer: Medicare Other | Admitting: Pulmonary Disease

## 2018-12-09 VITALS — BP 120/86 | HR 60 | Ht 66.0 in | Wt 223.0 lb

## 2018-12-09 DIAGNOSIS — E669 Obesity, unspecified: Secondary | ICD-10-CM

## 2018-12-09 DIAGNOSIS — G4733 Obstructive sleep apnea (adult) (pediatric): Secondary | ICD-10-CM

## 2018-12-09 DIAGNOSIS — G4730 Sleep apnea, unspecified: Secondary | ICD-10-CM

## 2018-12-09 NOTE — Progress Notes (Signed)
[facility]  Chief Complaint  Patient presents with  . Sleep Consult    OSA not on CPAP, no abnormal breathing symptoms    Constitutional:  BP 120/86 (BP Location: Left Arm, Cuff Size: Normal)   Pulse 60   Ht 5\' 6"  (1.676 m)   Wt 223 lb (101.2 kg)   SpO2 94%   BMI 35.99 kg/m   Past Medical History:  Anemia, RLS, PNA, PUD,  Insomnia, HTN, Hemorrhagic CVA, GERD, Diverticulosis, Cervical spine stenosis, Barrett's esophagus  Brief Summary:  Joseph Fisher is a 53 y.o. male with obstructive sleep apnea.  He had a trouble with his sleep.  He snores and would wake up feeling choked.  He was told he stopped breathing at night.  He had a sleep study in January 2019 that showed severe sleep apnea.  Was started on CPAP, but had trouble with mask fit.  He was told he was non compliant and CPAP was taken away.  His sleep pattern has gotten worse.  He has trouble staying awake when sitting quiet and has trouble staying focused.  He goes to sleep at 10 pm.  He falls asleep in 30 minutes.  He wakes up a few times to use the bathroom.  He gets out of bed at 8 am.  He feels tire in the morning.  He sometimes gets morning headache.  He does not use anything to help him fall sleep or stay awake.  He denies sleep walking, sleep talking, bruxism, or nightmares.  There is no history of restless legs.  He denies sleep hallucinations, sleep paralysis, or cataplexy.  The Epworth score is 6 out of 24.     Physical Exam:   Appearance - well kempt   ENMT - clear nasal mucosa, midline nasal  septum, no oral exudates, no LAN, trachea midline  Respiratory - normal chest wall, normal respiratory effort, no accessory muscle use, no wheeze/rales  CV - s1s2 regular rate and rhythm, no murmurs, no peripheral edema, radial pulses symmetric  GI - soft, non tender, no masses  Lymph - no adenopathy noted in neck and axillary areas  MSK - normal gait  Ext - no  cyanosis, clubbing, or joint inflammation noted  Skin - no rashes, lesions, or ulcers  Neuro - normal strength, oriented x 3  Psych - normal mood and affect  Discussion:  He has snoring, sleep disruption, daytime sleepiness, and apnea.  He has history of HTN and CVA.  His BMI is > 35.  He had previous sleep study showing severe sleep apnea.  He had trouble with initial CPAP set up.  His sleep pattern has gotten worse.  He likely still has sleep apnea.  Assessment/Plan:   Snoring with excessive daytime sleepiness. - will need to arrange for a home sleep study to prove to insurance that he still has sleep apnea before being able to arrange for CPAP set up again  Obesity. - discussed how weight can impact sleep and risk for sleep disordered breathing - discussed options to assist with weight loss: combination of diet modification, cardiovascular and strength training exercises  Cardiovascular risk. - had an extensive discussion regarding the adverse health consequences related to untreated sleep disordered breathing - specifically discussed the risks for hypertension, coronary artery disease, cardiac dysrhythmias, cerebrovascular disease, and diabetes - lifestyle modification discussed  Safe driving practices. - discussed how sleep disruption can increase risk of accidents, particularly when driving - safe driving practices  were discussed  Therapies for obstructive sleep apnea. - if the sleep study shows significant sleep apnea, then various therapies for treatment were reviewed: CPAP, oral appliance, and surgical interventions      Patient Instructions  Will need to arrange for home sleep study to prove to insurance that he is still has sleep apnea.   Will then arrange for auto CPAP set up and follow up in 2 months after getting CPAP.    Chesley Mires, MD [facility] Pager: [phone] 12/09/2018, 2:57 PM  Flow Sheet     Pulmonary tests:    Sleep  tests:  PSG 11/27/17 >> AHI 69, SpO2 low 73%  Cardiac tests:     Review of Systems:  Constitutional: Negative for fever and unexpected weight change.  HENT: Negative for congestion, dental problem, ear pain, nosebleeds, postnasal drip, rhinorrhea, sinus pressure, sneezing, sore throat and trouble swallowing.   Eyes: Positive for itching. Negative for redness.  Respiratory: Negative for cough, chest tightness, shortness of breath and wheezing.   Cardiovascular: Negative for palpitations and leg swelling.  Gastrointestinal: Negative for nausea and vomiting.  Genitourinary: Negative for dysuria.  Musculoskeletal: Negative for joint swelling.  Skin: Negative for rash.  Allergic/Immunologic: Negative.  Negative for environmental allergies, food allergies and immunocompromised state.  Neurological: Positive for headaches.  Hematological: Does not bruise/bleed easily.  Psychiatric/Behavioral: Negative for dysphoric mood. The patient is not nervous/anxious.    Medications:   Allergies as of 12/09/2018   No Known Allergies     Medication List       Accurate as of December 09, 2018  2:57 PM. Always use your most recent med list.        azelastine 0.1 % nasal spray Commonly known as:  ASTELIN Place 2 sprays into both nostrils daily as needed for rhinitis or allergies.   cyclobenzaprine 10 MG tablet Commonly known as:  FLEXERIL Take 1 tablet (10 mg total) by mouth 3 (three) times daily as needed for muscle spasms.   nebivolol 10 MG tablet Commonly known as:  BYSTOLIC Take 1 tablet (10 mg total) by mouth every evening.   omeprazole 40 MG capsule Commonly known as:  PRILOSEC TAKE 1 CAPSULE BY MOUTH 2 TIMES DAILY BEFORE A MEAL.   oxyCODONE-acetaminophen 10-325 MG tablet Commonly known as:  PERCOCET Take 1 tablet by mouth 3 (three) times daily. From pain management.   promethazine 12.5 MG tablet Commonly known as:  PHENERGAN Take 1 tablet (12.5 mg total) by mouth every 6 (six)  hours as needed for nausea or vomiting.   TYLENOL PO Take 3-4 tablets by mouth every 6 (six) hours as needed (depends on pain if takes 3-4 tablets).   valsartan-hydrochlorothiazide 320-12.5 MG tablet Commonly known as:  DIOVAN-HCT Take 1 tablet by mouth every evening.       Past Surgical History:  He  has a past surgical history that includes Back surgery (2013); Esophagogastroduodenoscopy (N/A, 06/04/2014); and Anterior cervical decomp/discectomy fusion (N/A, 10/03/2017).  Family History:  His family history includes Hypertension in his father and mother.  Social History:  He  reports that he has never smoked. He has never used smokeless tobacco. He reports that he does not drink alcohol or use drugs.

## 2018-12-09 NOTE — Patient Instructions (Addendum)
Will need to arrange for home sleep study to prove to insurance that he is still has sleep apnea.   Will then arrange for auto CPAP set up and follow up in 2 months after getting CPAP.

## 2018-12-09 NOTE — Progress Notes (Signed)
   Subjective:    Patient ID: Joseph Fisher, male    DOB: 10-13-1966, 53 y.o.   MRN: [id number]  HPI    Review of Systems  Constitutional: Negative for fever and unexpected weight change.  HENT: Negative for congestion, dental problem, ear pain, nosebleeds, postnasal drip, rhinorrhea, sinus pressure, sneezing, sore throat and trouble swallowing.   Eyes: Positive for itching. Negative for redness.  Respiratory: Negative for cough, chest tightness, shortness of breath and wheezing.   Cardiovascular: Negative for palpitations and leg swelling.  Gastrointestinal: Negative for nausea and vomiting.  Genitourinary: Negative for dysuria.  Musculoskeletal: Negative for joint swelling.  Skin: Negative for rash.  Allergic/Immunologic: Negative.  Negative for environmental allergies, food allergies and immunocompromised state.  Neurological: Positive for headaches.  Hematological: Does not bruise/bleed easily.  Psychiatric/Behavioral: Negative for dysphoric mood. The patient is not nervous/anxious.        Objective:   Physical Exam        Assessment & Plan:

## 2018-12-25 DIAGNOSIS — G4733 Obstructive sleep apnea (adult) (pediatric): Secondary | ICD-10-CM

## 2018-12-26 DIAGNOSIS — G4733 Obstructive sleep apnea (adult) (pediatric): Secondary | ICD-10-CM

## 2018-12-30 ENCOUNTER — Telehealth: Payer: Self-pay | Admitting: Pulmonary Disease

## 2018-12-30 DIAGNOSIS — G4733 Obstructive sleep apnea (adult) (pediatric): Secondary | ICD-10-CM

## 2018-12-30 NOTE — Telephone Encounter (Signed)
HST 12/26/18 >> AHI 69.8, SpO2 low 54%   Please inform him that his sleep study shows severe obstructive sleep apnea.  Please arrange for ROV with me or NP to discuss treatment options.

## 2019-01-01 NOTE — Telephone Encounter (Signed)
Called patient, phone was picked up and no one could hear me. Will call back.

## 2019-01-01 NOTE — Telephone Encounter (Signed)
Pt is calling back [phone]

## 2019-01-01 NOTE — Telephone Encounter (Signed)
LMTCB

## 2019-01-04 NOTE — Telephone Encounter (Signed)
Pt returning call [phone]

## 2019-01-04 NOTE — Telephone Encounter (Signed)
Advised pt of results. Pt understood and nothing further is needed.   Appt made with Aaron Edelman to discuss treatment options.

## 2019-01-05 NOTE — Progress Notes (Signed)
@Patient  ID: Joseph Fisher, male    DOB: [birth year], 53 y.o.   MRN: [id number]  Chief Complaint  Patient presents with  . Follow-up    osa / hst results    Referring provider: Shanon Rosser, PA-C  HPI:  53 year old male never smoker followed in our office for obstructive sleep apnea  PMH: Anemia, insomnia Smoker/ Smoking History: Never smoker Maintenance: None Pt of: Dr. Halford Chessman  01/06/2019  - Visit   53 year old male never smoker presenting to our office today to review home sleep study results.  Home sleep study results listed below:  Home sleep study 12/26/2018-AHI 69.8, SPO2 low 54%, average SaO2 84% 01/06/2019-Epworth 5  Patient previously been managed on CPAP in 2019 per patient.  Per patient he stopped this due to the fact that his mask was uncomfortable.  Patient was using a fullface mask at this time.  Patient is interested in resuming CPAP therapy but would like to try nasal mask.   Tests:   HST 12/26/18 >> AHI 69.8, SpO2 low 54%  Results of the Epworth flowsheet 12/09/2018  Sitting and reading 0  Watching TV 2  Sitting, inactive in a public place (e.g. a theatre or a meeting) 0  As a passenger in a car for an hour without a break 1  Lying down to rest in the afternoon when circumstances permit 1  Sitting and talking to someone 0  Sitting quietly after a lunch without alcohol 0  In a car, while stopped for a few minutes in traffic 0  Total score 4     FENO:  No results found for: NITRICOXIDE  PFT: No flowsheet data found.  Imaging: No results found.    Specialty Problems      Pulmonary Problems   Snoring   OSA (obstructive sleep apnea)    HST 12/26/18 >> AHI 69.8, SpO2 low 54%          No Known Allergies  Immunization History  Administered Date(s) Administered  . Influenza Inj Mdck Quad With Preservative 12/16/2016  . Influenza,inj,Quad PF,6+ Mos 07/14/2014, 08/13/2018  . Influenza,inj,quad, With Preservative 09/02/2017  . Td 05/28/2014      Past Medical History:  Diagnosis Date  . Anemia, unspecified 07/14/2014  . Barrett's esophagus   . Blood transfusion without reported diagnosis 05/2014  . Cervical stenosis of spine   . Chronic back pain   . Diverticulosis   . External hemorrhoids   . Gastric ulcer   . GERD (gastroesophageal reflux disease)   . GI bleed   . Hemorrhagic shock ([place])   . Hypertension   . Hypoxemia 06/20/2014  . Insomnia   . OSA (obstructive sleep apnea) 01/06/2019  . Peptic ulcer   . Pneumonia   . RLS (restless legs syndrome) 06/20/2014  . Snoring 06/20/2014  . Unspecified deficiency anemia 07/14/2014    Tobacco History: Social History   Tobacco Use  Smoking Status Never Smoker  Smokeless Tobacco Never Used   Counseling given: Not Answered  Continue to not smoke  Outpatient Encounter Medications as of 01/06/2019  Medication Sig  . Acetaminophen (TYLENOL PO) Take 3-4 tablets by mouth every 6 (six) hours as needed (depends on pain if takes 3-4 tablets).   Marland Kitchen azelastine (ASTELIN) 0.1 % nasal spray Place 2 sprays into both nostrils daily as needed for rhinitis or allergies.   . cyclobenzaprine (FLEXERIL) 10 MG tablet Take 1 tablet (10 mg total) by mouth 3 (three) times daily as needed for muscle spasms.  Marland Kitchen  nebivolol (BYSTOLIC) 10 MG tablet Take 1 tablet (10 mg total) by mouth every evening.  Marland Kitchen omeprazole (PRILOSEC) 40 MG capsule TAKE 1 CAPSULE BY MOUTH 2 TIMES DAILY BEFORE A MEAL.  Marland Kitchen oxyCODONE-acetaminophen (PERCOCET) 10-325 MG tablet Take 1 tablet by mouth 3 (three) times daily. From pain management.   . promethazine (PHENERGAN) 12.5 MG tablet Take 1 tablet (12.5 mg total) by mouth every 6 (six) hours as needed for nausea or vomiting.  . valsartan-hydrochlorothiazide (DIOVAN-HCT) 320-12.5 MG tablet Take 1 tablet by mouth every evening.    No facility-administered encounter medications on file as of 01/06/2019.      Review of Systems  Review of Systems  Constitutional: Negative for activity  change, chills, fatigue, fever and unexpected weight change.  HENT: Negative for congestion, postnasal drip, rhinorrhea, sinus pain and sore throat.   Eyes: Negative.   Respiratory: Negative for cough, shortness of breath and wheezing.   Cardiovascular: Negative for chest pain and palpitations.  Gastrointestinal: Negative for diarrhea, nausea and vomiting.  Endocrine: Negative.   Musculoskeletal: Negative.   Skin: Negative.   Neurological: Negative for dizziness and headaches.  Psychiatric/Behavioral: Negative.  Negative for dysphoric mood. The patient is not nervous/anxious.   All other systems reviewed and are negative.    Physical Exam  BP 124/80 (BP Location: Left Arm, Cuff Size: Large)   Pulse 64   Ht 5\' 6"  (1.676 m)   Wt 213 lb 12.8 oz (97 kg)   SpO2 96%   BMI 34.51 kg/m   Wt Readings from Last 5 Encounters:  01/06/19 213 lb 12.8 oz (97 kg)  12/09/18 223 lb (101.2 kg)  09/30/18 228 lb 2 oz (103.5 kg)  11/27/17 210 lb (95.3 kg)  10/03/17 220 lb 8 oz (100 kg)   Physical Exam  Constitutional: He is oriented to person, place, and time and well-developed, well-nourished, and in no distress. No distress.  HENT:  Head: Normocephalic and atraumatic.  Right Ear: Hearing, tympanic membrane, external ear and ear canal normal.  Left Ear: Hearing, tympanic membrane, external ear and ear canal normal.  Nose: Nose normal. Right sinus exhibits no maxillary sinus tenderness and no frontal sinus tenderness. Left sinus exhibits no maxillary sinus tenderness and no frontal sinus tenderness.  Mouth/Throat: Uvula is midline.  Mallampati 4  Eyes: Pupils are equal, round, and reactive to light.  Neck: Normal range of motion. Neck supple.  Cardiovascular: Normal rate, regular rhythm and normal heart sounds.  Pulmonary/Chest: Effort normal and breath sounds normal. No accessory muscle usage. No respiratory distress. He has no decreased breath sounds. He has no wheezes. He has no rhonchi.    Musculoskeletal: Normal range of motion.        General: No edema.  Lymphadenopathy:    He has no cervical adenopathy.  Neurological: He is alert and oriented to person, place, and time. Gait normal.  Skin: Skin is warm and dry. He is not diaphoretic. No erythema.  Psychiatric: Mood, memory, affect and judgment normal.  Nursing note and vitals reviewed.     Lab Results:  CBC    Component Value Date/Time   WBC 3.9 (L) 09/29/2017 1402   RBC 4.81 09/29/2017 1402   HGB 15.0 09/29/2017 1402   HGB 14.4 10/31/2014 1015   HCT 44.0 09/29/2017 1402   HCT 42.5 10/31/2014 1015   PLT 152 09/29/2017 1402   PLT 128 (L) 10/31/2014 1015   MCV 91.5 09/29/2017 1402   MCV 86.0 10/31/2014 1015   MCH 31.2  09/29/2017 1402   MCHC 34.1 09/29/2017 1402   RDW 12.8 09/29/2017 1402   RDW 14.7 (H) 10/31/2014 1015   LYMPHSABS 1.4 10/31/2014 1015   MONOABS 0.2 10/31/2014 1015   EOSABS 0.1 10/31/2014 1015   BASOSABS 0.0 10/31/2014 1015    BMET    Component Value Date/Time   NA 138 09/29/2017 1402   K 3.8 09/29/2017 1402   CL 103 09/29/2017 1402   CO2 27 09/29/2017 1402   GLUCOSE 123 (H) 09/29/2017 1402   BUN 12 09/29/2017 1402   CREATININE 0.91 09/29/2017 1402   CALCIUM 9.7 09/29/2017 1402   GFRNONAA >60 09/29/2017 1402   GFRAA >60 09/29/2017 1402    BNP No results found for: BNP  ProBNP    Component Value Date/Time   PROBNP 61.1 02/20/2013 0410      Assessment & Plan:     OSA (obstructive sleep apnea) Assessment: February/2020 sleep study showing AHI of 69.8, SPO2 low of 54%, average SaO2 84% Mallampati 4 on exam today BMI 34.5 Patient previously used CPAP in 2019 but stopped using it due to mask being uncomfortable (patient used full facemask at that time)  Plan: Restart CPAP DME: [facility] Patient needs 36-month follow-up If patient is compliant at next office visit need to consider ordering overnight oximetry with CPAP If patient continues to have issues with mask  fitting or compliance using CPAP then will need to proceed forward with CPAP titration Offered CPAP titration today but the patient preferred to do home start with auto titrating pressures     Lauraine Rinne, NP 01/06/2019   This appointment was 26 min long with over 50% of the time in direct face-to-face patient care, assessment, plan of care, and follow-up.

## 2019-01-06 ENCOUNTER — Encounter: Payer: Self-pay | Admitting: Pulmonary Disease

## 2019-01-06 ENCOUNTER — Ambulatory Visit (INDEPENDENT_AMBULATORY_CARE_PROVIDER_SITE_OTHER): Payer: Medicare Other | Admitting: Pulmonary Disease

## 2019-01-06 ENCOUNTER — Other Ambulatory Visit: Payer: Self-pay

## 2019-01-06 VITALS — BP 124/80 | HR 64 | Ht 66.0 in | Wt 213.8 lb

## 2019-01-06 DIAGNOSIS — G4733 Obstructive sleep apnea (adult) (pediatric): Secondary | ICD-10-CM | POA: Diagnosis not present

## 2019-01-06 HISTORY — DX: Obstructive sleep apnea (adult) (pediatric): G47.33

## 2019-01-06 NOTE — Patient Instructions (Addendum)
New CPAP start  DME: [facility]  [facility] - 5-15  Mask of choice  CPAP supplies   [facility] >>>Contact number for them is Dimas Chyle [phone] or [phone] ext [phone] >>>You can also try contacting Elza Rafter [phone] ext [phone]  We recommend that you continue using your CPAP daily >>>Keep up the hard work using your device >>> Goal should be wearing this for the entire night that you are sleeping, at least 4 to 6 hours  Remember:  . Do not drive or operate heavy machinery if tired or drowsy.  . Please notify the supply company and office if you are unable to use your device regularly due to missing supplies or machine being broken.  . Work on maintaining a healthy weight and following your recommended nutrition plan  . Maintain proper daily exercise and movement  . Maintaining proper use of your device can also help improve management of other chronic illnesses such as: Blood pressure, blood sugars, and weight management.   BiPAP/ CPAP Cleaning:  >>>Clean weekly, with Dawn soap, and bottle brush.  Set up to air dry.     Follow up with Dr. Halford Chessman or Wyn Quaker FNP in 2 months      It is flu season:   >>> Best ways to protect herself from the flu: Receive the yearly flu vaccine, practice good hand hygiene washing with soap and also using hand sanitizer when available, eat a nutritious meals, get adequate rest, hydrate appropriately   Please contact the office if your symptoms worsen or you have concerns that you are not improving.   Thank you for choosing [facility] for your healthcare, and for allowing [place] to partner with you on your healthcare journey. I am thankful to be able to provide care to you today.   Wyn Quaker FNP-C    CPAP and BPAP Information CPAP and BPAP are methods of helping a person breathe with the use of air pressure. CPAP stands for "continuous positive airway pressure." BPAP stands for "bi-level positive airway  pressure." In both methods, air is blown through your nose or mouth and into your air passages to help you breathe well. CPAP and BPAP use different amounts of pressure to blow air. With CPAP, the amount of pressure stays the same while you breathe in and out. With BPAP, the amount of pressure is increased when you breathe in (inhale) so that you can take larger breaths. Your health care provider will recommend whether CPAP or BPAP would be more helpful for you. Why are CPAP and BPAP treatments used? CPAP or BPAP can be helpful if you have:  Sleep apnea.  Chronic obstructive pulmonary disease (COPD).  Heart failure.  Medical conditions that weaken the muscles of the chest including muscular dystrophy, or neurological diseases such as amyotrophic lateral sclerosis (ALS).  Other problems that cause breathing to be weak, abnormal, or difficult. CPAP is most commonly used for obstructive sleep apnea (OSA) to keep the airways from collapsing when the muscles relax during sleep. How is CPAP or BPAP administered? Both CPAP and BPAP are provided by a small machine with a flexible plastic tube that attaches to a plastic mask. You wear the mask. Air is blown through the mask into your nose or mouth. The amount of pressure that is used to blow the air can be adjusted on the machine. Your health care provider will determine the pressure setting that should be used based on your  individual needs. When should CPAP or BPAP be used? In most cases, the mask only needs to be worn during sleep. Generally, the mask needs to be worn throughout the night and during any daytime naps. People with certain medical conditions may also need to wear the mask at other times when they are awake. Follow instructions from your health care provider about when to use the machine. What are some tips for using the mask?   Because the mask needs to be snug, some people feel trapped or closed-in (claustrophobic) when first using the  mask. If you feel this way, you may need to get used to the mask. One way to do this is by holding the mask loosely over your nose or mouth and then gradually applying the mask more snugly. You can also gradually increase the amount of time that you use the mask.  Masks are available in various types and sizes. Some fit over your mouth and nose while others fit over just your nose. If your mask does not fit well, talk with your health care provider about getting a different one.  If you are using a mask that fits over your nose and you tend to breathe through your mouth, a chin strap may be applied to help keep your mouth closed.  The CPAP and BPAP machines have alarms that may sound if the mask comes off or develops a leak.  If you have trouble with the mask, it is very important that you talk with your health care provider about finding a way to make the mask easier to tolerate. Do not stop using the mask. Stopping the use of the mask could have a negative impact on your health. What are some tips for using the machine?  Place your CPAP or BPAP machine on a secure table or stand near an electrical outlet.  Know where the on/off switch is located on the machine.  Follow instructions from your health care provider about how to set the pressure on your machine and when you should use it.  Do not eat or drink while the CPAP or BPAP machine is on. Food or fluids could get pushed into your lungs by the pressure of the CPAP or BPAP.  Do not smoke. Tobacco smoke residue can damage the machine.  For home use, CPAP and BPAP machines can be rented or purchased through home health care companies. Many different brands of machines are available. Renting a machine before purchasing may help you find out which particular machine works well for you.  Keep the CPAP or BPAP machine and attachments clean. Ask your health care provider for specific instructions. Get help right away if:  You have redness or  open areas around your nose or mouth where the mask fits.  You have trouble using the CPAP or BPAP machine.  You cannot tolerate wearing the CPAP or BPAP mask.  You have pain, discomfort, and bloating in your abdomen. Summary  CPAP and BPAP are methods of helping a person breathe with the use of air pressure.  Both CPAP and BPAP are provided by a small machine with a flexible plastic tube that attaches to a plastic mask.  If you have trouble with the mask, it is very important that you talk with your health care provider about finding a way to make the mask easier to tolerate. This information is not intended to replace advice given to you by your health care provider. Make sure you discuss any questions  you have with your health care provider. Document Released: 07/12/2004 Document Revised: 06/16/2018 Document Reviewed: 09/02/2016 Elsevier Interactive Patient Education  2019 [employer].

## 2019-01-06 NOTE — Assessment & Plan Note (Signed)
Assessment: February/2020 sleep study showing AHI of 69.8, SPO2 low of 54%, average SaO2 84% Mallampati 4 on exam today BMI 34.5 Patient previously used CPAP in 2019 but stopped using it due to mask being uncomfortable (patient used full facemask at that time)  Plan: Restart CPAP DME: [employer] Patient needs 74-month follow-up If patient is compliant at next office visit need to consider ordering overnight oximetry with CPAP If patient continues to have issues with mask fitting or compliance using CPAP then will need to proceed forward with CPAP titration Offered CPAP titration today but the patient preferred to do home start with auto titrating pressures

## 2019-03-08 ENCOUNTER — Telehealth: Payer: Self-pay | Admitting: Pulmonary Disease

## 2019-03-08 ENCOUNTER — Other Ambulatory Visit: Payer: Self-pay

## 2019-03-08 ENCOUNTER — Encounter: Payer: Self-pay | Admitting: Pulmonary Disease

## 2019-03-08 ENCOUNTER — Ambulatory Visit (INDEPENDENT_AMBULATORY_CARE_PROVIDER_SITE_OTHER): Payer: Medicare Other | Admitting: Pulmonary Disease

## 2019-03-08 DIAGNOSIS — G4733 Obstructive sleep apnea (adult) (pediatric): Secondary | ICD-10-CM

## 2019-03-08 MED ORDER — TRAZODONE HCL 50 MG PO TABS: 50.0000 mg | ORAL_TABLET | Freq: Every day | ORAL | 1 refills | Status: DC

## 2019-03-08 NOTE — Progress Notes (Signed)
Virtual Visit via Telephone Note  I connected with@ on 03/08/19 at 10:30 AM EDT by telephone and verified that I am speaking with the correct person using two identifiers.  Location: Patient: Home Provider: Office    I discussed the limitations, risks, security and privacy concerns of performing an evaluation and management service by telephone and the availability of in person appointments. I also discussed with the patient that there may be a patient responsible charge related to this service. The patient expressed understanding and agreed to proceed.  Patient consented to consult via telephone: Yes People present and their role in pt care: Pt    History of Present Illness:  53 year old male never smoker followed in our office for obstructive sleep apnea  PMH: Anemia, insomnia Smoker/ Smoking History: Never smoker Maintenance: None Pt of: Dr. Halford Chessman   Chief complaint: Severe OSA   53 year old male never smoker followed in our office for severe obstructive sleep apnea.  Patient completed a home sleep study in February/2020 that confirmed severe obstructive sleep apnea.  Patient also completed a January/2019 split-night sleep study that showed severe obstructive sleep apnea.  Patient reports that since starting CPAP therapy he is has struggled to get adequate sleep.  CPAP compliance shows excellent compliance.  See compliance report listed below:  02/05/2019-03/06/2019-CPAP compliance report-30 had a last 30 days use, 26 of those days greater than 4 hours, average usage 5 hours and 46 minutes, APAP settings 5-15, AHI 11.1, minimal leaks  Patient reporting that he feels the mask fit is good.  He feels the pressure settings are also good.  He does not feel like he is having leaks.  He just still feels that he is not having adequate sleep.  He feels more fatigued than prior to starting CPAP therapy.  Patient is wondering if there is any other options besides CPAP for treating his sleep  apnea.   Observations/Objective:  11/27/2017-split-night sleep study- severe obstructive sleep apnea AHI 69, optimal Pap pressure was selected for this patient at 19 cm of water, SaO2 low 73%, recommended mask: Medium size Resmed Full Face Mask AirFit F20 mask  HST 12/26/18 >> AHI 69.8, SpO2 low 54%  Assessment and Plan:  OSA (obstructive sleep apnea) Assessment: January/2019 split-night sleep study shows severe obstructive sleep apnea AHI of 69, recommending optimal Pap pressure of 19, recommend a mask medium size ResMed full facemask  air fit F20 mask February/2020 home sleep study shows severe obstructive sleep apnea CPAP compliance report today shows excellent compliance but AHI of 11.1 Patient reporting he is not comfortable using CPAP therapy at this time he feels more fatigued than before Patient is interested in other options besides CPAP therapy  Plan: After discussion with the patient today we will proceed forward with trying to change patient to pressure settings first.   >>>We will start patient on the set pressure of 19 that his January/2019 split-night sleep study recommended.  We will request a 4-week download to check compliance I will also send an order for mask of choice in case he would like to try the recommended mask from the January/2019 split-night sleep study Patient to contact our office if he is having difficulty using CPAP or is unable to tolerate the pressure of 19 Could then consider CPAP titration Also discussed with patient today the inspire hypoglossal nerve stimulator for obstructive sleep apnea Patient to research the inspire device, he will contact our office if he wants a referral to ENT Follow-up with Dr. Halford Chessman in  3 months   Follow Up Instructions:  Return in about 3 months (around 06/08/2019), or if symptoms worsen or fail to improve, for Follow up with Wyn Quaker FNP-C, Follow up with Dr. Halford Chessman.   I discussed the assessment and treatment plan with  the patient. The patient was provided an opportunity to ask questions and all were answered. The patient agreed with the plan and demonstrated an understanding of the instructions.   The patient was advised to call back or seek an in-person evaluation if the symptoms worsen or if the condition fails to improve as anticipated.  I provided 23 minutes of non-face-to-face time during this encounter.   Lauraine Rinne, NP

## 2019-03-08 NOTE — Telephone Encounter (Signed)
Placed another order for patient's cpap to make sure settings were correct. Order already placed for compliance report.  Nothing further at this time.

## 2019-03-08 NOTE — Patient Instructions (Addendum)
We will change pressure settings from APAP 5-15 to a set pressure of 19 >>> Based off of January/2019 split-night sleep study recommendations  Patient will contact our office if he cannot tolerate this pressure or he has issues  Will send order for mask of choice: >>> January/2019 split-night sleep study had a mask recommendation of: Medium size Resmed Full Face Mask AirFit F20 mask   We recommend that you continue using your CPAP daily >>>Keep up the hard work using your device >>> Goal should be wearing this for the entire night that you are sleeping, at least 4 to 6 hours  Remember:  . Do not drive or operate heavy machinery if tired or drowsy.  . Please notify the supply company and office if you are unable to use your device regularly due to missing supplies or machine being broken.  . Work on maintaining a healthy weight and following your recommended nutrition plan  . Maintain proper daily exercise and movement  . Maintaining proper use of your device can also help improve management of other chronic illnesses such as: Blood pressure, blood sugars, and weight management.   BiPAP/ CPAP Cleaning:  >>>Clean weekly, with Dawn soap, and bottle brush.  Set up to air dry.   If you cannot tolerate pressure change or still having issues using your CPAP then we need to consider a CPAP titration or a referral to ENT for an inspire device (hypoglossal nerve stimulator)   Return in about 3 months (around 06/08/2019), or if symptoms worsen or fail to improve, for Follow up with Wyn Quaker FNP-C, Follow up with Dr. Halford Chessman.    Coronavirus (COVID-19) Are you at risk?  Are you at risk for the Coronavirus (COVID-19)?  To be considered HIGH RISK for Coronavirus (COVID-19), you have to meet the following criteria:  . Traveled to [place], [place], [place], [place] or [place]; or in the [place] to [place], [place], [place], or [place]; and have fever, cough, and shortness of breath  within the last 2 weeks of travel OR . Been in close contact with a person diagnosed with COVID-19 within the last 2 weeks and have fever, cough, and shortness of breath . IF YOU DO NOT MEET THESE CRITERIA, YOU ARE CONSIDERED LOW RISK FOR COVID-19.  What to do if you are HIGH RISK for COVID-19?  Marland Kitchen If you are having a medical emergency, call 911. . Seek medical care right away. Before you go to a doctor's office, urgent care or emergency department, call ahead and tell them about your recent travel, contact with someone diagnosed with COVID-19, and your symptoms. You should receive instructions from your physician's office regarding next steps of care.  . When you arrive at healthcare provider, tell the healthcare staff immediately you have returned from visiting [place], [place], [place], [place] or [place]; or traveled in the [place] to [place], [place], [place], or [place]; in the last two weeks or you have been in close contact with a person diagnosed with COVID-19 in the last 2 weeks.   . Tell the health care staff about your symptoms: fever, cough and shortness of breath. . After you have been seen by a medical provider, you will be either: o Tested for (COVID-19) and discharged home on quarantine except to seek medical care if symptoms worsen, and asked to  - Stay home and avoid contact with others until you get your results (4-5 days)  - Avoid travel on public  transportation if possible (such as bus, train, or airplane) or o Sent to the Emergency Department by EMS for evaluation, COVID-19 testing, and possible admission depending on your condition and test results.  What to do if you are LOW RISK for COVID-19?  Reduce your risk of any infection by using the same precautions used for avoiding the common cold or flu:  Marland Kitchen Wash your hands often with soap and warm water for at least 20 seconds.  If soap and water are not readily available, use an alcohol-based hand sanitizer with at  least 60% alcohol.  . If coughing or sneezing, cover your mouth and nose by coughing or sneezing into the elbow areas of your shirt or coat, into a tissue or into your sleeve (not your hands). . Avoid shaking hands with others and consider head nods or verbal greetings only. . Avoid touching your eyes, nose, or mouth with unwashed hands.  . Avoid close contact with people who are sick. . Avoid places or events with large numbers of people in one location, like concerts or sporting events. . Carefully consider travel plans you have or are making. . If you are planning any travel outside or inside the [place], visit the CDC's Travelers' Health webpage for the latest health notices. . If you have some symptoms but not all symptoms, continue to monitor at home and seek medical attention if your symptoms worsen. . If you are having a medical emergency, call 911.   [place] / e-Visit: [email or web address]         [facility]: [facility]: [phone]                   [facility]: [phone]           It is flu season:   >>> Best ways to protect herself from the flu: Receive the yearly flu vaccine, practice good hand hygiene washing with soap and also using hand sanitizer when available, eat a nutritious meals, get adequate rest, hydrate appropriately   Please contact the office if your symptoms worsen or you have concerns that you are not improving.   Thank you for choosing [facility] for your healthcare, and for allowing [place] to partner with you on your healthcare journey. I am thankful to be able to provide care to you today.   Wyn Quaker FNP-C

## 2019-03-08 NOTE — Telephone Encounter (Signed)
03/08/2019 1453  Spoke with Dr. Halford Chessman regarding patient today.  Contacted patient to discuss recommendations from Dr. Halford Chessman.  Patient would like to proceed forward with prescription for trazodone and keeping original CPAP settings.  New orders:  CPAP settings: APAP 5-15 Take trazodone 50 mg daily at night.  Do not take more than 1 pill daily. Compliance report in 4 weeks to check on patient's compliance  If patient continues to struggle with CPAP therapy then patient would like to be considered for oral appliance  Lauren, please make sure CPAP settings are correct with DME company.  We will route to Dr. Halford Chessman as [place] FNP

## 2019-03-08 NOTE — Assessment & Plan Note (Signed)
Assessment: January/2019 split-night sleep study shows severe obstructive sleep apnea AHI of 69, recommending optimal Pap pressure of 19, recommend a mask medium size ResMed full facemask  air fit F20 mask February/2020 home sleep study shows severe obstructive sleep apnea CPAP compliance report today shows excellent compliance but AHI of 11.1 Patient reporting he is not comfortable using CPAP therapy at this time he feels more fatigued than before Patient is interested in other options besides CPAP therapy  Plan: After discussion with the patient today we will proceed forward with trying to change patient to pressure settings first.   >>>We will start patient on the set pressure of 19 that his January/2019 split-night sleep study recommended.  We will request a 4-week download to check compliance I will also send an order for mask of choice in case he would like to try the recommended mask from the January/2019 split-night sleep study Patient to contact our office if he is having difficulty using CPAP or is unable to tolerate the pressure of 19 Could then consider CPAP titration Also discussed with patient today the inspire hypoglossal nerve stimulator for obstructive sleep apnea Patient to research the inspire device, he will contact our office if he wants a referral to ENT Follow-up with Dr. Halford Chessman in 3 months

## 2019-04-15 NOTE — Progress Notes (Signed)
Reviewed and agree with assessment/plan.   Daylyn Christine, MD  Pulmonary/Critical Care 10/23/2016, 12:24 PM Pager:  [phone]  

## 2019-05-27 NOTE — Progress Notes (Signed)
@Patient  ID: Joseph Fisher, male    DOB: 1966/05/10, 53 y.o.   MRN: [id number]  Chief Complaint  Patient presents with  . Follow-up    still having difficulty sleeping with cpap - feels tense and uncomfortable    Referring provider: Shanon Rosser, PA-C  HPI:  53 year old male never smoker followed in our office for obstructive sleep apnea  PMH: Anemia, insomnia Smoker/ Smoking History: Never smoker Maintenance: None Pt of: Dr. Halford Chessman  05/28/2019  - Visit   53 year old male never smoker followed in our office for severe obstructive sleep apnea.  Patient has been managed on CPAP therapy for past couple of months.  Patient continues to struggle with CPAP pressures as well as overall quality of sleep.  Patient CPAP pressure recommended by split-night sleep study was a set pressure of 19, patient is barely able to tolerate the pressure settings that he is on now which is APAP 5-15, 95th percentile 11.3.  CPAP compliance report shows that patient is trying to use CPAP daily.  See CPAP compliance report listed below:  04/27/2019-05/26/2019-CPAP compliance report-26 and a last 30 days used, 7 days greater than 4 hours, average usage 3 hours and 15 minutes, APAP set pressure 5-15, 95th percentile 11.3, AHI 23.6  Patient reporting that the use of trazodone did not help.  When he is using his CPAP he feels very tense and unable to tolerate that.  He sleeps better without the CPAP.       Tests:   11/27/2017-split-night sleep study- severe obstructive sleep apnea AHI 69, optimal Pap pressure was selected for this patient at 19 cm of water, SaO2 low 73%, recommended mask: Medium size Resmed Full Face Mask AirFit F20 mask  HST 12/26/18 >> AHI 69.8, SpO2 low 54%  FENO:  No results found for: NITRICOXIDE  PFT: No flowsheet data found.  Imaging: No results found.    Specialty Problems      Pulmonary Problems   Snoring   OSA (obstructive sleep apnea)    11/27/2017-split-night sleep study-  severe obstructive sleep apnea AHI 69, optimal Pap pressure was selected for this patient at 19 cm of water, SaO2 low 73%, recommended mask: Medium size Resmed Full Face Mask AirFit F20 mask  HST 12/26/18 >> AHI 69.8, SpO2 low 54%       Obesity with sleep apnea      No Known Allergies  Immunization History  Administered Date(s) Administered  . Influenza Inj Mdck Quad With Preservative 12/16/2016  . Influenza,inj,Quad PF,6+ Mos 07/14/2014, 08/13/2018  . Influenza,inj,quad, With Preservative 09/02/2017  . Td 05/28/2014    Past Medical History:  Diagnosis Date  . Anemia, unspecified 07/14/2014  . Barrett's esophagus   . Blood transfusion without reported diagnosis 05/2014  . Cervical stenosis of spine   . Chronic back pain   . Diverticulosis   . External hemorrhoids   . Gastric ulcer   . GERD (gastroesophageal reflux disease)   . GI bleed   . Hemorrhagic shock ([place])   . Hypertension   . Hypoxemia 06/20/2014  . Insomnia   . OSA (obstructive sleep apnea) 01/06/2019  . Peptic ulcer   . Pneumonia   . RLS (restless legs syndrome) 06/20/2014  . Snoring 06/20/2014  . Unspecified deficiency anemia 07/14/2014    Tobacco History: Social History   Tobacco Use  Smoking Status Never Smoker  Smokeless Tobacco Never Used   Counseling given: Not Answered   Continue to not smoke  Outpatient Encounter Medications as of  05/28/2019  Medication Sig  . Acetaminophen (TYLENOL PO) Take 3-4 tablets by mouth every 6 (six) hours as needed (depends on pain if takes 3-4 tablets).   Marland Kitchen azelastine (ASTELIN) 0.1 % nasal spray Place 2 sprays into both nostrils daily as needed for rhinitis or allergies.   . cyclobenzaprine (FLEXERIL) 10 MG tablet Take 1 tablet (10 mg total) by mouth 3 (three) times daily as needed for muscle spasms.  . nebivolol (BYSTOLIC) 10 MG tablet Take 1 tablet (10 mg total) by mouth every evening.  Marland Kitchen omeprazole (PRILOSEC) 40 MG capsule TAKE 1 CAPSULE BY MOUTH 2 TIMES DAILY  BEFORE A MEAL.  Marland Kitchen oxyCODONE-acetaminophen (PERCOCET) 10-325 MG tablet Take 1 tablet by mouth 3 (three) times daily. From pain management.   . promethazine (PHENERGAN) 12.5 MG tablet Take 1 tablet (12.5 mg total) by mouth every 6 (six) hours as needed for nausea or vomiting.  . traZODone (DESYREL) 50 MG tablet Take 1 tablet (50 mg total) by mouth at bedtime.  . valsartan-hydrochlorothiazide (DIOVAN-HCT) 320-12.5 MG tablet Take 1 tablet by mouth every evening.    No facility-administered encounter medications on file as of 05/28/2019.      Review of Systems  Review of Systems  Constitutional: Negative for activity change, chills, fatigue, fever and unexpected weight change.  HENT: Negative for postnasal drip, rhinorrhea, sinus pressure, sinus pain and sore throat.   Eyes: Negative.   Respiratory: Negative for cough, shortness of breath and wheezing.   Cardiovascular: Negative for chest pain and palpitations.  Gastrointestinal: Negative for diarrhea, nausea and vomiting.  Endocrine: Negative.   Genitourinary: Negative.   Musculoskeletal: Negative.   Skin: Negative.   Neurological: Negative for dizziness and headaches.  Psychiatric/Behavioral: Positive for sleep disturbance. Negative for dysphoric mood. The patient is not nervous/anxious.   All other systems reviewed and are negative.    Physical Exam  BP 126/82 (BP Location: Left Arm, Patient Position: Sitting, Cuff Size: Normal)   Pulse 60   Temp 98.1 F (36.7 C)   Ht 5\' 6"  (1.676 m)   Wt 214 lb 3.2 oz (97.2 kg)   SpO2 99%   BMI 34.57 kg/m   Wt Readings from Last 5 Encounters:  05/28/19 214 lb 3.2 oz (97.2 kg)  01/06/19 213 lb 12.8 oz (97 kg)  12/09/18 223 lb (101.2 kg)  09/30/18 228 lb 2 oz (103.5 kg)  11/27/17 210 lb (95.3 kg)     Physical Exam Vitals signs and nursing note reviewed.  Constitutional:      General: He is not in acute distress.    Appearance: Normal appearance. He is obese.     Comments: Obese adult  male  HENT:     Head: Normocephalic and atraumatic.     Right Ear: Hearing and external ear normal.     Left Ear: Hearing and external ear normal.     Nose: Nose normal. No mucosal edema or rhinorrhea.     Right Turbinates: Not enlarged.     Left Turbinates: Not enlarged.     Mouth/Throat:     Mouth: Mucous membranes are dry.     Pharynx: Oropharynx is clear. No oropharyngeal exudate.     Comments: Mallampati 4 Eyes:     Pupils: Pupils are equal, round, and reactive to light.  Neck:     Musculoskeletal: Normal range of motion.  Cardiovascular:     Rate and Rhythm: Normal rate and regular rhythm.     Pulses: Normal pulses.  Heart sounds: Normal heart sounds. No murmur.  Pulmonary:     Effort: Pulmonary effort is normal.     Breath sounds: Normal breath sounds. No decreased breath sounds, wheezing or rales.  Abdominal:     General: Bowel sounds are normal. There is no distension.     Palpations: Abdomen is soft.     Tenderness: There is no abdominal tenderness.     Comments: Obese abdomen  Lymphadenopathy:     Cervical: No cervical adenopathy.  Skin:    General: Skin is warm and dry.     Capillary Refill: Capillary refill takes less than 2 seconds.     Findings: No erythema or rash.  Neurological:     General: No focal deficit present.     Mental Status: He is alert and oriented to person, place, and time.     Motor: No weakness.     Coordination: Coordination normal.     Gait: Gait is intact. Gait normal.  Psychiatric:        Mood and Affect: Mood normal.        Behavior: Behavior normal. Behavior is cooperative.        Thought Content: Thought content normal.        Judgment: Judgment normal.      Lab Results:  CBC    Component Value Date/Time   WBC 3.9 (L) 09/29/2017 1402   RBC 4.81 09/29/2017 1402   HGB 15.0 09/29/2017 1402   HGB 14.4 10/31/2014 1015   HCT 44.0 09/29/2017 1402   HCT 42.5 10/31/2014 1015   PLT 152 09/29/2017 1402   PLT 128 (L)  10/31/2014 1015   MCV 91.5 09/29/2017 1402   MCV 86.0 10/31/2014 1015   MCH 31.2 09/29/2017 1402   MCHC 34.1 09/29/2017 1402   RDW 12.8 09/29/2017 1402   RDW 14.7 (H) 10/31/2014 1015   LYMPHSABS 1.4 10/31/2014 1015   MONOABS 0.2 10/31/2014 1015   EOSABS 0.1 10/31/2014 1015   BASOSABS 0.0 10/31/2014 1015    BMET    Component Value Date/Time   NA 138 09/29/2017 1402   K 3.8 09/29/2017 1402   CL 103 09/29/2017 1402   CO2 27 09/29/2017 1402   GLUCOSE 123 (H) 09/29/2017 1402   BUN 12 09/29/2017 1402   CREATININE 0.91 09/29/2017 1402   CALCIUM 9.7 09/29/2017 1402   GFRNONAA >60 09/29/2017 1402   GFRAA >60 09/29/2017 1402    BNP No results found for: BNP  ProBNP    Component Value Date/Time   PROBNP 61.1 02/20/2013 0410      Assessment & Plan:   OSA (obstructive sleep apnea) Patient continues to struggle with CPAP use Patient barely able to tolerate pressure settings today, doubt patient will be able to tolerate optimal Pap pressure of 19 based off of split-night sleep study BMI 34.57  Plan: Referral to Dr. Corky Sing office for oral appliance May need to consider inspire device in the future if patient is able to reduce BMI, will try proceeding forward with oral appliance first Discussed need to increase daily physical activity work to reduce weight and BMI Also emphasized the importance of positional sleep, if patient is unable to tolerate CPAP therapy needs to make sure his head is elevated and he is not laying supine   Obesity with sleep apnea BMI 34.57  Plan: Work on reducing weight Work on diet and exercise May need to consider inspire device and referral to ENT in the future, recommended goal of reducing BMI  to below 30    Return in about 3 months (around 08/28/2019), or if symptoms worsen or fail to improve, for Follow up with Dr. Halford Chessman.   Lauraine Rinne, NP 05/28/2019   This appointment was 28 minutes long with over 50% of the time in direct  face-to-face patient care, assessment, plan of care, and follow-up.

## 2019-05-28 ENCOUNTER — Encounter: Payer: Self-pay | Admitting: Pulmonary Disease

## 2019-05-28 ENCOUNTER — Ambulatory Visit (INDEPENDENT_AMBULATORY_CARE_PROVIDER_SITE_OTHER): Payer: Medicare Other | Admitting: Pulmonary Disease

## 2019-05-28 ENCOUNTER — Other Ambulatory Visit: Payer: Self-pay

## 2019-05-28 VITALS — BP 126/82 | HR 60 | Temp 98.1°F | Ht 66.0 in | Wt 214.2 lb

## 2019-05-28 DIAGNOSIS — G4733 Obstructive sleep apnea (adult) (pediatric): Secondary | ICD-10-CM | POA: Diagnosis not present

## 2019-05-28 DIAGNOSIS — E669 Obesity, unspecified: Secondary | ICD-10-CM | POA: Diagnosis not present

## 2019-05-28 DIAGNOSIS — G4730 Sleep apnea, unspecified: Secondary | ICD-10-CM

## 2019-05-28 NOTE — Assessment & Plan Note (Signed)
BMI 34.57  Plan: Work on reducing weight Work on diet and exercise May need to consider inspire device and referral to ENT in the future, recommended goal of reducing BMI to below 30

## 2019-05-28 NOTE — Assessment & Plan Note (Signed)
Patient continues to struggle with CPAP use Patient barely able to tolerate pressure settings today, doubt patient will be able to tolerate optimal Pap pressure of 19 based off of split-night sleep study BMI 34.57  Plan: Referral to Dr. Corky Sing office for oral appliance May need to consider inspire device in the future if patient is able to reduce BMI, will try proceeding forward with oral appliance first Discussed need to increase daily physical activity work to reduce weight and BMI Also emphasized the importance of positional sleep, if patient is unable to tolerate CPAP therapy needs to make sure his head is elevated and he is not laying supine

## 2019-05-28 NOTE — Patient Instructions (Addendum)
We will refer you to Dr. Augustina Mood for your oral appliance  [address] [place], [place] [address] Phone: [phone] Website: [email or web address]  You will need to have six-month follow-up with Dr. Toy Cookey or your dentist to ensure your proper dentition and no issues with oral appliance.   We will also need to have you follow-up in 6 months with our office with a home sleep study to ensure that your sleep apnea is being treated adequately.    We recommend that you continue using your CPAP daily, until seeing Dr Toy Cookey  >>>Keep up the hard work using your device >>> Goal should be wearing this for the entire night that you are sleeping, at least 4 to 6 hours  Remember:  . Do not drive or operate heavy machinery if tired or drowsy.  . Please notify the supply company and office if you are unable to use your device regularly due to missing supplies or machine being broken.  . Work on maintaining a healthy weight and following your recommended nutrition plan  . Maintain proper daily exercise and movement  . Maintaining proper use of your device can also help improve management of other chronic illnesses such as: Blood pressure, blood sugars, and weight management.   BiPAP/ CPAP Cleaning:  >>>Clean weekly, with Dawn soap, and bottle brush.  Set up to air dry.     Return in about 3 months (around 08/28/2019), or if symptoms worsen or fail to improve, for Follow up with Dr. Halford Chessman.    Coronavirus (COVID-19) Are you at risk?  Are you at risk for the Coronavirus (COVID-19)?  To be considered HIGH RISK for Coronavirus (COVID-19), you have to meet the following criteria:  . Traveled to [place], [place], [place], [place] or [place]; or in the [place] to [place], [place], [place], or [place]; and have fever, cough, and shortness of breath within the last 2 weeks of travel OR . Been in close contact with a person diagnosed with COVID-19 within the last 2  weeks and have fever, cough, and shortness of breath . IF YOU DO NOT MEET THESE CRITERIA, YOU ARE CONSIDERED LOW RISK FOR COVID-19.  What to do if you are HIGH RISK for COVID-19?  Marland Kitchen If you are having a medical emergency, call 911. . Seek medical care right away. Before you go to a doctor's office, urgent care or emergency department, call ahead and tell them about your recent travel, contact with someone diagnosed with COVID-19, and your symptoms. You should receive instructions from your physician's office regarding next steps of care.  . When you arrive at healthcare provider, tell the healthcare staff immediately you have returned from visiting [place], [place], [place], [place] or [place]; or traveled in the [place] to [place], [place], [place], or [place]; in the last two weeks or you have been in close contact with a person diagnosed with COVID-19 in the last 2 weeks.   . Tell the health care staff about your symptoms: fever, cough and shortness of breath. . After you have been seen by a medical provider, you will be either: o Tested for (COVID-19) and discharged home on quarantine except to seek medical care if symptoms worsen, and asked to  - Stay home and avoid contact with others until you get your results (4-5 days)  - Avoid travel on public transportation if possible (such as bus, train, or airplane) or o Sent to the Emergency Department by  EMS for evaluation, COVID-19 testing, and possible admission depending on your condition and test results.  What to do if you are LOW RISK for COVID-19?  Reduce your risk of any infection by using the same precautions used for avoiding the common cold or flu:  Marland Kitchen Wash your hands often with soap and warm water for at least 20 seconds.  If soap and water are not readily available, use an alcohol-based hand sanitizer with at least 60% alcohol.  . If coughing or sneezing, cover your mouth and nose by coughing or sneezing into the elbow areas  of your shirt or coat, into a tissue or into your sleeve (not your hands). . Avoid shaking hands with others and consider head nods or verbal greetings only. . Avoid touching your eyes, nose, or mouth with unwashed hands.  . Avoid close contact with people who are sick. . Avoid places or events with large numbers of people in one location, like concerts or sporting events. . Carefully consider travel plans you have or are making. . If you are planning any travel outside or inside the [place], visit the CDC's Travelers' Health webpage for the latest health notices. . If you have some symptoms but not all symptoms, continue to monitor at home and seek medical attention if your symptoms worsen. . If you are having a medical emergency, call 911.   [place] / e-Visit: [email or web address]         [facility]: [facility]: [phone]                   [facility]: [phone]           It is flu season:   >>> Best ways to protect herself from the flu: Receive the yearly flu vaccine, practice good hand hygiene washing with soap and also using hand sanitizer when available, eat a nutritious meals, get adequate rest, hydrate appropriately   Please contact the office if your symptoms worsen or you have concerns that you are not improving.   Thank you for choosing [facility] for your healthcare, and for allowing [place] to partner with you on your healthcare journey. I am thankful to be able to provide care to you today.   Wyn Quaker FNP-C

## 2019-06-08 NOTE — Progress Notes (Signed)
Reviewed and agree with assessment/plan.   Joshia Kitchings, MD  Pulmonary/Critical Care 10/23/2016, 12:24 PM Pager:  [phone]  

## 2019-06-11 ENCOUNTER — Telehealth: Payer: Self-pay | Admitting: Pulmonary Disease

## 2019-06-11 NOTE — Telephone Encounter (Signed)
I spoke with pt, he states he never was contacted by Augustina Mood and was checking on the status. According to his chart he was referred on 7/31. I called Kerin Perna office but the office was closed. Will try to check the status on Monday.

## 2019-06-14 NOTE — Telephone Encounter (Signed)
Attempted to call patient, no answer. Left voicemail stating he should be hearing from Dr. Corky Sing office but to call us back if he doesn't.

## 2019-06-14 NOTE — Telephone Encounter (Signed)
Per Lovey Newcomer with Dr. Corky Sing office, they were not able to locate the referral paperwork for this patient.  I have re-faxed this referral to [phone] & rec'd fax confirm.  They will contact pt today to schedule an appt.

## 2019-06-14 NOTE — Telephone Encounter (Signed)
Spoke to Dr. Corky Sing office.  They will call me back with an update.

## 2019-06-14 NOTE — Telephone Encounter (Signed)
It seems like the referral was sent to Dr. Corky Sing office 7/31 by Earnest Bailey, [facility] and a confirmation was received by Dr. Corky Sing office.  Earnest Bailey, is there any way you can look into this for [place] as pt is still waiting for a call in regards to the referral.

## 2019-06-15 NOTE — Telephone Encounter (Signed)
LMTCB x2 for pt 

## 2019-06-16 NOTE — Telephone Encounter (Signed)
LMTCB x3 for pt. We have attempted to contact pt several times with no success or call back from pt. Per triage protocol, message will be closed.   

## 2019-06-19 NOTE — Progress Notes (Signed)
Reviewed and agree with assessment/plan.   Jocob Dambach, MD [facility] 10/23/2016, 12:24 PM Pager:  [phone]  

## 2019-10-07 ENCOUNTER — Other Ambulatory Visit: Payer: Self-pay | Admitting: Internal Medicine

## 2019-11-28 NOTE — Progress Notes (Signed)
_0  ID: Joseph Fisher, male    DOB: 10-07-1966, 54 y.o.   MRN: [id number]  Chief Complaint  Patient presents with  . Follow-up    6 month f/u for OSA. States he is not using his CPAP machine. Denies any breathing issues. Is able to sleep well at night.     Referring provider: Shanon Rosser, PA-C  HPI:  54 year old male never smoker followed in our office for obstructive sleep apnea  PMH: Anemia, insomnia Smoker/ Smoking History: Never smoker Maintenance: None Pt of: Dr. Halford Chessman  11/29/2019  - Visit   54 year old male never smoker followed in our office for severe obstructive sleep apnea.  Patient was last seen 6 months ago where he was referred to Dr. Corky Sing office for an oral appliance.  Patient felt that he was intolerant of using his CPAP.  Patient met with Dr. Toy Cookey and he reports that an oral appliance would cost around $3500.  He is working on saving up this money.  Patient presenting today as he is interested in trying a new CPAP mask to see if he is able to obtain better compliance with a different mask.  He is currently using a nasal mask.  He is interested in using a fullface.  We can coordinate this today.  Questionaires / Pulmonary Flowsheets:   Epworth:  Results of the Epworth flowsheet 05/28/2019 01/06/2019 12/09/2018  Sitting and reading 2 1 0  Watching TV _1 Sitting, inactive in a public place (e.g. a theatre or a meeting) 0 1 0  As a passenger in a car for an hour without a break _2 Lying down to rest in the afternoon when circumstances permit <BULAGTXMIWOEHOZY>_2<\/QMGNOIBBCWUGQBVQ>_9 Sitting and talking to someone 0 0 0  Sitting quietly after a lunch without alcohol 0 0 0  In a car, while stopped for a few minutes in traffic 0 0 0  Total score _4 Tests:   11/27/2017-split-night sleep study- severe obstructive sleep apnea AHI 69, optimal Pap pressure was selected for this patient at 19 cm of water, SaO2 low 73%, recommended mask: Medium size Resmed Full Face Mask AirFit F20  mask  HST 12/26/18 >> AHI 69.8, SpO2 low 54%  FENO:  No results found for: NITRICOXIDE  PFT: No flowsheet data found.  WALK:  No flowsheet data found.  Imaging: No results found.  Lab Results:  CBC    Component Value Date/Time   WBC 3.9 (L) 09/29/2017 1402   RBC 4.81 09/29/2017 1402   HGB 15.0 09/29/2017 1402   HGB 14.4 10/31/2014 1015   HCT 44.0 09/29/2017 1402   HCT 42.5 10/31/2014 1015   PLT 152 09/29/2017 1402   PLT 128 (L) 10/31/2014 1015   MCV 91.5 09/29/2017 1402   MCV 86.0 10/31/2014 1015   MCH 31.2 09/29/2017 1402   MCHC 34.1 09/29/2017 1402   RDW 12.8 09/29/2017 1402   RDW 14.7 (H) 10/31/2014 1015   LYMPHSABS 1.4 10/31/2014 1015   MONOABS 0.2 10/31/2014 1015   EOSABS 0.1 10/31/2014 1015   BASOSABS 0.0 10/31/2014 1015    BMET    Component Value Date/Time   NA 138 09/29/2017 1402   K 3.8 09/29/2017 1402   CL 103 09/29/2017 1402   CO2 27 09/29/2017 1402   GLUCOSE 123 (H) 09/29/2017 1402   BUN 12 09/29/2017 1402   CREATININE 0.91 09/29/2017 1402   CALCIUM 9.7 09/29/2017 1402   GFRNONAA >  60 09/29/2017 1402   GFRAA >60 09/29/2017 1402    BNP No results found for: BNP  ProBNP    Component Value Date/Time   PROBNP 61.1 02/20/2013 0410    Specialty Problems      Pulmonary Problems   Snoring   OSA (obstructive sleep apnea)    11/27/2017-split-night sleep study- severe obstructive sleep apnea AHI 69, optimal Pap pressure was selected for this patient at 19 cm of water, SaO2 low 73%, recommended mask: Medium size Resmed Full Face Mask AirFit F20 mask  HST 12/26/18 >> AHI 69.8, SpO2 low 54%       Obesity with sleep apnea      No Known Allergies  Immunization History  Administered Date(s) Administered  . Influenza Inj Mdck Quad With Preservative 12/16/2016, 09/04/2019  . Influenza,inj,Quad PF,6+ Mos 07/14/2014, 08/13/2018  . Influenza,inj,quad, With Preservative 09/02/2017  . Td 05/28/2014    Past Medical History:  Diagnosis Date  .  Anemia, unspecified 07/14/2014  . Barrett's esophagus   . Blood transfusion without reported diagnosis 05/2014  . Cervical stenosis of spine   . Chronic back pain   . Diverticulosis   . External hemorrhoids   . Gastric ulcer   . GERD (gastroesophageal reflux disease)   . GI bleed   . Hemorrhagic shock ([place])   . Hypertension   . Hypoxemia 06/20/2014  . Insomnia   . OSA (obstructive sleep apnea) 01/06/2019  . Peptic ulcer   . Pneumonia   . RLS (restless legs syndrome) 06/20/2014  . Snoring 06/20/2014  . Unspecified deficiency anemia 07/14/2014    Tobacco History: Social History   Tobacco Use  Smoking Status Never Smoker  Smokeless Tobacco Never Used   Counseling given: Yes  Continue to not smoke  Outpatient Encounter Medications as of 11/29/2019  Medication Sig  . Acetaminophen (TYLENOL PO) Take 3-4 tablets by mouth every 6 (six) hours as needed (depends on pain if takes 3-4 tablets).   Marland Kitchen amLODipine (NORVASC) 5 MG tablet   . azelastine (ASTELIN) 0.1 % nasal spray Place 2 sprays into both nostrils daily as needed for rhinitis or allergies.   . cyclobenzaprine (FLEXERIL) 10 MG tablet Take 1 tablet (10 mg total) by mouth 3 (three) times daily as needed for muscle spasms.  . nebivolol (BYSTOLIC) 10 MG tablet Take 1 tablet (10 mg total) by mouth every evening.  Marland Kitchen omeprazole (PRILOSEC) 40 MG capsule TAKE 1 CAPSULE BY MOUTH 2 TIMES DAILY BEFORE A MEAL.  Marland Kitchen oxyCODONE-acetaminophen (PERCOCET) 10-325 MG tablet Take 1 tablet by mouth 3 (three) times daily. From pain management.   . traZODone (DESYREL) 50 MG tablet Take 1 tablet (50 mg total) by mouth at bedtime.  . [DISCONTINUED] promethazine (PHENERGAN) 12.5 MG tablet Take 1 tablet (12.5 mg total) by mouth every 6 (six) hours as needed for nausea or vomiting.  . [DISCONTINUED] valsartan-hydrochlorothiazide (DIOVAN-HCT) 320-12.5 MG tablet Take 1 tablet by mouth every evening.    No facility-administered encounter medications on file as of  11/29/2019.     Review of Systems  Review of Systems  Constitutional: Negative for activity change, chills, fatigue, fever and unexpected weight change.  HENT: Negative for postnasal drip, rhinorrhea, sinus pressure, sinus pain and sore throat.   Eyes: Negative.   Respiratory: Negative for cough, shortness of breath and wheezing.   Cardiovascular: Negative for chest pain and palpitations.  Gastrointestinal: Negative for constipation, diarrhea, nausea and vomiting.  Endocrine: Negative.   Genitourinary: Negative.   Musculoskeletal: Negative.  Skin: Negative.   Neurological: Negative for dizziness and headaches.  Psychiatric/Behavioral: Negative.  Negative for dysphoric mood. The patient is not nervous/anxious.   All other systems reviewed and are negative.    Physical Exam  BP 126/80 (BP Location: Left Arm, Patient Position: Sitting, Cuff Size: Normal)   Pulse 75   Temp 97.6 F (36.4 C) (Temporal)   Ht _0  (1.676 m)   Wt 216 lb 9.6 oz (98.2 kg)   SpO2 97% Comment: on RA  BMI 34.96 kg/m   Wt Readings from Last 5 Encounters:  11/29/19 216 lb 9.6 oz (98.2 kg)  05/28/19 214 lb 3.2 oz (97.2 kg)  01/06/19 213 lb 12.8 oz (97 kg)  12/09/18 223 lb (101.2 kg)  09/30/18 228 lb 2 oz (103.5 kg)    BMI Readings from Last 5 Encounters:  11/29/19 34.96 kg/m  05/28/19 34.57 kg/m  01/06/19 34.51 kg/m  12/09/18 35.99 kg/m  09/30/18 36.82 kg/m     Physical Exam Vitals and nursing note reviewed.  Constitutional:      General: He is not in acute distress.    Appearance: Normal appearance. He is obese.  HENT:     Head: Normocephalic and atraumatic.     Right Ear: Hearing, tympanic membrane, ear canal and external ear normal. There is no impacted cerumen.     Left Ear: Hearing, tympanic membrane, ear canal and external ear normal. There is no impacted cerumen.     Nose: No mucosal edema.     Right Turbinates: Not enlarged.     Left Turbinates: Not enlarged.      Mouth/Throat:     Mouth: Mucous membranes are dry.     Pharynx: Oropharynx is clear. No oropharyngeal exudate.     Comments: Mallampati 4 Eyes:     Pupils: Pupils are equal, round, and reactive to light.  Cardiovascular:     Rate and Rhythm: Normal rate and regular rhythm.     Pulses: Normal pulses.     Heart sounds: Normal heart sounds. No murmur.  Pulmonary:     Effort: Pulmonary effort is normal.     Breath sounds: Normal breath sounds. No decreased breath sounds, wheezing or rales.  Musculoskeletal:     Cervical back: Normal range of motion.     Right lower leg: No edema.     Left lower leg: No edema.  Lymphadenopathy:     Cervical: No cervical adenopathy.  Skin:    General: Skin is warm and dry.     Capillary Refill: Capillary refill takes less than 2 seconds.     Findings: No erythema or rash.  Neurological:     General: No focal deficit present.     Mental Status: He is alert and oriented to person, place, and time.     Motor: No weakness.     Coordination: Coordination normal.     Gait: Gait is intact. Gait normal.  Psychiatric:        Mood and Affect: Mood normal.        Behavior: Behavior normal. Behavior is cooperative.        Thought Content: Thought content normal.        Judgment: Judgment normal.       Assessment & Plan:   Obesity with sleep apnea BMI 34.96  Plan: Work on reducing weight Work on diet and exercise May need to consider inspire device and referral to ENT in the future, recommended goal of reducing BMI to below 30  OSA (obstructive sleep apnea) Plan: Referral to DME company for mask fitting We will check compliance report in 1 month Can continue to work with Dr. Corky Sing office if you are able to financially afford an oral appliance Discussed positional sleeping: Recommended patient sleep laterally as well as raise head of the bed Discussed obtaining a wedge pillow Discussed need to increase physical activity and reduce BMI and  weight May be candidate for inspire device in the future if BMI is below 30    Return in about 3 months (around 02/26/2020), or if symptoms worsen or fail to improve, for Follow up with Dr. Halford Chessman, Follow up with Wyn Quaker FNP-C.   Lauraine Rinne, NP 11/29/2019   This appointment required 32 minutes of patient care (this includes precharting, chart review, review of results, face-to-face care, etc.).

## 2019-11-29 ENCOUNTER — Encounter: Payer: Self-pay | Admitting: Pulmonary Disease

## 2019-11-29 ENCOUNTER — Ambulatory Visit (INDEPENDENT_AMBULATORY_CARE_PROVIDER_SITE_OTHER): Payer: Medicare Other | Admitting: Pulmonary Disease

## 2019-11-29 ENCOUNTER — Other Ambulatory Visit: Payer: Self-pay

## 2019-11-29 VITALS — BP 126/80 | HR 75 | Temp 97.6°F | Ht 66.0 in | Wt 216.6 lb

## 2019-11-29 DIAGNOSIS — G4733 Obstructive sleep apnea (adult) (pediatric): Secondary | ICD-10-CM | POA: Diagnosis not present

## 2019-11-29 DIAGNOSIS — E669 Obesity, unspecified: Secondary | ICD-10-CM | POA: Diagnosis not present

## 2019-11-29 DIAGNOSIS — G4730 Sleep apnea, unspecified: Secondary | ICD-10-CM | POA: Diagnosis not present

## 2019-11-29 NOTE — Progress Notes (Signed)
Reviewed and agree with assessment/plan.   Lilianne Delair, MD [facility] 10/23/2016, 12:24 PM Pager:  [phone]  

## 2019-11-29 NOTE — Assessment & Plan Note (Signed)
BMI 34.96  Plan: Work on reducing weight Work on diet and exercise May need to consider inspire device and referral to ENT in the future, recommended goal of reducing BMI to below 30

## 2019-11-29 NOTE — Assessment & Plan Note (Signed)
Plan: Referral to DME company for mask fitting We will check compliance report in 1 month Can continue to work with Dr. Corky Sing office if you are able to financially afford an oral appliance Discussed positional sleeping: Recommended patient sleep laterally as well as raise head of the bed Discussed obtaining a wedge pillow Discussed need to increase physical activity and reduce BMI and weight May be candidate for inspire device in the future if BMI is below 30

## 2019-11-29 NOTE — Patient Instructions (Addendum)
You were seen today by Lauraine Rinne, NP  for:   1. OSA (obstructive sleep apnea)  We will place order for DME company to do a mask fitting   We recommend that you continue using your CPAP daily >>>Keep up the hard work using your device >>> Goal should be wearing this for the entire night that you are sleeping, at least 4 to 6 hours  Remember:  . Do not drive or operate heavy machinery if tired or drowsy.  . Please notify the supply company and office if you are unable to use your device regularly due to missing supplies or machine being broken.  . Work on maintaining a healthy weight and following your recommended nutrition plan  . Maintain proper daily exercise and movement  . Maintaining proper use of your device can also help improve management of other chronic illnesses such as: Blood pressure, blood sugars, and weight management.   BiPAP/ CPAP Cleaning:  >>>Clean weekly, with Dawn soap, and bottle brush.  Set up to air dry. >>> Wipe mask out daily with wet wipe or towelette   I believe you should try to exhaust all methods of using a CPAP as this would likely be the best way to treat and manage her sleep apnea if you are able to tolerate it.  If we continue to have issues with using CPAP then we can definitely proceed forward with Dr. Corky Sing team for an oral appliance if you are able to financially afford this  If not if BMI is below 30 could consider inspire device with Dr. Redmond Baseman   2. Obesity with sleep apnea  We can consider other interventions for your sleep apnea if your BMI gets below 30  Continue to work on losing weight and reducing your BMI    Follow Up:    Return in about 3 months (around 02/26/2020), or if symptoms worsen or fail to improve, for Follow up with Dr. Halford Chessman, Follow up with Wyn Quaker FNP-C.   Please do your part to reduce the spread of COVID-19:      Reduce your risk of any infection  and COVID19 by using the similar precautions used for  avoiding the common cold or flu:  Marland Kitchen Wash your hands often with soap and warm water for at least 20 seconds.  If soap and water are not readily available, use an alcohol-based hand sanitizer with at least 60% alcohol.  . If coughing or sneezing, cover your mouth and nose by coughing or sneezing into the elbow areas of your shirt or coat, into a tissue or into your sleeve (not your hands). Langley Gauss A MASK when in public  . Avoid shaking hands with others and consider head nods or verbal greetings only. . Avoid touching your eyes, nose, or mouth with unwashed hands.  . Avoid close contact with people who are sick. . Avoid places or events with large numbers of people in one location, like concerts or sporting events. . If you have some symptoms but not all symptoms, continue to monitor at home and seek medical attention if your symptoms worsen. . If you are having a medical emergency, call 911.   [place] / e-Visit: [email or web address]         [facility]: [facility]: [id number]                   [facility]:  [phone]     It is flu season:   >>> Best ways to protect herself from the flu: Receive the yearly flu vaccine, practice good hand hygiene washing with soap and also using hand sanitizer when available, eat a nutritious meals, get adequate rest, hydrate appropriately   Please contact the office if your symptoms worsen or you have concerns that you are not improving.   Thank you for choosing [facility] for your healthcare, and for allowing [place] to partner with you on your healthcare journey. I am thankful to be able to provide care to you today.   Wyn Quaker FNP-C

## 2019-12-09 ENCOUNTER — Telehealth: Payer: Self-pay | Admitting: Pulmonary Disease

## 2019-12-09 DIAGNOSIS — G4733 Obstructive sleep apnea (adult) (pediatric): Secondary | ICD-10-CM

## 2019-12-09 NOTE — Telephone Encounter (Signed)
Instructions from OV with Wyn Quaker 11/29/19    Return in about 3 months (around 02/26/2020), or if symptoms worsen or fail to improve, for Follow up with Dr. Halford Chessman, Follow up with Wyn Quaker FNP-C. You were seen today by Lauraine Rinne, NP  for:   1. OSA (obstructive sleep apnea)  We will place order for DME company to do a mask fitting   We recommend that you continue using your CPAP daily >>>Keep up the hard work using your device >>> Goal should be wearing this for the entire night that you are sleeping, at least 4 to 6 hours  Remember:   Do not drive or operate heavy machinery if tired or drowsy.   Please notify the supply company and office if you are unable to use your device regularly due to missing supplies or machine being broken.   Work on maintaining a healthy weight and following your recommended nutrition plan   Maintain proper daily exercise and movement   Maintaining proper use of your device can also help improve management of other chronic illnesses such as: Blood pressure, blood sugars, and weight management.   BiPAP/ CPAP Cleaning:  >>>Clean weekly, with Dawn soap, and bottle brush.  Set up to air dry. >>> Wipe mask out daily with wet wipe or towelette   I believe you should try to exhaust all methods of using a CPAP as this would likely be the best way to treat and manage her sleep apnea if you are able to tolerate it.  If we continue to have issues with using CPAP then we can definitely proceed forward with Dr. Corky Sing team for an oral appliance if you are able to financially afford this  If not if BMI is below 30 could consider inspire device with Dr. Redmond Baseman   2. Obesity with sleep apnea  We can consider other interventions for your sleep apnea if your BMI gets below 30  Continue to work on losing weight and reducing your BMI    Follow Up:    Return in about 3 months (around 02/26/2020), or if symptoms worsen or fail to improve, for  Follow up with Dr. Halford Chessman, Follow up with Wyn Quaker FNP-C.     Checked to see if there was an order placed for mask fitting after OV and no order was placed.  I have placed the order now for the mask fitting.  Called and spoke with pt letting him know this was done. Also provided pt with the number he could call for the sleep lab to see about when he might be able to get an appt scheduled to have the mask fit. Nothing further needed.

## 2019-12-10 ENCOUNTER — Telehealth: Payer: Self-pay | Admitting: Pulmonary Disease

## 2019-12-10 DIAGNOSIS — G4733 Obstructive sleep apnea (adult) (pediatric): Secondary | ICD-10-CM

## 2019-12-10 NOTE — Telephone Encounter (Signed)
Called the patient and made him aware of the response received from Brain Mack,NP. Patient voiced understanding. Order placed for full face mask.   Nothing further needed at this time.

## 2019-12-10 NOTE — Telephone Encounter (Signed)
Yes okay to send order for patient received mass that he is requesting.Wyn Quaker, FNP

## 2019-12-10 NOTE — Telephone Encounter (Signed)
Spoke with pt, he states he was called yesterday and he doesn't want to have a mask fitting. He knows what size he wears and just wants an order for med full face mask. From his description the place he got his mask from, it sounds like he gets his mask from [facility]. I looked in the previous order and the orders did go to [facility]. Can we send order for mask? Please advise.

## 2019-12-13 ENCOUNTER — Other Ambulatory Visit: Payer: Self-pay | Admitting: Internal Medicine

## 2019-12-29 ENCOUNTER — Telehealth: Payer: Self-pay | Admitting: Pulmonary Disease

## 2019-12-29 NOTE — Telephone Encounter (Signed)
Joseph Fisher,  This patient was last seen in clinic by you on 11/29/19 for OSA. The order was sent to [facility] on 12/10/19 for mask replacement.   They are not able to reach him and when the compliance was checked on airview it shows 0% for 90 days. The follow up notation in your note says he is not using the CPAP machine.  Can the order be discontinued?

## 2019-12-29 NOTE — Telephone Encounter (Signed)
Sure.  ° °Sheenah Dimitroff FNP

## 2019-12-30 NOTE — Telephone Encounter (Signed)
Called [facility] advised to cancel the order placed for the patient per Wyn Quaker, NP as the patient cannot be reached. Advised if there is anything that needs to be signed to fax to our office to his attention.  Nothing further needed.

## 2020-01-10 ENCOUNTER — Telehealth: Payer: Self-pay | Admitting: Pulmonary Disease

## 2020-01-10 DIAGNOSIS — G4733 Obstructive sleep apnea (adult) (pediatric): Secondary | ICD-10-CM

## 2020-01-10 NOTE — Telephone Encounter (Signed)
Left message for patient to call back  

## 2020-01-10 NOTE — Telephone Encounter (Signed)
Thank you for contacting the pt.   Wyn Quaker FNP

## 2020-01-10 NOTE — Telephone Encounter (Signed)
Patient states would like to discuss the CPAP pressure settings.  States pressure setting is set to as high as it can go.  Patient phone number is [phone].

## 2020-01-10 NOTE — Telephone Encounter (Signed)
Joseph Fisher,   There are multiple messages in this where the patient has not been able to be reached for mask fitting.  Has this been reviewed with the patient?  Is there a reason why he was unable to be contacted.  I can see that his pressure settings are within the range.  He still having significant number of events.  Please figure out why the patient was unable to be scheduled with his DME company for mask fitting.  Patient can also be scheduled for a CPAP titration in sleep lab.  This will help further evaluate with pressure settings he needs.  Ultimately, we need to be able to be in contact with the patient and he needs to be responsive when other people are calling him in order for [place] to move forward with management of his care.  Wyn Quaker FNP

## 2020-01-10 NOTE — Telephone Encounter (Signed)
Called and spoke to pt. Pt states he now has the mask from [facility]. Pt was vague when explaining why there was trouble with communication with [facility]. Pt would like to move forward with CPAP titration. Order placed. Will forward to Wyn Quaker, NP, as Juluis Rainier.   Will forward to PCC's to make aware. Pt aware he will need COVID test. Thanks.

## 2020-01-10 NOTE — Telephone Encounter (Signed)
Spoke with pt, he feels the pressure on his CPAP is too high. He is still having multiple events at night and wanted Aaron Edelman to check his download to see if he is on the right setting. I printed download and will give to Aaron Edelman for review. Please advise.

## 2020-01-10 NOTE — Telephone Encounter (Signed)
Joseph Fisher has placed order for cpap titration study.  I have left Sleep Lab a vm to call pt to schedule.

## 2020-01-11 NOTE — Telephone Encounter (Signed)
ATC patient.  LM for Patient to call back. 

## 2020-01-11 NOTE — Telephone Encounter (Signed)
Pt called back-- please return call.  

## 2020-01-11 NOTE — Telephone Encounter (Signed)
lmtcb for pt.  

## 2020-01-12 NOTE — Telephone Encounter (Signed)
Sleep Lab has also left vm for pt to call them to schedule titration study.

## 2020-01-12 NOTE — Telephone Encounter (Signed)
LMTCB x2 for pt 

## 2020-01-14 NOTE — Telephone Encounter (Signed)
Sleep Lab has scheduled titration study for 4/12.

## 2020-02-04 ENCOUNTER — Other Ambulatory Visit (HOSPITAL_COMMUNITY)
Admission: RE | Admit: 2020-02-04 | Discharge: 2020-02-04 | Disposition: A | Discharge status: Home / Self Care | Payer: Medicare Other | Source: Ambulatory Visit | Attending: Pulmonary Disease | Admitting: Pulmonary Disease

## 2020-02-04 DIAGNOSIS — Z01812 Encounter for preprocedural laboratory examination: Secondary | ICD-10-CM | POA: Insufficient documentation

## 2020-02-04 DIAGNOSIS — Z20822 Contact with and (suspected) exposure to covid-19: Secondary | ICD-10-CM | POA: Diagnosis not present

## 2020-02-04 LAB — SARS CORONAVIRUS 2 (TAT 6-24 HRS): SARS Coronavirus 2: NEGATIVE

## 2020-02-07 ENCOUNTER — Other Ambulatory Visit: Payer: Self-pay

## 2020-02-07 ENCOUNTER — Ambulatory Visit (HOSPITAL_BASED_OUTPATIENT_CLINIC_OR_DEPARTMENT_OTHER): Payer: Medicare Other | Attending: Pulmonary Disease | Admitting: Pulmonary Disease

## 2020-02-07 VITALS — Temp 98.0°F | Ht 66.0 in | Wt 220.0 lb

## 2020-02-07 DIAGNOSIS — G4733 Obstructive sleep apnea (adult) (pediatric): Secondary | ICD-10-CM | POA: Insufficient documentation

## 2020-02-07 DIAGNOSIS — G4739 Other sleep apnea: Secondary | ICD-10-CM

## 2020-02-14 ENCOUNTER — Telehealth: Payer: Self-pay | Admitting: Pulmonary Disease

## 2020-02-14 DIAGNOSIS — G4733 Obstructive sleep apnea (adult) (pediatric): Secondary | ICD-10-CM | POA: Diagnosis not present

## 2020-02-14 NOTE — Telephone Encounter (Signed)
Bipap titration 02/07/20 >> suboptimal titration.  Had continued events, mostly central.  Tried on CPAP and Bipap.    Please let him know that Titration study reviewed.  Please arrange for auto Bipap with max IPAP 25, min EPAP 10, and PS 4 cm H2O.  Please arrange for ROV with me in 2 months after getting auto Bipap.

## 2020-02-14 NOTE — Procedures (Signed)
    Patient Name: Joseph Fisher, Joseph Fisher Date: 02/07/2020 Gender: Male D.O.B: 1966-07-22 Age (years): 26 Referring Provider: Lauraine Rinne NP Height (inches): 49 Interpreting Physician: Chesley Mires MD, ABSM Weight (lbs): 220 RPSGT: Carolin Coy BMI: 36 MRN: [id number] Neck Size: 20.50  CLINICAL INFORMATION The patient is referred for a BiPAP titration to treat sleep apnea.  Date of HST 12/26/18:  AHI 69.8, SpO2 low 54%.  SLEEP STUDY TECHNIQUE As per the AASM Manual for the Scoring of Sleep and Associated Events v2.3 (April 2016) with a hypopnea requiring 4% desaturations.  The channels recorded and monitored were frontal, central and occipital EEG, electrooculogram (EOG), submentalis EMG (chin), nasal and oral airflow, thoracic and abdominal wall motion, anterior tibialis EMG, snore microphone, electrocardiogram, and pulse oximetry. Bilevel positive airway pressure (BPAP) was initiated at the beginning of the study and titrated to treat sleep-disordered breathing.  MEDICATIONS Medications self-administered by patient taken the night of the study : N/A  RESPIRATORY PARAMETERS He was started on CPAP 6 cm H2O and increased to 20 cm H2O.  He continued to have respiratory events, the majority of which were central events.  He was then changed to Bipap starting at 22/18 cm H2O and increased to 29/25 cm H2O.  He continued to have respiratory events, the majority of which were central events.  He was not tried on supplemental oxygen during this study.  SLEEP ARCHITECTURE Start Time: 11:04:23 PM Stop Time: 5:36:41 AM Total Time (min): 392.3 Total Sleep Time (min): 301 Sleep Latency (min): 6.4 Sleep Efficiency (%): 76.7% REM Latency (min): N/A WASO (min): 84.9 Stage N1 (%): 34.9% Stage N2 (%): 65.1% Stage N3 (%): 0.0% Stage R (%): 0 Supine (%): 100.00 Arousal Index (/hr): 39.5    CARDIAC DATA The 2 lead EKG demonstrated sinus rhythm. The mean heart rate was 53.1 beats per minute. Other  EKG findings include: None.  LEG MOVEMENT DATA The total Periodic Limb Movements of Sleep (PLMS) were 0. The PLMS index was 0.0. A PLMS index of <15 is considered normal in adults.  IMPRESSIONS - This was a sub-optimal titration study. - He continued to have central respiratory events with CPAP.  CPAP therefore failed to control his sleep apnea. - He was only tried on very high Bipap settings, and this was associated with central events.  DIAGNOSIS - Obstructive Sleep Apnea  - Treatment Emergent Central Sleep Apnea  RECOMMENDATIONS - Options are 1) to arrange for auto Bipap with Max IPAP 25, Min EPAP 10, and PS 4 cm H2O, or 2) arrange for repeat in lab titration study starting with Bipap 14/10 cm H2O and titrating up as needed. - He was fitted with a Medium size [employer] Respironics Full Face Mask Amara View mask and heated humidification.  [Electronically signed] 02/14/2020 08:53 AM  Chesley Mires MD, ABSM Diplomate, American Board of Sleep Medicine   NPI: [id number]

## 2020-02-17 NOTE — Telephone Encounter (Signed)
Spoke with patient. He is aware of results. He stated that he has been on a cpap machine for the past 6 months with [facility]. I advised him that once we placed the order for the bipap, depending on his current machine, they may switch out the cpap for a bipap. Also advised him that his current cpap might have the ability to be switched to a bipap.   He wants to know if the bipap is really necessary. He is concerned that insurance may not pay for it.   Dr. Halford Chessman, please advise. Thanks!

## 2020-02-17 NOTE — Telephone Encounter (Signed)
Called patient. Call went straight to VM. Left another message to call back.

## 2020-02-17 NOTE — Telephone Encounter (Signed)
Left message for patient to call back  

## 2020-02-17 NOTE — Telephone Encounter (Signed)
Pt called back, please return call anytime today 

## 2020-02-18 NOTE — Telephone Encounter (Signed)
Download has been printed and placed in your blue look-at folder.

## 2020-02-18 NOTE — Telephone Encounter (Signed)
Please get last 30 days CPAP download.  After reviewing this will then let him know if switching to Bipap is necessary.

## 2020-02-18 NOTE — Telephone Encounter (Signed)
Auto CPAP 01/17/20 to 02/15/20 >> used on 14 of 30 nights with average 4 hrs 41 min.  Average AHI 29.4 with median CPAP 13 and 95 th percentile CPAP 15 cm H2O.   Please let him know that CPAP download shows his sleep apnea is not at all controlled with CPAP.  He should switch to auto Bipap.

## 2020-02-28 ENCOUNTER — Encounter: Payer: Self-pay | Admitting: Pulmonary Disease

## 2020-02-28 ENCOUNTER — Ambulatory Visit (INDEPENDENT_AMBULATORY_CARE_PROVIDER_SITE_OTHER): Payer: Medicare Other | Admitting: Pulmonary Disease

## 2020-02-28 ENCOUNTER — Other Ambulatory Visit: Payer: Self-pay

## 2020-02-28 VITALS — BP 120/82 | HR 74 | Temp 97.5°F | Ht 66.0 in | Wt 224.6 lb

## 2020-02-28 DIAGNOSIS — G4733 Obstructive sleep apnea (adult) (pediatric): Secondary | ICD-10-CM

## 2020-02-28 NOTE — Patient Instructions (Addendum)
You were seen today by Lauraine Rinne, NP  for:   1. OSA (obstructive sleep apnea)  - Ambulatory Referral for DME  We recommend that you start using your BIPAP daily >>>Keep up the hard work using your device >>> Goal should be wearing this for the entire night that you are sleeping, at least 4 to 6 hours  Remember:  . Do not drive or operate heavy machinery if tired or drowsy.  . Please notify the supply company and office if you are unable to use your device regularly due to missing supplies or machine being broken.  . Work on maintaining a healthy weight and following your recommended nutrition plan  . Maintain proper daily exercise and movement  . Maintaining proper use of your device can also help improve management of other chronic illnesses such as: Blood pressure, blood sugars, and weight management.   BiPAP/ CPAP Cleaning:  >>>Clean weekly, with Dawn soap, and bottle brush.  Set up to air dry. >>> Wipe mask out daily with wet wipe or towelette    We recommend today:  Orders Placed This Encounter  Procedures  . Ambulatory Referral for DME    Referral Priority:   Routine    Referral Type:   Durable Medical Equipment Purchase    Number of Visits Requested:   1   Orders Placed This Encounter  Procedures  . Ambulatory Referral for DME   No orders of the defined types were placed in this encounter.   Follow Up:    Return in about 2 months (around 04/29/2020), or if symptoms worsen or fail to improve, for Follow up with Dr. Halford Chessman.  If nothing available at the 69-month mark with Dr. Halford Chessman please schedule for next available closest around that timeframe.  Please do your part to reduce the spread of COVID-19:      Reduce your risk of any infection  and COVID19 by using the similar precautions used for avoiding the common cold or flu:  Marland Kitchen Wash your hands often with soap and warm water for at least 20 seconds.  If soap and water are not readily available, use an alcohol-based  hand sanitizer with at least 60% alcohol.  . If coughing or sneezing, cover your mouth and nose by coughing or sneezing into the elbow areas of your shirt or coat, into a tissue or into your sleeve (not your hands). Langley Gauss A MASK when in public  . Avoid shaking hands with others and consider head nods or verbal greetings only. . Avoid touching your eyes, nose, or mouth with unwashed hands.  . Avoid close contact with people who are sick. . Avoid places or events with large numbers of people in one location, like concerts or sporting events. . If you have some symptoms but not all symptoms, continue to monitor at home and seek medical attention if your symptoms worsen. . If you are having a medical emergency, call 911.   [place] / e-Visit: [email or web address]         [facility]: [facility]: [id number]                   [facility]: [id number]     It is flu season:   >>> Best ways to protect herself from the flu: Receive the yearly flu vaccine, practice good hand hygiene washing with soap and also using hand sanitizer when available,  eat a nutritious meals, get adequate rest, hydrate appropriately   Please contact the office if your symptoms worsen or you have concerns that you are not improving.   Thank you for choosing [facility] for your healthcare, and for allowing [place] to partner with you on your healthcare journey. I am thankful to be able to provide care to you today.   Wyn Quaker FNP-C

## 2020-02-28 NOTE — Progress Notes (Signed)
@Patient  ID: Joseph Fisher, male    DOB: May 23, 1966, 54 y.o.   MRN: [id number]  Chief Complaint  Patient presents with  . Follow-up    discuss cpap vs bipap    Referring provider: Shanon Rosser, PA-C  HPI:  54 year old male never smoker followed in our office for obstructive sleep apnea  PMH: Anemia, insomnia Smoker/ Smoking History: Never smoker Maintenance: None Pt of: Dr. Halford Chessman  02/28/2020  - Visit   54 year old male never smoker followed in our office for severe obstructive sleep apnea.  Patient is currently maintained on CPAP therapy.  Unfortunately the CPAP therapy is not providing adequate control over his apneas.  April/2021 telephone note from Dr. Halford Chessman is recommending transitioning patient to BiPAP based off of poor control of apneas.  Patient is here to discuss his results.  Questionaires / Pulmonary Flowsheets:   Epworth:  Results of the Epworth flowsheet 05/28/2019 01/06/2019 12/09/2018  Sitting and reading 2 1 0  Watching TV 2 1 2   Sitting, inactive in a public place (e.g. a theatre or a meeting) 0 1 0  As a passenger in a car for an hour without a break 2 1 1   Lying down to rest in the afternoon when circumstances permit 2 1 1   Sitting and talking to someone 0 0 0  Sitting quietly after a lunch without alcohol 0 0 0  In a car, while stopped for a few minutes in traffic 0 0 0  Total score 8 5 4     Tests:   11/27/2017-split-night sleep study- severe obstructive sleep apnea AHI 69, optimal Pap pressure was selected for this patient at 19 cm of water, SaO2 low 73%, recommended mask: Medium size Resmed Full Face Mask AirFit F20 mask  HST 12/26/18 >> AHI 69.8, SpO2 low 54%  Bipap titration 02/07/20 >> suboptimal titration.  Had continued events, mostly central.  Tried on CPAP and Bipap. >>> Recommendations: Please let him know that Titration study reviewed.  Please arrange for auto Bipap with max IPAP 25, min EPAP 10, and PS 4 cm H2O.  Please arrange for ROV with me  in 2 months after getting auto Bipap.   FENO:  No results found for: NITRICOXIDE  PFT: No flowsheet data found.  WALK:  No flowsheet data found.  Imaging: SLEEP STUDY DOCUMENTS  Result Date: 02/09/2020 Ordered by an unspecified provider.   Lab Results:  CBC    Component Value Date/Time   WBC 3.9 (L) 09/29/2017 1402   RBC 4.81 09/29/2017 1402   HGB 15.0 09/29/2017 1402   HGB 14.4 10/31/2014 1015   HCT 44.0 09/29/2017 1402   HCT 42.5 10/31/2014 1015   PLT 152 09/29/2017 1402   PLT 128 (L) 10/31/2014 1015   MCV 91.5 09/29/2017 1402   MCV 86.0 10/31/2014 1015   MCH 31.2 09/29/2017 1402   MCHC 34.1 09/29/2017 1402   RDW 12.8 09/29/2017 1402   RDW 14.7 (H) 10/31/2014 1015   LYMPHSABS 1.4 10/31/2014 1015   MONOABS 0.2 10/31/2014 1015   EOSABS 0.1 10/31/2014 1015   BASOSABS 0.0 10/31/2014 1015    BMET    Component Value Date/Time   NA 138 09/29/2017 1402   K 3.8 09/29/2017 1402   CL 103 09/29/2017 1402   CO2 27 09/29/2017 1402   GLUCOSE 123 (H) 09/29/2017 1402   BUN 12 09/29/2017 1402   CREATININE 0.91 09/29/2017 1402   CALCIUM 9.7 09/29/2017 1402   GFRNONAA >60 09/29/2017 1402   GFRAA >60  09/29/2017 1402    BNP No results found for: BNP  ProBNP    Component Value Date/Time   PROBNP 61.1 02/20/2013 0410    Specialty Problems      Pulmonary Problems   Snoring   OSA (obstructive sleep apnea)    11/27/2017-split-night sleep study- severe obstructive sleep apnea AHI 69, optimal Pap pressure was selected for this patient at 19 cm of water, SaO2 low 73%, recommended mask: Medium size Resmed Full Face Mask AirFit F20 mask  HST 12/26/18 >> AHI 69.8, SpO2 low 54%  Bipap titration 02/07/20 >> suboptimal titration.  Had continued events, mostly central.  Tried on CPAP and Bipap. >>> Recommendations: Please let him know that Titration study reviewed.  Please arrange for auto Bipap with max IPAP 25, min EPAP 10, and PS 4 cm H2O.  Please arrange for ROV with me in  2 months after getting auto Bipap.        Obesity with sleep apnea      No Known Allergies  Immunization History  Administered Date(s) Administered  . Influenza Inj Mdck Quad With Preservative 12/16/2016, 09/04/2019  . Influenza,inj,Quad PF,6+ Mos 07/14/2014, 08/13/2018  . Influenza,inj,quad, With Preservative 09/02/2017  . Td 05/28/2014    Past Medical History:  Diagnosis Date  . Anemia, unspecified 07/14/2014  . Barrett's esophagus   . Blood transfusion without reported diagnosis 05/2014  . Cervical stenosis of spine   . Chronic back pain   . Diverticulosis   . External hemorrhoids   . Gastric ulcer   . GERD (gastroesophageal reflux disease)   . GI bleed   . Hemorrhagic shock ([place])   . Hypertension   . Hypoxemia 06/20/2014  . Insomnia   . OSA (obstructive sleep apnea) 01/06/2019  . Peptic ulcer   . Pneumonia   . RLS (restless legs syndrome) 06/20/2014  . Snoring 06/20/2014  . Unspecified deficiency anemia 07/14/2014    Tobacco History: Social History   Tobacco Use  Smoking Status Never Smoker  Smokeless Tobacco Never Used   Counseling given: Not Answered   Continue to not smoke  Outpatient Encounter Medications as of 02/28/2020  Medication Sig  . Acetaminophen (TYLENOL PO) Take 3-4 tablets by mouth every 6 (six) hours as needed (depends on pain if takes 3-4 tablets).   Marland Kitchen amLODipine (NORVASC) 5 MG tablet   . azelastine (ASTELIN) 0.1 % nasal spray Place 2 sprays into both nostrils daily as needed for rhinitis or allergies.   . cyclobenzaprine (FLEXERIL) 10 MG tablet Take 1 tablet (10 mg total) by mouth 3 (three) times daily as needed for muscle spasms.  . nebivolol (BYSTOLIC) 10 MG tablet Take 1 tablet (10 mg total) by mouth every evening.  Marland Kitchen omeprazole (PRILOSEC) 40 MG capsule TAKE 1 CAPSULE BY MOUTH 2 TIMES DAILY BEFORE A MEAL. NEEDS OFFICE VISIT FOR FURTHER REFILLS  . oxyCODONE-acetaminophen (PERCOCET) 10-325 MG tablet Take 1 tablet by mouth 3 (three) times  daily. From pain management.   . traZODone (DESYREL) 50 MG tablet Take 1 tablet (50 mg total) by mouth at bedtime.   No facility-administered encounter medications on file as of 02/28/2020.     Review of Systems  Review of Systems  Constitutional: Negative for activity change, chills, fatigue, fever and unexpected weight change.  HENT: Negative for congestion, postnasal drip, rhinorrhea, sinus pressure, sinus pain and sore throat.   Eyes: Negative.   Respiratory: Negative for cough, shortness of breath and wheezing.   Cardiovascular: Negative for chest pain and palpitations.  Gastrointestinal: Negative for diarrhea, nausea and vomiting.  Endocrine: Negative.   Genitourinary: Negative.   Musculoskeletal: Negative.   Skin: Negative.   Neurological: Negative for dizziness and headaches.  Psychiatric/Behavioral: Negative.  Negative for dysphoric mood. The patient is not nervous/anxious.   All other systems reviewed and are negative.    Physical Exam  BP 120/82   Pulse 74   Temp (!) 97.5 F (36.4 C) (Temporal)   Ht 5\' 6"  (1.676 m)   Wt 224 lb 9.6 oz (101.9 kg)   SpO2 96% Comment: on RA  BMI 36.25 kg/m   Wt Readings from Last 5 Encounters:  02/28/20 224 lb 9.6 oz (101.9 kg)  02/07/20 220 lb (99.8 kg)  11/29/19 216 lb 9.6 oz (98.2 kg)  05/28/19 214 lb 3.2 oz (97.2 kg)  01/06/19 213 lb 12.8 oz (97 kg)    BMI Readings from Last 5 Encounters:  02/28/20 36.25 kg/m  02/07/20 35.51 kg/m  11/29/19 34.96 kg/m  05/28/19 34.57 kg/m  01/06/19 34.51 kg/m     Physical Exam Vitals and nursing note reviewed.  Constitutional:      General: He is not in acute distress.    Appearance: Normal appearance. He is obese.  HENT:     Head: Normocephalic and atraumatic.     Right Ear: Hearing and external ear normal.     Left Ear: Hearing and external ear normal.     Nose: Nose normal. No mucosal edema or rhinorrhea.     Right Turbinates: Not enlarged.     Left Turbinates: Not  enlarged.     Mouth/Throat:     Mouth: Mucous membranes are dry.     Pharynx: Oropharynx is clear. No oropharyngeal exudate.  Eyes:     Pupils: Pupils are equal, round, and reactive to light.  Cardiovascular:     Rate and Rhythm: Normal rate and regular rhythm.     Pulses: Normal pulses.     Heart sounds: Normal heart sounds. No murmur.  Pulmonary:     Effort: Pulmonary effort is normal.     Breath sounds: No decreased breath sounds, wheezing or rales.  Musculoskeletal:     Cervical back: Normal range of motion.     Right lower leg: No edema.     Left lower leg: No edema.  Lymphadenopathy:     Cervical: No cervical adenopathy.  Skin:    General: Skin is warm and dry.     Capillary Refill: Capillary refill takes less than 2 seconds.     Findings: No erythema or rash.  Neurological:     General: No focal deficit present.     Mental Status: He is alert and oriented to person, place, and time.     Motor: No weakness.     Gait: Gait is intact. Gait normal.  Psychiatric:        Mood and Affect: Mood normal.        Behavior: Behavior normal. Behavior is cooperative.        Thought Content: Thought content normal.        Judgment: Judgment normal.       Assessment & Plan:   OSA (obstructive sleep apnea) Suboptimal control on CPAP Unable to tolerate high pressures of CPAP therapy Recent CPAP compliance report reviewed still showing AHI of 30 despite treatment BiPAP titration reviewed, recommendations by Dr. Halford Chessman discussed with patient  Plan: Start BiPAP 31-month follow-up with Dr. Halford Chessman    Return in about 2 months (around 04/29/2020),  or if symptoms worsen or fail to improve, for Follow up with Dr. Halford Chessman.   Lauraine Rinne, NP 02/28/2020   This appointment required 25 minutes of patient care (this includes precharting, chart review, review of results, face-to-face care, etc.).

## 2020-02-28 NOTE — Assessment & Plan Note (Addendum)
Suboptimal control on CPAP Unable to tolerate high pressures of CPAP therapy Recent CPAP compliance report reviewed still showing AHI of 30 despite treatment BiPAP titration reviewed, recommendations by Dr. Halford Chessman discussed with patient  Plan: Start BiPAP 5-month follow-up with Dr. Halford Chessman

## 2020-02-29 NOTE — Progress Notes (Signed)
Reviewed and agree with assessment/plan.   Jerusalem Wert, MD [facility] 10/23/2016, 12:24 PM Pager:  [phone]  

## 2020-07-27 ENCOUNTER — Other Ambulatory Visit: Payer: Self-pay | Admitting: Internal Medicine

## 2021-01-26 ENCOUNTER — Other Ambulatory Visit: Payer: Self-pay

## 2021-01-26 ENCOUNTER — Encounter: Payer: Self-pay | Admitting: Cardiology

## 2021-01-26 ENCOUNTER — Ambulatory Visit: Payer: Medicare Other | Admitting: Cardiology

## 2021-01-26 VITALS — BP 134/85 | HR 64 | Temp 97.7°F | Resp 16 | Ht 66.0 in | Wt 231.0 lb

## 2021-01-26 DIAGNOSIS — I10 Essential (primary) hypertension: Secondary | ICD-10-CM

## 2021-01-26 DIAGNOSIS — Z6837 Body mass index (BMI) 37.0-37.9, adult: Secondary | ICD-10-CM

## 2021-01-26 DIAGNOSIS — R0609 Other forms of dyspnea: Secondary | ICD-10-CM

## 2021-01-26 DIAGNOSIS — R0600 Dyspnea, unspecified: Secondary | ICD-10-CM

## 2021-01-26 DIAGNOSIS — E66812 Obesity, class 2: Secondary | ICD-10-CM

## 2021-01-26 DIAGNOSIS — G4733 Obstructive sleep apnea (adult) (pediatric): Secondary | ICD-10-CM

## 2021-01-26 DIAGNOSIS — E782 Mixed hyperlipidemia: Secondary | ICD-10-CM

## 2021-01-26 DIAGNOSIS — I209 Angina pectoris, unspecified: Secondary | ICD-10-CM

## 2021-01-26 MED ORDER — NITROGLYCERIN 0.4 MG SL SUBL: 0.4000 mg | SUBLINGUAL_TABLET | SUBLINGUAL | 0 refills | Status: DC | PRN

## 2021-01-26 MED ORDER — RANOLAZINE ER 500 MG PO TB12: 500.0000 mg | ORAL_TABLET | Freq: Two times a day (BID) | ORAL | 0 refills | Status: DC

## 2021-01-26 MED ORDER — ASPIRIN EC 81 MG PO TBEC
81.0000 mg | DELAYED_RELEASE_TABLET | Freq: Every day | ORAL | Status: AC
Start: 2021-01-26 — End: 2021-04-26

## 2021-01-26 NOTE — Progress Notes (Signed)
Date:  01/26/2021   ID:  Joseph Fisher, DOB 02-20-1966, MRN [id number]  PCP:  Joseph Rosser, PA-C  Cardiologist:  Joseph Kras, DO, [facility] (established care 01/26/2021)  REASON FOR CONSULT: Chest pain  REQUESTING PHYSICIAN:  Joseph Rosser, PA-C [place],  [place] [id number]  Chief Complaint  Patient presents with  .  Other forms of angina pectoris  . New Patient (Initial Visit)    HPI  Joseph Fisher is a 55 y.o. male who presents to the office with a chief complaint of " chest pain." Patient's past medical history and cardiovascular risk factors include: Hypertension, OSA not on CPAP, hyperlipidemia, obesity due to excess calories.  He is referred to the office at the request of Long, Nicki Reaper, PA-C for evaluation of chest pain.  Patient is referred to the office for evaluation of chest discomfort.  He states that the discomfort started approximately 3 weeks ago while he was walking his 2 miles on a regular basis.  When the discomfort occurred he sat down and the symptoms resolved.  Since then he continues to have effort related chest discomfort located on his left anterior chest, intensity 7 out of 10, tightness-like sensation, brought on by effort related activities, resolves with rest, radiates to the left arm.  No active chest pain at the time of the evaluation.  His last discomfort was this past Tuesday while he was exercising.  Patient states that he usually walks 2 miles per day but at times now he notices chest discomfort by a quarter of a mile.  When asked why he has not seek medical attention over the last 3 weeks he does not have a clear explanation.  No family history of premature coronary disease or sudden cardiac death.  FUNCTIONAL STATUS: He exercises on a daily basis for approximately 60 minutes.  He tries to walk at least 2 miles per day and also some resistance training at the gym.  ALLERGIES: No Known Allergies  MEDICATION LIST PRIOR TO  VISIT: Current Meds  Medication Sig  . acetaminophen (TYLENOL) 325 MG tablet Take 650 mg by mouth every 6 (six) hours as needed for moderate pain.  Marland Kitchen amitriptyline (ELAVIL) 50 MG tablet Take 50 mg by mouth at bedtime.  Marland Kitchen amLODipine (NORVASC) 5 MG tablet Take 5 mg by mouth daily.  Marland Kitchen aspirin EC 81 MG tablet Take 1 tablet (81 mg total) by mouth daily. Swallow whole.  . nebivolol (BYSTOLIC) 10 MG tablet Take 1 tablet (10 mg total) by mouth every evening.  . nitroGLYCERIN (NITROSTAT) 0.4 MG SL tablet Place 1 tablet (0.4 mg total) under the tongue every 5 (five) minutes as needed for chest pain. If you require more than two tablets five minutes apart go to the nearest ER via EMS.  Marland Kitchen omeprazole (PRILOSEC) 40 MG capsule TAKE 1 CAPSULE BY MOUTH 2 TIMES DAILY BEFORE A MEAL. NEEDS OFFICE VISIT FOR FURTHER REFILLS (Patient taking differently: Take 40 mg by mouth in the morning and at bedtime.)  . oxyCODONE-acetaminophen (PERCOCET) 10-325 MG tablet Take 1 tablet by mouth every 6 (six) hours as needed for pain.  . ranolazine (RANEXA) 500 MG 12 hr tablet Take 1 tablet (500 mg total) by mouth 2 (two) times daily.  . rosuvastatin (CRESTOR) 10 MG tablet Take 10 mg by mouth at bedtime.  . valsartan-hydrochlorothiazide (DIOVAN-HCT) 320-12.5 MG tablet Take 1 tablet by mouth daily.  . [DISCONTINUED] azelastine (ASTELIN) 0.1 % nasal spray Place 2 sprays into both nostrils  daily as needed for rhinitis or allergies.  (Patient not taking: Reported on 01/26/2021)     PAST MEDICAL HISTORY: Past Medical History:  Diagnosis Date  . Anemia, unspecified 07/14/2014  . Barrett's esophagus   . Blood transfusion without reported diagnosis 05/2014  . Cervical stenosis of spine   . Chronic back pain   . Diverticulosis   . External hemorrhoids   . Gastric ulcer   . GERD (gastroesophageal reflux disease)   . GI bleed   . Hemorrhagic shock ([place])   . Hyperlipidemia   . Hypertension   . Hypoxemia 06/20/2014  . Insomnia   . OSA  (obstructive sleep apnea) 01/06/2019  . Peptic ulcer   . Pneumonia   . RLS (restless legs syndrome) 06/20/2014  . Snoring 06/20/2014  . Unspecified deficiency anemia 07/14/2014    PAST SURGICAL HISTORY: Past Surgical History:  Procedure Laterality Date  . ANTERIOR CERVICAL DECOMP/DISCECTOMY FUSION N/A 10/03/2017   Procedure: ACDF - C4-C5 - C5-C6 - C6-C7;  Surgeon: Earnie Larsson, MD;  Location: [facility];  Service: Neurosurgery;  Laterality: N/A;  . BACK SURGERY  2013  . ESOPHAGOGASTRODUODENOSCOPY N/A 06/04/2014   Procedure: ESOPHAGOGASTRODUODENOSCOPY (EGD);  Surgeon: Jerene Bears, MD;  Location: [facility] ENDOSCOPY;  Service: Endoscopy;  Laterality: N/A;    FAMILY HISTORY: The patient family history includes Hypertension in his father and mother.  SOCIAL HISTORY:  The patient  reports that he has never smoked. He has never used smokeless tobacco. He reports previous alcohol use. He reports that he does not use drugs.  REVIEW OF SYSTEMS: Review of Systems  Constitutional: Positive for malaise/fatigue. Negative for chills and fever.  HENT: Negative for hoarse voice and nosebleeds.   Eyes: Negative for discharge, double vision and pain.  Cardiovascular: Positive for chest pain and dyspnea on exertion. Negative for claudication, leg swelling, near-syncope, orthopnea, palpitations, paroxysmal nocturnal dyspnea and syncope.  Respiratory: Positive for shortness of breath. Negative for hemoptysis.   Musculoskeletal: Negative for muscle cramps and myalgias.  Gastrointestinal: Negative for abdominal pain, constipation, diarrhea, hematemesis, hematochezia, melena, nausea and vomiting.  Neurological: Negative for dizziness and light-headedness.    PHYSICAL EXAM: Vitals with BMI 01/26/2021 02/28/2020 02/07/2020  Height '5\' 6"'  '5\' 6"'  '5\' 6"'   Weight 231 lbs 224 lbs 10 oz 220 lbs  BMI 37.3 98.92 11.94  Systolic 174 081 -  Diastolic 85 82 -  Pulse 64 74 -   CONSTITUTIONAL: Well-developed and well-nourished. No acute  distress.  SKIN: Skin is warm and dry. No rash noted. No cyanosis. No pallor. No jaundice HEAD: Normocephalic and atraumatic.  EYES: No scleral icterus MOUTH/THROAT: Moist oral membranes.  NECK: No JVD present. No thyromegaly noted. No carotid bruits  LYMPHATIC: No visible cervical adenopathy.  CHEST Normal respiratory effort. No intercostal retractions  LUNGS: Clear to auscultation bilaterally.  No stridor. No wheezes. No rales.  CARDIOVASCULAR: Regular rate and rhythm, positive S1-S2, no murmurs rubs or gallops appreciated ABDOMINAL: Obese, soft, nontender, nondistended, positive bowel sounds in all 4 quadrants no apparent ascites.  EXTREMITIES: No peripheral edema  HEMATOLOGIC: No significant bruising NEUROLOGIC: Oriented to person, place, and time. Nonfocal. Normal muscle tone.  PSYCHIATRIC: Normal mood and affect. Normal behavior. Cooperative  CARDIAC DATABASE: EKG: 01/26/2021: Sinus  Rhythm, 67 bpm, T wave inversions in the inferior leads suggestive of possible ischemia, without underlying injury pattern.  Echocardiogram: No results found for this or any previous visit from the past 1095 days.   Stress Testing: No results found for this or  any previous visit from the past 1095 days.   Heart Catheterization: None  LABORATORY DATA: CBC Latest Ref Rng & Units 09/29/2017 10/31/2014 10/09/2014  WBC 4.0 - 10.5 K/uL 3.9(L) 3.4(L) 5.3  Hemoglobin 13.0 - 17.0 g/dL 15.0 14.4 16.3  Hematocrit 39.0 - 52.0 % 44.0 42.5 46.9  Platelets 150 - 400 K/uL 152 128(L) 153    CMP Latest Ref Rng & Units 09/29/2017 10/09/2014 06/07/2014  Glucose 65 - 99 mg/dL 123(H) 108(H) 144(H)  BUN 6 - 20 mg/dL '12 11 9  ' Creatinine 0.61 - 1.24 mg/dL 0.91 0.91 0.75  Sodium 135 - 145 mmol/L 138 137 138  Potassium 3.5 - 5.1 mmol/L 3.8 4.3 3.2(L)  Chloride 101 - 111 mmol/L 103 96 100  CO2 22 - 32 mmol/L '27 23 24  ' Calcium 8.9 - 10.3 mg/dL 9.7 10.2 8.6  Total Protein 6.0 - 8.3 g/dL - 8.4(H) -  Total Bilirubin 0.3  - 1.2 mg/dL - 0.8 -  Alkaline Phos 39 - 117 U/L - 47 -  AST 0 - 37 U/L - 18 -  ALT 0 - 53 U/L - 19 -    Lipid Panel  No results found for: CHOL, TRIG, HDL, CHOLHDL, VLDL, LDLCALC, LDLDIRECT, LABVLDL  No components found for: NTPROBNP  No results for input(s): PROBNP in the last 8760 hours. No results for input(s): TSH in the last 8760 hours.  BMP No results for input(s): NA, K, CL, CO2, GLUCOSE, BUN, CREATININE, CALCIUM, GFRNONAA, GFRAA in the last 8760 hours.  HEMOGLOBIN A1C No results found for: HGBA1C, MPG  External Labs: Collected: 01/13/2021, provided by PCP Hemoglobin 14.1 g/dL, hematocrit 41.1% Platelets 147 Creatinine 0.84 mg/dL. eGFR: 99 mL/min per 1.73 m Potassium 3.8 AST 21, ALT 27, alkaline phosphatase 53 Hemoglobin A1c: 6.1  IMPRESSION:    ICD-10-CM   1. Angina pectoris (HCC)  I20.9 EKG 12-Lead    aspirin EC 81 MG tablet    nitroGLYCERIN (NITROSTAT) 0.4 MG SL tablet    ranolazine (RANEXA) 500 MG 12 hr tablet    PCV ECHOCARDIOGRAM COMPLETE    Basic metabolic panel    Magnesium    Pro b natriuretic peptide (BNP)    SARS-COV-2 RNA,(COVID-19) QUAL NAAT  2. Dyspnea on exertion  R06.00 PCV ECHOCARDIOGRAM COMPLETE  3. Benign essential hypertension  I10   4. Mixed hyperlipidemia  E78.2   5. OSA (obstructive sleep apnea)  G47.33   6. Class 2 severe obesity due to excess calories with serious comorbidity and body mass index (BMI) of 37.0 to 37.9 in adult [facility])  E66.01    Z68.37      RECOMMENDATIONS: Joseph Fisher is a 55 y.o. male whose past medical history and cardiac risk factors include:  Hypertension, OSA not on CPAP, hyperlipidemia, obesity due to excess calories.  Angina pectoris:  Patient does not have any active chest pain.  EKG does not show any underlying injury pattern.  Due to high pretest probability of underlying CAD that shared decision was to proceed with left heart catheterization. Discussed risks, benefits, and alternatives to left  heart catheterization with possible intervention.  In the interim, if he has chest discomfort of same magnitude or if the symptoms increase in intensity, frequency, and duration patient is educated on the importance of going to the closest ER via EMS for further evaluation and management.  Patient understands that untreated & CAD can lead to morbidity mortality and even death.  He verbalizes understanding.  Medications reconciled.  Continue nebivolol.  We  will add Ranexa and sublingual nitroglycerin tablets.  Patient denies access to erectile dysfunction/BPH medication such as but not limited to Viagra/sildenafil, Cialis/tadalafil, Levitra/vardenafil.  Patient does understand that there are drug to drug interactions with this class of medication and nitro tablets and they should not be used in conjunction.  Start aspirin 81 mg p.o. daily  Echocardiogram will be ordered to evaluate for structural heart disease and left ventricular systolic function.  The procedure of left heart catheterization with possible intervention was explained to the patient in detail.   The indication, alternatives, risks and benefits were reviewed.   Complications include but not limited to bleeding, infection, vascular injury, stroke, myocardial infection, arrhythmia, kidney injury requiring hemodialysis, radiation-related injury in the case of prolonged fluoroscopy use, emergency cardiac surgery, and death. The patient understands the risks of serious complication is 1-2 in [id number] with diagnostic cardiac cath and 1-2% or less with angioplasty/stenting.   The patient voices understanding and provides verbal feedback and wishes to proceed with coronary angiography with possible PCI.  Dyspnea on exertion:  Echocardiogram will be ordered to evaluate for structural heart disease and left ventricular systolic function.  Check BNP.  Benign essential hypertension:  Office blood pressures within acceptable  range.  Medications reconciled.  Low-salt diet recommended.  Mixed hyperlipidemia:  Continue Crestor 10 mg p.o. nightly.  We will check a fasting lipid profile at the next office visit.  FINAL MEDICATION LIST END OF ENCOUNTER: Meds ordered this encounter  Medications  . aspirin EC 81 MG tablet    Sig: Take 1 tablet (81 mg total) by mouth daily. Swallow whole.  . nitroGLYCERIN (NITROSTAT) 0.4 MG SL tablet    Sig: Place 1 tablet (0.4 mg total) under the tongue every 5 (five) minutes as needed for chest pain. If you require more than two tablets five minutes apart go to the nearest ER via EMS.    Dispense:  30 tablet    Refill:  0  . ranolazine (RANEXA) 500 MG 12 hr tablet    Sig: Take 1 tablet (500 mg total) by mouth 2 (two) times daily.    Dispense:  60 tablet    Refill:  0    Medications Discontinued During This Encounter  Medication Reason  . traZODone (DESYREL) 50 MG tablet Error  . cyclobenzaprine (FLEXERIL) 10 MG tablet Error     Current Outpatient Medications:  .  acetaminophen (TYLENOL) 325 MG tablet, Take 650 mg by mouth every 6 (six) hours as needed for moderate pain., Disp: , Rfl:  .  amitriptyline (ELAVIL) 50 MG tablet, Take 50 mg by mouth at bedtime., Disp: , Rfl:  .  amLODipine (NORVASC) 5 MG tablet, Take 5 mg by mouth daily., Disp: , Rfl:  .  aspirin EC 81 MG tablet, Take 1 tablet (81 mg total) by mouth daily. Swallow whole., Disp: , Rfl:  .  nebivolol (BYSTOLIC) 10 MG tablet, Take 1 tablet (10 mg total) by mouth every evening., Disp: 30 tablet, Rfl: 0 .  nitroGLYCERIN (NITROSTAT) 0.4 MG SL tablet, Place 1 tablet (0.4 mg total) under the tongue every 5 (five) minutes as needed for chest pain. If you require more than two tablets five minutes apart go to the nearest ER via EMS., Disp: 30 tablet, Rfl: 0 .  omeprazole (PRILOSEC) 40 MG capsule, TAKE 1 CAPSULE BY MOUTH 2 TIMES DAILY BEFORE A MEAL. NEEDS OFFICE VISIT FOR FURTHER REFILLS (Patient taking differently: Take  40 mg by mouth in the morning and at  bedtime.), Disp: 180 capsule, Rfl: 0 .  oxyCODONE-acetaminophen (PERCOCET) 10-325 MG tablet, Take 1 tablet by mouth every 6 (six) hours as needed for pain., Disp: , Rfl: 0 .  ranolazine (RANEXA) 500 MG 12 hr tablet, Take 1 tablet (500 mg total) by mouth 2 (two) times daily., Disp: 60 tablet, Rfl: 0 .  rosuvastatin (CRESTOR) 10 MG tablet, Take 10 mg by mouth at bedtime., Disp: , Rfl:  .  valsartan-hydrochlorothiazide (DIOVAN-HCT) 320-12.5 MG tablet, Take 1 tablet by mouth daily., Disp: , Rfl:   Orders Placed This Encounter  Procedures  . SARS-COV-2 RNA,(COVID-19) QUAL NAAT  . Basic metabolic panel  . Magnesium  . Pro b natriuretic peptide (BNP)  . EKG 12-Lead  . PCV ECHOCARDIOGRAM COMPLETE    There are no Patient Instructions on file for this visit.   --Continue cardiac medications as reconciled in final medication list. --Return in about 2 weeks (around 02/09/2021) for Post heart catheterization. Or sooner if needed. --Continue follow-up with your primary care physician regarding the management of your other chronic comorbid conditions.  Patient's questions and concerns were addressed to his satisfaction. He voices understanding of the instructions provided during this encounter.   This note was created using a voice recognition software as a result there may be grammatical errors inadvertently enclosed that do not reflect the nature of this encounter. Every attempt is made to correct such errors.  Joseph Fisher, [place], [facility]  Pager: [phone] Office: [phone]

## 2021-01-27 ENCOUNTER — Other Ambulatory Visit (HOSPITAL_COMMUNITY)
Admission: RE | Admit: 2021-01-27 | Discharge: 2021-01-27 | Disposition: A | Discharge status: Home / Self Care | Payer: Medicare Other | Source: Ambulatory Visit | Attending: Cardiology | Admitting: Cardiology

## 2021-01-27 DIAGNOSIS — Z01812 Encounter for preprocedural laboratory examination: Secondary | ICD-10-CM | POA: Insufficient documentation

## 2021-01-27 DIAGNOSIS — Z20822 Contact with and (suspected) exposure to covid-19: Secondary | ICD-10-CM | POA: Insufficient documentation

## 2021-01-27 LAB — BASIC METABOLIC PANEL
BUN/Creatinine Ratio: 15 (ref 9–20)
BUN: 14 mg/dL (ref 6–24)
CO2: 24 mmol/L (ref 20–29)
Calcium: 9.4 mg/dL (ref 8.7–10.2)
Chloride: 103 mmol/L (ref 96–106)
Creatinine, Ser: 0.93 mg/dL (ref 0.76–1.27)
Glucose: 125 mg/dL — ABNORMAL HIGH (ref 65–99)
Potassium: 4.4 mmol/L (ref 3.5–5.2)
Sodium: 142 mmol/L (ref 134–144)
eGFR: 98 mL/min/{1.73_m2} (ref >59)

## 2021-01-27 LAB — MAGNESIUM: Magnesium: 2 mg/dL (ref 1.6–2.3)

## 2021-01-27 LAB — SARS CORONAVIRUS 2 (TAT 6-24 HRS): SARS Coronavirus 2: NEGATIVE

## 2021-01-27 LAB — PRO B NATRIURETIC PEPTIDE: NT-Pro BNP: 95 pg/mL (ref 0–121)

## 2021-01-30 ENCOUNTER — Encounter (HOSPITAL_COMMUNITY)
Admission: RE | Disposition: A | Discharge status: Home / Self Care | Payer: Self-pay | Source: Home / Self Care | Attending: Cardiology

## 2021-01-30 ENCOUNTER — Other Ambulatory Visit: Payer: Self-pay

## 2021-01-30 ENCOUNTER — Other Ambulatory Visit (HOSPITAL_COMMUNITY): Payer: Self-pay

## 2021-01-30 ENCOUNTER — Ambulatory Visit (HOSPITAL_COMMUNITY)
Admission: RE | Admit: 2021-01-30 | Discharge: 2021-01-30 | Disposition: A | Discharge status: Home / Self Care | Payer: Medicare Other | Attending: Cardiology | Admitting: Cardiology

## 2021-01-30 DIAGNOSIS — R0609 Other forms of dyspnea: Secondary | ICD-10-CM | POA: Diagnosis not present

## 2021-01-30 DIAGNOSIS — E782 Mixed hyperlipidemia: Secondary | ICD-10-CM | POA: Insufficient documentation

## 2021-01-30 DIAGNOSIS — G4733 Obstructive sleep apnea (adult) (pediatric): Secondary | ICD-10-CM | POA: Insufficient documentation

## 2021-01-30 DIAGNOSIS — I25119 Atherosclerotic heart disease of native coronary artery with unspecified angina pectoris: Secondary | ICD-10-CM | POA: Insufficient documentation

## 2021-01-30 DIAGNOSIS — Z7982 Long term (current) use of aspirin: Secondary | ICD-10-CM | POA: Insufficient documentation

## 2021-01-30 DIAGNOSIS — I208 Other forms of angina pectoris: Secondary | ICD-10-CM | POA: Diagnosis present

## 2021-01-30 DIAGNOSIS — Z6837 Body mass index (BMI) 37.0-37.9, adult: Secondary | ICD-10-CM | POA: Insufficient documentation

## 2021-01-30 DIAGNOSIS — Z79899 Other long term (current) drug therapy: Secondary | ICD-10-CM | POA: Insufficient documentation

## 2021-01-30 DIAGNOSIS — E669 Obesity, unspecified: Secondary | ICD-10-CM | POA: Diagnosis not present

## 2021-01-30 DIAGNOSIS — I10 Essential (primary) hypertension: Secondary | ICD-10-CM | POA: Insufficient documentation

## 2021-01-30 HISTORY — PX: CORONARY STENT INTERVENTION: CATH118234

## 2021-01-30 HISTORY — PX: LEFT HEART CATH AND CORONARY ANGIOGRAPHY: CATH118249

## 2021-01-30 HISTORY — PX: INTRAVASCULAR IMAGING/OCT: CATH118326

## 2021-01-30 LAB — CBC
HCT: 44.5 % (ref 39.0–52.0)
Hemoglobin: 15.2 g/dL (ref 13.0–17.0)
MCH: 30.2 pg (ref 26.0–34.0)
MCHC: 34.2 g/dL (ref 30.0–36.0)
MCV: 88.5 fL (ref 80.0–100.0)
Platelets: 189 10*3/uL (ref 150–400)
RBC: 5.03 MIL/uL (ref 4.22–5.81)
RDW: 12.7 % (ref 11.5–15.5)
WBC: 4.7 10*3/uL (ref 4.0–10.5)
nRBC: 0 % (ref 0.0–0.2)

## 2021-01-30 LAB — POCT ACTIVATED CLOTTING TIME: Activated Clotting Time: 291 seconds

## 2021-01-30 SURGERY — LEFT HEART CATH AND CORONARY ANGIOGRAPHY
Anesthesia: LOCAL

## 2021-01-30 MED ORDER — SODIUM CHLORIDE 0.9 % WEIGHT BASED INFUSION: 1.0000 mL/kg/h | INTRAVENOUS | Status: DC

## 2021-01-30 MED ORDER — NITROGLYCERIN 1 MG/10 ML FOR IR/CATH LAB
INTRA_ARTERIAL | Status: DC | PRN
  Administered 2021-01-30 (×2): 200 ug via INTRACORONARY

## 2021-01-30 MED ORDER — VERAPAMIL HCL 2.5 MG/ML IV SOLN
INTRAVENOUS | Status: DC | PRN
  Administered 2021-01-30: 10 mL via INTRA_ARTERIAL

## 2021-01-30 MED ORDER — LIDOCAINE HCL (PF) 1 % IJ SOLN
INTRAMUSCULAR | Status: DC | PRN
  Administered 2021-01-30: 2 mL

## 2021-01-30 MED ORDER — SODIUM CHLORIDE 0.9 % WEIGHT BASED INFUSION
3.0000 mL/kg/h | INTRAVENOUS | Status: AC
  Administered 2021-01-30: 3 mL/kg/h via INTRAVENOUS

## 2021-01-30 MED ORDER — ONDANSETRON HCL 4 MG/2ML IJ SOLN: 4.0000 mg | Freq: Four times a day (QID) | INTRAMUSCULAR | Status: DC | PRN

## 2021-01-30 MED ORDER — SODIUM CHLORIDE 0.9% FLUSH: 3.0000 mL | Freq: Two times a day (BID) | INTRAVENOUS | Status: DC

## 2021-01-30 MED ORDER — OXYCODONE-ACETAMINOPHEN 5-325 MG PO TABS
ORAL_TABLET | ORAL | Status: AC
  Filled 2021-01-30: qty 1

## 2021-01-30 MED ORDER — FENTANYL CITRATE (PF) 100 MCG/2ML IJ SOLN
INTRAMUSCULAR | Status: AC
  Filled 2021-01-30: qty 2

## 2021-01-30 MED ORDER — SODIUM CHLORIDE 0.9 % IV SOLN: 250.0000 mL | INTRAVENOUS | Status: DC | PRN

## 2021-01-30 MED ORDER — TICAGRELOR 90 MG PO TABS
ORAL_TABLET | ORAL | Status: DC | PRN
  Administered 2021-01-30: 180 mg via ORAL

## 2021-01-30 MED ORDER — SODIUM CHLORIDE 0.9% FLUSH: 3.0000 mL | INTRAVENOUS | Status: DC | PRN

## 2021-01-30 MED ORDER — HEPARIN (PORCINE) IN NACL 1000-0.9 UT/500ML-% IV SOLN
INTRAVENOUS | Status: AC
  Filled 2021-01-30: qty 1000

## 2021-01-30 MED ORDER — OXYCODONE-ACETAMINOPHEN 5-325 MG PO TABS
1.0000 | ORAL_TABLET | Freq: Once | ORAL | Status: AC
  Administered 2021-01-30: 1 via ORAL

## 2021-01-30 MED ORDER — FENTANYL CITRATE (PF) 100 MCG/2ML IJ SOLN
INTRAMUSCULAR | Status: DC | PRN
  Administered 2021-01-30 (×2): 50 ug via INTRAVENOUS

## 2021-01-30 MED ORDER — ASPIRIN 81 MG PO CHEW
81.0000 mg | CHEWABLE_TABLET | ORAL | Status: AC
  Administered 2021-01-30: 81 mg via ORAL
  Filled 2021-01-30: qty 1

## 2021-01-30 MED ORDER — IOHEXOL 350 MG/ML SOLN
INTRAVENOUS | Status: DC | PRN
  Administered 2021-01-30: 155 mL

## 2021-01-30 MED ORDER — TICAGRELOR 90 MG PO TABS: 90.0000 mg | ORAL_TABLET | Freq: Two times a day (BID) | ORAL | Status: DC

## 2021-01-30 MED ORDER — TICAGRELOR 90 MG PO TABS
ORAL_TABLET | ORAL | Status: AC
  Filled 2021-01-30: qty 2

## 2021-01-30 MED ORDER — SODIUM CHLORIDE 0.9 % IV SOLN: INTRAVENOUS | Status: AC

## 2021-01-30 MED ORDER — ANGIOPLASTY BOOK
Status: AC
  Filled 2021-01-30: qty 1

## 2021-01-30 MED ORDER — ACETAMINOPHEN 325 MG PO TABS: 650.0000 mg | ORAL_TABLET | ORAL | Status: DC | PRN

## 2021-01-30 MED ORDER — LIDOCAINE HCL (PF) 1 % IJ SOLN
INTRAMUSCULAR | Status: AC
  Filled 2021-01-30: qty 30

## 2021-01-30 MED ORDER — VERAPAMIL HCL 2.5 MG/ML IV SOLN
INTRAVENOUS | Status: AC
  Filled 2021-01-30: qty 2

## 2021-01-30 MED ORDER — HEPARIN (PORCINE) IN NACL 1000-0.9 UT/500ML-% IV SOLN
INTRAVENOUS | Status: DC | PRN
Start: 2021-01-30 — End: 2021-01-30
  Administered 2021-01-30 (×3): 500 mL

## 2021-01-30 MED ORDER — MIDAZOLAM HCL 2 MG/2ML IJ SOLN
INTRAMUSCULAR | Status: AC
  Filled 2021-01-30: qty 2

## 2021-01-30 MED ORDER — MIDAZOLAM HCL 2 MG/2ML IJ SOLN
INTRAMUSCULAR | Status: DC | PRN
  Administered 2021-01-30 (×2): 1 mg via INTRAVENOUS

## 2021-01-30 MED ORDER — LABETALOL HCL 5 MG/ML IV SOLN: 10.0000 mg | INTRAVENOUS | Status: DC | PRN

## 2021-01-30 MED ORDER — TICAGRELOR 90 MG PO TABS
90.0000 mg | ORAL_TABLET | Freq: Two times a day (BID) | ORAL | 1 refills | Status: DC
  Filled 2021-01-30: qty 60, 30d supply, fill #0

## 2021-01-30 MED ORDER — NITROGLYCERIN 1 MG/10 ML FOR IR/CATH LAB
INTRA_ARTERIAL | Status: AC
  Filled 2021-01-30: qty 10

## 2021-01-30 MED ORDER — HEPARIN SODIUM (PORCINE) 1000 UNIT/ML IJ SOLN
INTRAMUSCULAR | Status: DC | PRN
  Administered 2021-01-30 (×3): 5000 [IU] via INTRAVENOUS

## 2021-01-30 MED ORDER — HYDRALAZINE HCL 20 MG/ML IJ SOLN
10.0000 mg | INTRAMUSCULAR | Status: DC | PRN
Start: 2021-01-30 — End: 2021-01-30

## 2021-01-30 SURGICAL SUPPLY — 20 items
BALLN EMERGE MR 2.5X15 (BALLOONS) ×2
BALLN SAPPHIRE ~~LOC~~ 3.25X8 (BALLOONS) ×2 IMPLANT
BALLN ~~LOC~~ EMERGE MR 2.5X12 (BALLOONS) ×2
BALLOON EMERGE MR 2.5X15 (BALLOONS) ×1 IMPLANT
BALLOON ~~LOC~~ EMERGE MR 2.5X12 (BALLOONS) ×1 IMPLANT
CATH DRAGONFLY OPSTAR (CATHETERS) ×2 IMPLANT
CATH LAUNCHER 6FR EBU3.5 (CATHETERS) ×2 IMPLANT
CATH OPTITORQUE TIG 4.0 5F (CATHETERS) ×2 IMPLANT
DEVICE RAD COMP TR BAND LRG (VASCULAR PRODUCTS) ×2 IMPLANT
GUIDEWIRE INQWIRE 1.5J.035X260 (WIRE) ×1 IMPLANT
INQWIRE 1.5J .035X260CM (WIRE) ×2
KIT ENCORE 26 ADVANTAGE (KITS) ×2 IMPLANT
KIT HEART LEFT (KITS) ×2 IMPLANT
KIT HEMO VALVE WATCHDOG (MISCELLANEOUS) ×2 IMPLANT
PACK CARDIAC CATHETERIZATION (CUSTOM PROCEDURE TRAY) ×2 IMPLANT
STENT RESOLUTE ONYX 3.0X18 (Permanent Stent) ×2 IMPLANT
TRANSDUCER W/STOPCOCK (MISCELLANEOUS) ×2 IMPLANT
TUBING CIL FLEX 10 FLL-RA (TUBING) ×2 IMPLANT
WIRE ASAHI PROWATER 180CM (WIRE) ×2 IMPLANT
WIRE COUGAR XT STRL 190CM (WIRE) ×2 IMPLANT

## 2021-01-30 NOTE — H&P (Signed)
OV 01/30/2021 copied for documentation    Date:  01/30/2021   ID:  Joseph Fisher, DOB [birth year], MRN [id number]  PCP:  Lindaann Pascal, PA-C  Cardiologist:  Elder Negus, DO, FACC (established care 01/26/2021)  REASON FOR CONSULT: Chest pain  REQUESTING PHYSICIAN:  No referring provider defined for this encounter.  No chief complaint on file.   HPI  Joseph Fisher is a 55 y.o. male who presents to the office with a chief complaint of " chest pain." Patient's past medical history and cardiovascular risk factors include: Hypertension, OSA not on CPAP, hyperlipidemia, obesity due to excess calories.  He is referred to the office at the request of No ref. provider found for evaluation of chest pain.  Patient is referred to the office for evaluation of chest discomfort.  He states that the discomfort started approximately 3 weeks ago while he was walking his 2 miles on a regular basis.  When the discomfort occurred he sat down and the symptoms resolved.  Since then he continues to have effort related chest discomfort located on his left anterior chest, intensity 7 out of 10, tightness-like sensation, brought on by effort related activities, resolves with rest, radiates to the left arm.  No active chest pain at the time of the evaluation.  His last discomfort was this past Tuesday while he was exercising.  Patient states that he usually walks 2 miles per day but at times now he notices chest discomfort by a quarter of a mile.  When asked why he has not seek medical attention over the last 3 weeks he does not have a clear explanation.  No family history of premature coronary disease or sudden cardiac death.  FUNCTIONAL STATUS: He exercises on a daily basis for approximately 60 minutes.  He tries to walk at least 2 miles per day and also some resistance training at the gym.  ALLERGIES: No Known Allergies  MEDICATION LIST PRIOR TO VISIT: Current Meds  Medication Sig  . acetaminophen  (TYLENOL) 325 MG tablet Take 650 mg by mouth every 6 (six) hours as needed for moderate pain.  Marland Kitchen amitriptyline (ELAVIL) 50 MG tablet Take 50 mg by mouth at bedtime.  Marland Kitchen amLODipine (NORVASC) 5 MG tablet Take 5 mg by mouth daily.  Marland Kitchen aspirin EC 81 MG tablet Take 1 tablet (81 mg total) by mouth daily. Swallow whole.  . nebivolol (BYSTOLIC) 10 MG tablet Take 1 tablet (10 mg total) by mouth every evening.  Marland Kitchen omeprazole (PRILOSEC) 40 MG capsule TAKE 1 CAPSULE BY MOUTH 2 TIMES DAILY BEFORE A MEAL. NEEDS OFFICE VISIT FOR FURTHER REFILLS (Patient taking differently: Take 40 mg by mouth in the morning and at bedtime.)  . oxyCODONE-acetaminophen (PERCOCET) 10-325 MG tablet Take 1 tablet by mouth every 6 (six) hours as needed for pain.  . ranolazine (RANEXA) 500 MG 12 hr tablet Take 1 tablet (500 mg total) by mouth 2 (two) times daily.  . rosuvastatin (CRESTOR) 10 MG tablet Take 10 mg by mouth at bedtime.  . valsartan-hydrochlorothiazide (DIOVAN-HCT) 320-12.5 MG tablet Take 1 tablet by mouth daily.     PAST MEDICAL HISTORY: Past Medical History:  Diagnosis Date  . Anemia, unspecified 07/14/2014  . Barrett's esophagus   . Blood transfusion without reported diagnosis 05/2014  . Cervical stenosis of spine   . Chronic back pain   . Diverticulosis   . External hemorrhoids   . Gastric ulcer   . GERD (gastroesophageal reflux disease)   . GI bleed   .  Hemorrhagic shock (HCC)   . Hyperlipidemia   . Hypertension   . Hypoxemia 06/20/2014  . Insomnia   . OSA (obstructive sleep apnea) 01/06/2019  . Peptic ulcer   . Pneumonia   . RLS (restless legs syndrome) 06/20/2014  . Snoring 06/20/2014  . Unspecified deficiency anemia 07/14/2014    PAST SURGICAL HISTORY: Past Surgical History:  Procedure Laterality Date  . ANTERIOR CERVICAL DECOMP/DISCECTOMY FUSION N/A 10/03/2017   Procedure: ACDF - C4-C5 - C5-C6 - C6-C7;  Surgeon: Julio Sicks, MD;  Location: [facility] OR;  Service: Neurosurgery;  Laterality: N/A;  . BACK  SURGERY  2013  . ESOPHAGOGASTRODUODENOSCOPY N/A 06/04/2014   Procedure: ESOPHAGOGASTRODUODENOSCOPY (EGD);  Surgeon: Beverley Fiedler, MD;  Location: [facility] ENDOSCOPY;  Service: Endoscopy;  Laterality: N/A;    FAMILY HISTORY: The patient family history includes Hypertension in his father and mother.  SOCIAL HISTORY:  The patient  reports that he has never smoked. He has never used smokeless tobacco. He reports previous alcohol use. He reports that he does not use drugs.  REVIEW OF SYSTEMS: Review of Systems  Constitutional: Positive for malaise/fatigue. Negative for chills and fever.  HENT: Negative for hoarse voice and nosebleeds.   Eyes: Negative for discharge, double vision and pain.  Cardiovascular: Positive for chest pain and dyspnea on exertion. Negative for claudication, leg swelling, near-syncope, orthopnea, palpitations, paroxysmal nocturnal dyspnea and syncope.  Respiratory: Positive for shortness of breath. Negative for hemoptysis.   Musculoskeletal: Negative for muscle cramps and myalgias.  Gastrointestinal: Negative for abdominal pain, constipation, diarrhea, hematemesis, hematochezia, melena, nausea and vomiting.  Neurological: Negative for dizziness and light-headedness.    PHYSICAL EXAM: Vitals with BMI 01/30/2021 01/26/2021 02/28/2020  Height 5\' 6"  5\' 6"  5\' 6"   Weight 230 lbs 231 lbs 224 lbs 10 oz  BMI 37.14 37.3 36.27  Systolic 125 134 409  Diastolic 82 85 82  Pulse 64 64 74   CONSTITUTIONAL: Well-developed and well-nourished. No acute distress.  SKIN: Skin is warm and dry. No rash noted. No cyanosis. No pallor. No jaundice HEAD: Normocephalic and atraumatic.  EYES: No scleral icterus MOUTH/THROAT: Moist oral membranes.  NECK: No JVD present. No thyromegaly noted. No carotid bruits  LYMPHATIC: No visible cervical adenopathy.  CHEST Normal respiratory effort. No intercostal retractions  LUNGS: Clear to auscultation bilaterally.  No stridor. No wheezes. No rales.   CARDIOVASCULAR: Regular rate and rhythm, positive S1-S2, no murmurs rubs or gallops appreciated ABDOMINAL: Obese, soft, nontender, nondistended, positive bowel sounds in all 4 quadrants no apparent ascites.  EXTREMITIES: No peripheral edema  HEMATOLOGIC: No significant bruising NEUROLOGIC: Oriented to person, place, and time. Nonfocal. Normal muscle tone.  PSYCHIATRIC: Normal mood and affect. Normal behavior. Cooperative  CARDIAC DATABASE: EKG: 01/26/2021: Sinus  Rhythm, 67 bpm, T wave inversions in the inferior leads suggestive of possible ischemia, without underlying injury pattern.  Echocardiogram: No results found for this or any previous visit from the past 1095 days.   Stress Testing: No results found for this or any previous visit from the past 1095 days.   Heart Catheterization: None  LABORATORY DATA: CBC Latest Ref Rng & Units 01/30/2021 09/29/2017 10/31/2014  WBC 4.0 - 10.5 K/uL 4.7 3.9(L) 3.4(L)  Hemoglobin 13.0 - 17.0 g/dL 81.1 91.4 78.2  Hematocrit 39.0 - 52.0 % 44.5 44.0 42.5  Platelets 150 - 400 K/uL 189 152 128(L)    CMP Latest Ref Rng & Units 01/26/2021 09/29/2017 10/09/2014  Glucose 65 - 99 mg/dL 956(O) 130(Q) 657(Q)  BUN 6 -  24 mg/dL 14 12 11   Creatinine 0.76 - 1.27 mg/dL 1.61 0.96 0.45  Sodium 134 - 144 mmol/L 142 138 137  Potassium 3.5 - 5.2 mmol/L 4.4 3.8 4.3  Chloride 96 - 106 mmol/L 103 103 96  CO2 20 - 29 mmol/L 24 27 23   Calcium 8.7 - 10.2 mg/dL 9.4 9.7 40.9  Total Protein 6.0 - 8.3 g/dL - - 8.4(H)  Total Bilirubin 0.3 - 1.2 mg/dL - - 0.8  Alkaline Phos 39 - 117 U/L - - 47  AST 0 - 37 U/L - - 18  ALT 0 - 53 U/L - - 19    Lipid Panel  No results found for: CHOL, TRIG, HDL, CHOLHDL, VLDL, LDLCALC, LDLDIRECT, LABVLDL  No components found for: NTPROBNP  Recent Labs    01/26/21 1422  PROBNP 95   No results for input(s): TSH in the last 8760 hours.  BMP Recent Labs    01/26/21 1422  NA 142  K 4.4  CL 103  CO2 24  GLUCOSE 125*  BUN 14   CREATININE 0.93  CALCIUM 9.4    HEMOGLOBIN A1C No results found for: HGBA1C, MPG  External Labs: Collected: 01/13/2021, provided by PCP Hemoglobin 14.1 g/dL, hematocrit 81.1% Platelets 147 Creatinine 0.84 mg/dL. eGFR: 99 mL/min per 1.73 m Potassium 3.8 AST 21, ALT 27, alkaline phosphatase 53 Hemoglobin A1c: 6.1  IMPRESSION:  No diagnosis found.   RECOMMENDATIONS: GILROY MCKELLIPS is a 55 y.o. male whose past medical history and cardiac risk factors include:  Hypertension, OSA not on CPAP, hyperlipidemia, obesity due to excess calories.  Angina pectoris:  Patient does not have any active chest pain.  EKG does not show any underlying injury pattern.  Due to high pretest probability of underlying CAD that shared decision was to proceed with left heart catheterization. Discussed risks, benefits, and alternatives to left heart catheterization with possible intervention.  In the interim, if he has chest discomfort of same magnitude or if the symptoms increase in intensity, frequency, and duration patient is educated on the importance of going to the closest ER via EMS for further evaluation and management.  Patient understands that untreated & CAD can lead to morbidity mortality and even death.  He verbalizes understanding.  Medications reconciled.  Continue nebivolol.  We will add Ranexa and sublingual nitroglycerin tablets.  Patient denies access to erectile dysfunction/BPH medication such as but not limited to Viagra/sildenafil, Cialis/tadalafil, Levitra/vardenafil.  Patient does understand that there are drug to drug interactions with this class of medication and nitro tablets and they should not be used in conjunction.  Start aspirin 81 mg p.o. daily  Echocardiogram will be ordered to evaluate for structural heart disease and left ventricular systolic function.  The procedure of left heart catheterization with possible intervention was explained to the patient in detail.    The indication, alternatives, risks and benefits were reviewed.   Complications include but not limited to bleeding, infection, vascular injury, stroke, myocardial infection, arrhythmia, kidney injury requiring hemodialysis, radiation-related injury in the case of prolonged fluoroscopy use, emergency cardiac surgery, and death. The patient understands the risks of serious complication is 1-2 in 1000 with diagnostic cardiac cath and 1-2% or less with angioplasty/stenting.   The patient voices understanding and provides verbal feedback and wishes to proceed with coronary angiography with possible PCI.  Dyspnea on exertion:  Echocardiogram will be ordered to evaluate for structural heart disease and left ventricular systolic function.  Check BNP.  Benign essential hypertension:  Office  blood pressures within acceptable range.  Medications reconciled.  Low-salt diet recommended.  Mixed hyperlipidemia:  Continue Crestor 10 mg p.o. nightly.  We will check a fasting lipid profile at the next office visit.  FINAL MEDICATION LIST END OF ENCOUNTER: Meds ordered this encounter  Medications  . sodium chloride flush (NS) 0.9 % injection 3 mL  . sodium chloride flush (NS) 0.9 % injection 3 mL  . 0.9 %  sodium chloride infusion  . aspirin chewable tablet 81 mg  . FOLLOWED BY Linked Order Group   . 0.9% sodium chloride infusion   . 0.9% sodium chloride infusion    Medications Discontinued During This Encounter  Medication Reason  . azelastine (ASTELIN) 0.1 % nasal spray Patient Preference     Current Facility-Administered Medications:  .  0.9 %  sodium chloride infusion, 250 mL, Intravenous, PRN, Suzzette Gasparro J, MD .  [EXPIRED] 0.9% sodium chloride infusion, 3 mL/kg/hr, Intravenous, Continuous, Last Rate: 314.4 mL/hr at 01/30/21 0958, 3 mL/kg/hr at 01/30/21 0958 **FOLLOWED BY** 0.9% sodium chloride infusion, 1 mL/kg/hr, Intravenous, Continuous, Martinique Pizzimenti J, MD, Last  Rate: 104.8 mL/hr at 01/30/21 1058, 1 mL/kg/hr at 01/30/21 1058 .  sodium chloride flush (NS) 0.9 % injection 3 mL, 3 mL, Intravenous, Q12H, Suraiya Dickerson J, MD .  sodium chloride flush (NS) 0.9 % injection 3 mL, 3 mL, Intravenous, PRN, Ajdin Macke J, MD  Orders Placed This Encounter  Procedures  . CBC  . Diet NPO time specified  . Informed Consent Details: Physician/Practitioner Attestation; Transcribe to consent form and obtain patient signature  . Confirm CBC and BMP (or CMP) results within 7 days for inpatient and 30 days for outpatient: Outpatients with severe anemia (hgb<10, CKD, severe thrombocytopenia plts<100) labs should be within 10 days. Only draw PT/INR on patients that are on Coumadin, Hgb<10, have liver disease (cirrhosis, liver CA, hepatitis, etc). Urine pregnancy test within hospital admission for inpatients of child bearing age, for outpatients day of procedure.  . Confirm EKG performed within 30 days for cardiac procedures and 12 months for peripheral vascular procedures.  Place order for EKG if missing or not within timeframe.  . Verify aspirin and / or anti-platelet medication (Plavix, Effient, Brilinta) dose available for cardiac / peripheral vascular procedure day. IF ordered daily / once, adjust schedule to administer before procedure.  . Weigh patient  . Initiate Cath/PCI clinical path; encourage patient to watch CCTV video  . Clip R radial (no IV/bracelet R wrist)  . Clip R groin  . Insert peripheral IV  . Insert 2nd peripheral IV site-Saline lock IV    There are no outpatient Patient Instructions on file for this admission.   --Continue cardiac medications as reconciled in final medication list. --No follow-ups on file. Or sooner if needed. --Continue follow-up with your primary care physician regarding the management of your other chronic comorbid conditions.  Patient's questions and concerns were addressed to his satisfaction. He voices understanding  of the instructions provided during this encounter.   This note was created using a voice recognition software as a result there may be grammatical errors inadvertently enclosed that do not reflect the nature of this encounter. Every attempt is made to correct such errors.  Tessa Lerner, [place], [facility]  Pager: [phone] Office: [phone]

## 2021-01-30 NOTE — Discharge Instructions (Signed)
Radial Site Care  This sheet gives you information about how to care for yourself after your procedure. Your health care provider may also give you more specific instructions. If you have problems or questions, contact your health care provider. What can I expect after the procedure? After the procedure, it is common to have:  Bruising and tenderness at the catheter insertion area. Follow these instructions at home: Medicines  Take over-the-counter and prescription medicines only as told by your health care provider. Insertion site care  Follow instructions from your health care provider about how to take care of your insertion site. Make sure you: ? Wash your hands with soap and water before you change your bandage (dressing). If soap and water are not available, use hand sanitizer. ? Change your dressing as told by your health care provider. ? Leave stitches (sutures), skin glue, or adhesive strips in place. These skin closures may need to stay in place for 2 weeks or longer. If adhesive strip edges start to loosen and curl up, you may trim the loose edges. Do not remove adhesive strips completely unless your health care provider tells you to do that.  Check your insertion site every day for signs of infection. Check for: ? Redness, swelling, or pain. ? Fluid or blood. ? Pus or a bad smell. ? Warmth.  Do not take baths, swim, or use a hot tub until your health care provider approves.  You may shower 24-48 hours after the procedure, or as directed by your health care provider. ? Remove the dressing and gently wash the site with plain soap and water. ? Pat the area dry with a clean towel. ? Do not rub the site. That could cause bleeding.  Do not apply powder or lotion to the site. Activity  For 24 hours after the procedure, or as directed by your health care provider: ? Do not flex or bend the affected arm. ? Do not push or pull heavy objects with the affected arm. ? Do not drive  yourself home from the hospital or clinic. You may drive 24 hours after the procedure unless your health care provider tells you not to. ? Do not operate machinery or power tools.  Do not lift anything that is heavier than 10 lb (4.5 kg), or the limit that you are told, until your health care provider says that it is safe.  Ask your health care provider when it is okay to: ? Return to work or school. ? Resume usual physical activities or sports. ? Resume sexual activity.   General instructions  If the catheter site starts to bleed, raise your arm and put firm pressure on the site. If the bleeding does not stop, get help right away. This is a medical emergency.  If you went home on the same day as your procedure, a responsible adult should be with you for the first 24 hours after you arrive home.  Keep all follow-up visits as told by your health care provider. This is important. Contact a health care provider if:  You have a fever.  You have redness, swelling, or yellow drainage around your insertion site. Get help right away if:  You have unusual pain at the radial site.  The catheter insertion area swells very fast.  The insertion area is bleeding, and the bleeding does not stop when you hold steady pressure on the area.  Your arm or hand becomes pale, cool, tingly, or numb. These symptoms may represent a serious   problem that is an emergency. Do not wait to see if the symptoms will go away. Get medical help right away. Call your local emergency services (911 in the [place] Do not drive yourself to the hospital. Summary  After the procedure, it is common to have bruising and tenderness at the site.  Follow instructions from your health care provider about how to take care of your radial site wound. Check the wound every day for signs of infection.  Do not lift anything that is heavier than 10 lb (4.5 kg), or the limit that you are told, until your health care provider says that it  is safe. This information is not intended to replace advice given to you by your health care provider. Make sure you discuss any questions you have with your health care provider. Document Revised: 11/19/2017 Document Reviewed: 11/19/2017 Elsevier Patient Education  2021 Elsevier Inc.  

## 2021-01-30 NOTE — Progress Notes (Signed)
Dr Virgina Jock in and client c/o having to take a couple of breaths to feel like getting breath; client notified by Dr Virgina Jock that feeling could be from [place] and for client to drink caffeine with brillilnta and client voiced understanding and per Dr Virgina Jock client could be changed to another blood thinner if client cont to have problems with that and client voiced understanding

## 2021-01-30 NOTE — Research (Signed)
IDENTIFY Informed Consent   Subject Name: Joseph Fisher  Subject met inclusion and exclusion criteria.  The informed consent form, study requirements and expectations were reviewed with the subject and questions and concerns were addressed prior to the signing of the consent form.  The subject verbalized understanding of the trail requirements.  The subject agreed to participate in the IDENTIFY trial and signed the informed consent.  The informed consent was obtained prior to performance of any protocol-specific procedures for the subject.  A copy of the signed informed consent was given to the subject and a copy was placed in the subject's medical record.  Philemon Kingdom D 01/30/2021, 1050 am

## 2021-01-30 NOTE — Progress Notes (Signed)
Client c/o right arm pain, Dr Virgina Jock notified and order noted and med given for pain

## 2021-01-30 NOTE — Interval H&P Note (Signed)
History and Physical Interval Note:  01/30/2021 2:10 PM  Joseph Fisher  has presented today for surgery, with the diagnosis of chest pain, SOB.  The various methods of treatment have been discussed with the patient and family. After consideration of risks, benefits and other options for treatment, the patient has consented to  Procedure(s): LEFT HEART CATH AND CORONARY ANGIOGRAPHY (N/A), and possible coronary intervention. The patient's history has been reviewed, patient examined, no change in status, stable for surgery.  I have reviewed the patient's chart and labs.  Questions were answered to the patient's satisfaction.    2012 Appropriate Use Criteria for Diagnostic Catheterization Home / Select Test of Interest Desired Test and Scenario Suspected CAD No Prior Noninvasive Imaging Pretest Probability of CAD CAD Assessment (Coronary Angiography With or Without Left Heart Catheterization and/or Left Ventricu CAD Assessment (Coronary Angiography With or Without Left Heart Catheterization and/or Left Ventriculography) Link Here: [email or web address] Indication:  Suspected CAD (No Prior PCI, No Prior CABG, and No Prior Angiogram Showing >= 50% Angiographic Stenosis) No Prior Noninvasive Testing Symptomatic High Pretest Probability A (7) Indication: 10; Score 7   Joseph Fisher

## 2021-01-31 ENCOUNTER — Encounter (HOSPITAL_COMMUNITY): Payer: Self-pay | Admitting: Cardiology

## 2021-02-01 ENCOUNTER — Other Ambulatory Visit: Payer: Self-pay | Admitting: Cardiology

## 2021-02-01 ENCOUNTER — Encounter: Payer: Self-pay | Admitting: Cardiology

## 2021-02-01 DIAGNOSIS — I2510 Atherosclerotic heart disease of native coronary artery without angina pectoris: Secondary | ICD-10-CM

## 2021-02-01 DIAGNOSIS — Z955 Presence of coronary angioplasty implant and graft: Secondary | ICD-10-CM

## 2021-02-01 NOTE — Progress Notes (Signed)
Received a call from patient's sister. Patient has continued to have subjective feeling of shortness of breath every time he takes the medication Brilinta. I recommended that we switch to Plavix and take it as directed.  75 mg tablet, take eight tablets for total of 600 mg on 02/02/2021. Starting 02/03/2021, please take one tablet daily. I called in the prescription to patient's [facility] at [place].  Humaira Sculley Monica Martinez, MD

## 2021-02-08 ENCOUNTER — Telehealth (HOSPITAL_COMMUNITY): Payer: Self-pay | Admitting: *Deleted

## 2021-02-13 ENCOUNTER — Telehealth (HOSPITAL_COMMUNITY): Payer: Self-pay | Admitting: *Deleted

## 2021-02-14 ENCOUNTER — Telehealth (HOSPITAL_COMMUNITY): Payer: Self-pay

## 2021-02-14 NOTE — Telephone Encounter (Signed)
Pt insurance is active and benefits verified through Medicare A/B. Co-pay $0.00, DED $233.00/$233.00 met, out of pocket $0.00/$0.00 met, co-insurance 20%. No pre-authorization required. Passport, 02/14/21 @ 2:52PM, UTM#[id number]  Will contact patient to see if he is interested in the Cardiac Rehab Program. If interested, patient will need to complete follow up appt. Once completed, patient will be contacted for scheduling upon review by the RN Navigator.

## 2021-02-14 NOTE — Telephone Encounter (Signed)
Attempted to call patient in regards to Cardiac Rehab - LM on VM 

## 2021-02-15 ENCOUNTER — Other Ambulatory Visit: Payer: Self-pay | Admitting: Cardiology

## 2021-02-15 DIAGNOSIS — I209 Angina pectoris, unspecified: Secondary | ICD-10-CM

## 2021-02-18 ENCOUNTER — Other Ambulatory Visit: Payer: Self-pay | Admitting: Cardiology

## 2021-02-18 DIAGNOSIS — I209 Angina pectoris, unspecified: Secondary | ICD-10-CM

## 2021-02-26 ENCOUNTER — Other Ambulatory Visit: Payer: Medicare Other

## 2021-03-02 ENCOUNTER — Ambulatory Visit: Payer: Medicare Other | Admitting: Cardiology

## 2021-03-06 ENCOUNTER — Other Ambulatory Visit: Payer: Self-pay

## 2021-03-06 ENCOUNTER — Ambulatory Visit: Payer: Medicare Other

## 2021-03-06 DIAGNOSIS — R0609 Other forms of dyspnea: Secondary | ICD-10-CM

## 2021-03-06 DIAGNOSIS — I209 Angina pectoris, unspecified: Secondary | ICD-10-CM

## 2021-03-06 DIAGNOSIS — R0600 Dyspnea, unspecified: Secondary | ICD-10-CM

## 2021-03-13 ENCOUNTER — Other Ambulatory Visit: Payer: Self-pay | Admitting: Cardiology

## 2021-03-13 DIAGNOSIS — I209 Angina pectoris, unspecified: Secondary | ICD-10-CM

## 2021-03-15 ENCOUNTER — Other Ambulatory Visit: Payer: Self-pay | Admitting: Cardiology

## 2021-03-15 DIAGNOSIS — I209 Angina pectoris, unspecified: Secondary | ICD-10-CM

## 2021-03-20 NOTE — Progress Notes (Signed)
No answer left a vm to call back

## 2021-03-20 NOTE — Progress Notes (Signed)
Patient called back he is aware

## 2021-03-30 ENCOUNTER — Other Ambulatory Visit: Payer: Self-pay

## 2021-03-30 ENCOUNTER — Ambulatory Visit: Payer: Medicare Other | Admitting: Cardiology

## 2021-03-30 ENCOUNTER — Encounter: Payer: Self-pay | Admitting: Cardiology

## 2021-03-30 VITALS — BP 121/77 | HR 61 | Temp 98.0°F | Resp 16 | Ht 66.0 in | Wt 234.4 lb

## 2021-03-30 DIAGNOSIS — Z6837 Body mass index (BMI) 37.0-37.9, adult: Secondary | ICD-10-CM

## 2021-03-30 DIAGNOSIS — R0609 Other forms of dyspnea: Secondary | ICD-10-CM

## 2021-03-30 DIAGNOSIS — Z955 Presence of coronary angioplasty implant and graft: Secondary | ICD-10-CM

## 2021-03-30 DIAGNOSIS — E782 Mixed hyperlipidemia: Secondary | ICD-10-CM

## 2021-03-30 DIAGNOSIS — I10 Essential (primary) hypertension: Secondary | ICD-10-CM

## 2021-03-30 DIAGNOSIS — G4733 Obstructive sleep apnea (adult) (pediatric): Secondary | ICD-10-CM

## 2021-03-30 DIAGNOSIS — I2510 Atherosclerotic heart disease of native coronary artery without angina pectoris: Secondary | ICD-10-CM

## 2021-03-30 DIAGNOSIS — R0600 Dyspnea, unspecified: Secondary | ICD-10-CM

## 2021-03-30 NOTE — Progress Notes (Signed)
Date:  03/30/2021   ID:  Joseph Fisher, DOB [birth year], MRN [id number]  PCP:  Shanon Rosser, PA-C  Cardiologist:  Rex Kras, DO, [facility] (established care 01/26/2021)  Date: 03/30/21 Last Office Visit: 01/26/2021  Chief Complaint  Patient presents with  . Post cath    Stent placement    HPI  Joseph Fisher is a 54 y.o. male who presents to the office with a chief complaint of " s/p heart cath and stent." Patient's past medical history and cardiovascular risk factors include: CAD s/p PCI,  Hypertension, OSA not on CPAP, hyperlipidemia, obesity due to excess calories.  He is referred to the office at the request of Long, Nicki Reaper, PA-C for evaluation of chest pain.  Patient presented to the office with symptoms of chest pain concerning for unstable angina/angina pectoris.  He refused to go to the hospital for more urgent evaluation and therefore was scheduled for left heart catheterization collectively.  He was noted to have obstructive CAD in the LAD distribution and underwent angioplasty and stenting.  Patient has done well after his coronary intervention.  In no acute/subacute complications.  He continues to walk on a daily basis without any effort related symptoms anymore.  Patient was experiencing shortness of breath more than usual and therefore his Brilinta was changed to Plavix.  FUNCTIONAL STATUS: Walks at least 2 miles per day and also some resistance training at the gym.  ALLERGIES: No Known Allergies  MEDICATION LIST PRIOR TO VISIT: Current Meds  Medication Sig  . acetaminophen (TYLENOL) 325 MG tablet Take 650 mg by mouth every 6 (six) hours as needed for moderate pain.  Marland Kitchen amitriptyline (ELAVIL) 50 MG tablet Take 50 mg by mouth at bedtime.  Marland Kitchen amLODipine (NORVASC) 5 MG tablet Take 5 mg by mouth daily.  Marland Kitchen aspirin EC 81 MG tablet Take 1 tablet (81 mg total) by mouth daily. Swallow whole.  . clopidogrel (PLAVIX) 75 MG tablet Take 75 mg by mouth daily.  . nebivolol (BYSTOLIC) 10  MG tablet Take 1 tablet (10 mg total) by mouth every evening.  . nitroGLYCERIN (NITROSTAT) 0.4 MG SL tablet PLACE 1 TAB UNDER TONGUE EVERY 5 MINS AS NEEDED FOR CHEST PAIN. GO TO ER IS MORE THAN 2 TABS NEEDED  . omeprazole (PRILOSEC) 40 MG capsule TAKE 1 CAPSULE BY MOUTH 2 TIMES DAILY BEFORE A MEAL. NEEDS OFFICE VISIT FOR FURTHER REFILLS (Patient taking differently: Take 40 mg by mouth in the morning and at bedtime.)  . oxyCODONE-acetaminophen (PERCOCET) 10-325 MG tablet Take 1 tablet by mouth every 6 (six) hours as needed for pain.  . ranolazine (RANEXA) 500 MG 12 hr tablet TAKE 1 TABLET BY MOUTH TWICE A DAY  . rosuvastatin (CRESTOR) 10 MG tablet Take 10 mg by mouth at bedtime.     PAST MEDICAL HISTORY: Past Medical History:  Diagnosis Date  . Anemia, unspecified 07/14/2014  . Barrett's esophagus   . Blood transfusion without reported diagnosis 05/2014  . Cervical stenosis of spine   . Chronic back pain   . Diverticulosis   . External hemorrhoids   . Gastric ulcer   . GERD (gastroesophageal reflux disease)   . GI bleed   . Hemorrhagic shock ([place])   . Hyperlipidemia   . Hypertension   . Hypoxemia 06/20/2014  . Insomnia   . OSA (obstructive sleep apnea) 01/06/2019  . Peptic ulcer   . Pneumonia   . RLS (restless legs syndrome) 06/20/2014  . Snoring 06/20/2014  . Unspecified deficiency  anemia 07/14/2014    PAST SURGICAL HISTORY: Past Surgical History:  Procedure Laterality Date  . ANTERIOR CERVICAL DECOMP/DISCECTOMY FUSION N/A 10/03/2017   Procedure: ACDF - C4-C5 - C5-C6 - C6-C7;  Surgeon: Earnie Larsson, MD;  Location: [place];  Service: Neurosurgery;  Laterality: N/A;  . BACK SURGERY  2013  . CORONARY STENT INTERVENTION N/A 01/30/2021   Procedure: CORONARY STENT INTERVENTION;  Surgeon: Nigel Mormon, MD;  Location: [place] [facility];  Service: Cardiovascular;  Laterality: N/A;  . ESOPHAGOGASTRODUODENOSCOPY N/A 06/04/2014   Procedure: ESOPHAGOGASTRODUODENOSCOPY (EGD);  Surgeon: Jerene Bears, MD;  Location: [facility] END[facility]COPY;  Service: Endoscopy;  Laterality: N/A;  . INTRAVASCULAR IMAGING/OCT N/A 01/30/2021   Procedure: INTRAVASCULAR IMAGING/OCT;  Surgeon: Nigel Mormon, MD;  Location: [place] [facility];  Service: Cardiovascular;  Laterality: N/A;  . LEFT HEART CATH AND CORONARY ANGIOGRAPHY N/A 01/30/2021   Procedure: LEFT HEART CATH AND CORONARY ANGIOGRAPHY;  Surgeon: Nigel Mormon, MD;  Location: [place] [facility];  Service: Cardiovascular;  Laterality: N/A;    FAMILY HISTORY: The patient family history includes Hypertension in his father and mother.  SOCIAL HISTORY:  The patient  reports that he has never smoked. He has never used smokeless tobacco. He reports previous alcohol use. He reports that he does not use drugs.  REVIEW OF SYSTEMS: Review of Systems  Constitutional: Negative for chills, fever and malaise/fatigue.  HENT: Negative for hoarse voice and nosebleeds.   Eyes: Negative for discharge, double vision and pain.  Cardiovascular: Positive for dyspnea on exertion (improving). Negative for chest pain, claudication, leg swelling, near-syncope, orthopnea, palpitations, paroxysmal nocturnal dyspnea and syncope.  Respiratory: Positive for shortness of breath (improving). Negative for hemoptysis.   Musculoskeletal: Negative for muscle cramps and myalgias.  Gastrointestinal: Negative for abdominal pain, constipation, diarrhea, hematemesis, hematochezia, melena, nausea and vomiting.  Neurological: Negative for dizziness and light-headedness.    PHYSICAL EXAM: Vitals with BMI 03/30/2021 01/30/2021 01/30/2021  Height '5\' 6"'  - -  Weight 234 lbs 6 oz - -  BMI 09.32 - -  Systolic 671 245 809  Diastolic 77 71 49  Pulse 61 59 54   CONSTITUTIONAL: Well-developed and well-nourished. No acute distress.  SKIN: Skin is warm and dry. No rash noted. No cyanosis. No pallor. No jaundice HEAD: Normocephalic and atraumatic.  EYES: No scleral icterus MOUTH/THROAT: Moist  oral membranes.  NECK: No JVD present. No thyromegaly noted. No carotid bruits  LYMPHATIC: No visible cervical adenopathy.  CHEST Normal respiratory effort. No intercostal retractions  LUNGS: Clear to auscultation bilaterally.  No stridor. No wheezes. No rales.  CARDIOVASCULAR: Regular rate and rhythm, positive S1-S2, no murmurs rubs or gallops appreciated ABDOMINAL: Obese, soft, nontender, nondistended, positive bowel sounds in all 4 quadrants no apparent ascites.  EXTREMITIES: No peripheral edema  HEMATOLOGIC: No significant bruising NEUROLOGIC: Oriented to person, place, and time. Nonfocal. Normal muscle tone.  PSYCHIATRIC: Normal mood and affect. Normal behavior. Cooperative  CARDIAC DATABASE: EKG: 03/30/2021: Sinus bradycardia, 58 bpm, normal axis, nonspecific T wave abnormality.   Echocardiogram: 03/06/2021:  Left ventricle cavity is normal in size and wall thickness. Normal global wall motion. Normal LV systolic function with EF 67%. Normal diastolic filling pattern.  No significant valvular abnormality.  Normal right atrial pressure.    Stress Testing: No results found for this or any previous visit from the past 1095 days.   Heart Catheterization: 01/30/2021: LM: Normal LAD: Proximal 90% stenosis         Diagonal 1 proximal  60% stenosis         Distal apical 90% stenosis in a small caliber vessel LCx: Normal RCA: Distal 20%, RPDA 20% stenosis  LABORATORY DATA: CBC Latest Ref Rng & Units 01/30/2021 09/29/2017 10/31/2014  WBC 4.0 - 10.5 K/uL 4.7 3.9(L) 3.4(L)  Hemoglobin 13.0 - 17.0 g/dL 15.2 15.0 14.4  Hematocrit 39.0 - 52.0 % 44.5 44.0 42.5  Platelets 150 - 400 K/uL 189 152 128(L)    CMP Latest Ref Rng & Units 01/26/2021 09/29/2017 10/09/2014  Glucose 65 - 99 mg/dL 125(H) 123(H) 108(H)  BUN 6 - 24 mg/dL '14 12 11  ' Creatinine 0.76 - 1.27 mg/dL 0.93 0.91 0.91  Sodium 134 - 144 mmol/L 142 138 137  Potassium 3.5 - 5.2 mmol/L 4.4 3.8 4.3  Chloride 96 - 106 mmol/L 103 103 96   CO2 20 - 29 mmol/L '24 27 23  ' Calcium 8.7 - 10.2 mg/dL 9.4 9.7 10.2  Total Protein 6.0 - 8.3 g/dL - - 8.4(H)  Total Bilirubin 0.3 - 1.2 mg/dL - - 0.8  Alkaline Phos 39 - 117 U/L - - 47  AST 0 - 37 U/L - - 18  ALT 0 - 53 U/L - - 19    Lipid Panel  No results found for: CHOL, TRIG, HDL, CHOLHDL, VLDL, LDLCALC, LDLDIRECT, LABVLDL  No components found for: NTPROBNP  Recent Labs    01/26/21 1422  PROBNP 95   No results for input(s): TSH in the last 8760 hours.  BMP Recent Labs    01/26/21 1422  NA 142  K 4.4  CL 103  CO2 24  GLUCOSE 125*  BUN 14  CREATININE 0.93  CALCIUM 9.4    HEMOGLOBIN A1C No results found for: HGBA1C, MPG  External Labs: Collected: 01/13/2021, provided by PCP Hemoglobin 14.1 g/dL, hematocrit 41.1% Platelets 147 Creatinine 0.84 mg/dL. eGFR: 99 mL/min per 1.73 m Potassium 3.8 AST 21, ALT 27, alkaline phosphatase 53 Hemoglobin A1c: 6.1  IMPRESSION:    ICD-10-CM   1. Atherosclerosis of native coronary artery of native heart without angina pectoris  I25.10 AMB referral to cardiac rehabilitation  2. History of coronary angioplasty with insertion of stent  Z95.5 EKG 12-Lead    AMB referral to cardiac rehabilitation  3. Dyspnea on exertion  R06.00   4. Benign essential hypertension  I10   5. Mixed hyperlipidemia  E78.2 CMP14+EGFR    Lipid Panel With LDL/HDL Ratio    LDL cholesterol, direct  6. Class 2 severe obesity due to excess calories with serious comorbidity and body mass index (BMI) of 37.0 to 37.9 in adult (HCC)  E66.01    Z68.37   7. OSA (obstructive sleep apnea)  G47.33      RECOMMENDATIONS: Joseph Fisher is a 55 y.o. male whose past medical history and cardiac risk factors include:  CAD s/p PCI,  Hypertension, OSA not on CPAP, hyperlipidemia, obesity due to excess calories.  Atherosclerosis of the native coronary arteries without angina pectoris status post angioplasty/PCI: Patient underwent left heart catheterization 01/30/2021  and underwent PCI to the LAD. Doing well post procedure. Continue dual antiplatelet therapy. Medications reconciled. Cardiac rehab referral provided as patient would like to uptitrate his physical activity. EKG reviewed and findings noted above. Educated on the importance of secondary prevention. -Blood pressure within acceptable range. -Check fasting lipid profile to reevaluate statin therapy. -Educated importance of glycemic control -Educated on importance of weight loss, dietary restrictions, and a low-salt diet -Encouraged to follow-up with his sleep medicine provider  for reevaluation of his sleep apnea and to be  fitted for a different mask to improve compliance.  Dyspnea on exertion: Chronic and stable. Echocardiogram and Heart catheterization results reviewed Educated on importance of increasing physical activity as tolerated, initiation of cardiac rehab, and discussing possibly seeing weight loss clinic or a dietitian at the discretion of his PCP. Clinically euvolemic and not in congestive heart failure.  Blood pressures are very well controlled.  BNP within normal limits  Benign essential hypertension: Office blood pressures within acceptable range. Medications reconciled. Low-salt diet recommended.  Mixed hyperlipidemia: Continue Crestor 10 mg p.o. nightly. We will check a fasting lipid profile at the next office visit.  FINAL MEDICATION LIST END OF ENCOUNTER: No orders of the defined types were placed in this encounter.   Medications Discontinued During This Encounter  Medication Reason  . ticagrelor (BRILINTA) 90 MG TABS tablet Patient Preference     Current Outpatient Medications:  .  acetaminophen (TYLENOL) 325 MG tablet, Take 650 mg by mouth every 6 (six) hours as needed for moderate pain., Disp: , Rfl:  .  amitriptyline (ELAVIL) 50 MG tablet, Take 50 mg by mouth at bedtime., Disp: , Rfl:  .  amLODipine (NORVASC) 5 MG tablet, Take 5 mg by mouth daily., Disp: ,  Rfl:  .  aspirin EC 81 MG tablet, Take 1 tablet (81 mg total) by mouth daily. Swallow whole., Disp: , Rfl:  .  clopidogrel (PLAVIX) 75 MG tablet, Take 75 mg by mouth daily., Disp: , Rfl:  .  nebivolol (BYSTOLIC) 10 MG tablet, Take 1 tablet (10 mg total) by mouth every evening., Disp: 30 tablet, Rfl: 0 .  nitroGLYCERIN (NITROSTAT) 0.4 MG SL tablet, PLACE 1 TAB UNDER TONGUE EVERY 5 MINS AS NEEDED FOR CHEST PAIN. GO TO ER IS MORE THAN 2 TABS NEEDED, Disp: 25 tablet, Rfl: 2 .  omeprazole (PRILOSEC) 40 MG capsule, TAKE 1 CAPSULE BY MOUTH 2 TIMES DAILY BEFORE A MEAL. NEEDS OFFICE VISIT FOR FURTHER REFILLS (Patient taking differently: Take 40 mg by mouth in the morning and at bedtime.), Disp: 180 capsule, Rfl: 0 .  oxyCODONE-acetaminophen (PERCOCET) 10-325 MG tablet, Take 1 tablet by mouth every 6 (six) hours as needed for pain., Disp: , Rfl: 0 .  ranolazine (RANEXA) 500 MG 12 hr tablet, TAKE 1 TABLET BY MOUTH TWICE A DAY, Disp: 60 tablet, Rfl: 0 .  rosuvastatin (CRESTOR) 10 MG tablet, Take 10 mg by mouth at bedtime., Disp: , Rfl:  .  valsartan-hydrochlorothiazide (DIOVAN-HCT) 320-12.5 MG tablet, Take 1 tablet by mouth daily., Disp: , Rfl:   Orders Placed This Encounter  Procedures  . CMP14+EGFR  . Lipid Panel With LDL/HDL Ratio  . LDL cholesterol, direct  . AMB referral to cardiac rehabilitation  . EKG 12-Lead    There are no Patient Instructions on file for this visit.   --Continue cardiac medications as reconciled in final medication list. --Return in about 6 months (around 09/29/2021) for Follow up, CAD. Or sooner if needed. --Continue follow-up with your primary care physician regarding the management of your other chronic comorbid conditions.  Patient's questions and concerns were addressed to his satisfaction. He voices understanding of the instructions provided during this encounter.   This note was created using a voice recognition software as a result there may be grammatical errors  inadvertently enclosed that do not reflect the nature of this encounter. Every attempt is made to correct such errors.  Rex Kras, [place], [facility]  Pager: [phone] Office: [phone]

## 2021-04-02 ENCOUNTER — Telehealth: Payer: Self-pay | Admitting: *Deleted

## 2021-04-02 ENCOUNTER — Institutional Professional Consult (permissible substitution): Payer: Medicare Other | Admitting: Neurology

## 2021-04-02 NOTE — Telephone Encounter (Signed)
No showed consult appointment.

## 2021-04-09 ENCOUNTER — Telehealth (HOSPITAL_COMMUNITY): Payer: Self-pay

## 2021-04-09 NOTE — Telephone Encounter (Signed)
Pt called wanting to schedule for cardiac rehab, I advised pt that we are scheduling out until July right now and that the July schedule is not complete as of right now.and that we would call him once we can. I advised pt that we have a backlog it could possibly be 2-4 months out pt understood.

## 2021-04-13 ENCOUNTER — Other Ambulatory Visit: Payer: Self-pay | Admitting: Cardiology

## 2021-04-13 DIAGNOSIS — I209 Angina pectoris, unspecified: Secondary | ICD-10-CM

## 2021-05-02 ENCOUNTER — Encounter (HOSPITAL_COMMUNITY): Payer: Self-pay

## 2021-05-02 NOTE — Telephone Encounter (Signed)
Attempted to call patient in regards to Cardiac Rehab - LM on VM Mailed letter 

## 2021-05-03 ENCOUNTER — Other Ambulatory Visit: Payer: Self-pay | Admitting: Cardiology

## 2021-05-03 DIAGNOSIS — I209 Angina pectoris, unspecified: Secondary | ICD-10-CM

## 2021-05-18 ENCOUNTER — Telehealth (HOSPITAL_COMMUNITY): Payer: Self-pay

## 2021-05-18 NOTE — Telephone Encounter (Addendum)
No repsonse from pt.  Closed referral  

## 2021-05-30 ENCOUNTER — Telehealth (HOSPITAL_COMMUNITY): Payer: Self-pay

## 2021-05-30 NOTE — Telephone Encounter (Deleted)
Patient called and is interested in participating in the Cardiac Rehab Program. Patient will come in for orientation on 06/14/2021'@2'$ :15pm and will attend the 11:00am exercise class.   Tourist information centre manager.

## 2021-05-30 NOTE — Telephone Encounter (Signed)
Patient called and is interested in participating in the Cardiac Rehab Program. Patient will come in for orientation on 06/21/2021'@2'$ :15pm and will attend the 11:00am exercise class.   Mailed package

## 2021-06-08 ENCOUNTER — Other Ambulatory Visit: Payer: Self-pay | Admitting: Cardiology

## 2021-06-08 DIAGNOSIS — I209 Angina pectoris, unspecified: Secondary | ICD-10-CM

## 2021-06-14 ENCOUNTER — Ambulatory Visit (HOSPITAL_COMMUNITY): Payer: Medicare Other

## 2021-06-18 ENCOUNTER — Telehealth: Payer: Self-pay

## 2021-06-18 ENCOUNTER — Other Ambulatory Visit: Payer: Self-pay

## 2021-06-18 MED ORDER — ROSUVASTATIN CALCIUM 20 MG PO TABS: 20.0000 mg | ORAL_TABLET | Freq: Every day | ORAL | 1 refills | Status: DC

## 2021-06-18 NOTE — Telephone Encounter (Signed)
You ordered labs and released them right?

## 2021-06-18 NOTE — Telephone Encounter (Signed)
Patient called and stated that he was advised to increase rosuvastatin to '20mg'$ , taking two tablets daily. He ran out and wanted a refill. I did not see in the chart about the increase, but spoke with the provider, he advised for patient to take Rosuvastatin '20mg'$  [facility] and for patient to labs done 1 week before next appointment 10/04/2021, patient aware.

## 2021-06-20 ENCOUNTER — Telehealth (HOSPITAL_COMMUNITY): Payer: Self-pay | Admitting: *Deleted

## 2021-06-20 NOTE — Telephone Encounter (Signed)
Joseph Fisher returned my call. He confirms that he is coming for his CR orientation appointment tomorrow. I completed his health history.We discussed Covid precautions, wearing mask, proper shoes, directions to the department and our phone number if he needed to contact us. He voices understanding.

## 2021-06-20 NOTE — Telephone Encounter (Signed)
Called Joseph Fisher to give his reminder of his CR orientation appointment. I was not able to reach him, but I left message for him to return my call.

## 2021-06-21 ENCOUNTER — Other Ambulatory Visit: Payer: Self-pay

## 2021-06-21 ENCOUNTER — Encounter (HOSPITAL_COMMUNITY)
Admission: RE | Admit: 2021-06-21 | Discharge: 2021-06-21 | Disposition: A | Discharge status: Home / Self Care | Payer: Medicare Other | Source: Ambulatory Visit | Attending: Cardiology | Admitting: Cardiology

## 2021-06-21 VITALS — BP 122/74 | HR 61 | Ht 67.25 in | Wt 232.8 lb

## 2021-06-21 DIAGNOSIS — Z955 Presence of coronary angioplasty implant and graft: Secondary | ICD-10-CM | POA: Insufficient documentation

## 2021-06-21 HISTORY — DX: Atherosclerotic heart disease of native coronary artery without angina pectoris: I25.10

## 2021-06-21 NOTE — Progress Notes (Signed)
Cardiac Rehab Medication Review by a Nurse  Does the patient  feel that his/her medications are working for him/her?  yes  Has the patient been experiencing any side effects to the medications prescribed?  no  Does the patient measure his/her own blood pressure or blood glucose at home?  no   Does the patient have any problems obtaining medications due to transportation or finances?   no  Understanding of regimen: good Understanding of indications: good Potential of compliance: good    Nurse comments: Joseph Fisher is taking his medications as prescribed and has a good understanding of what his medications are for. Joseph Fisher does not have a BP monitor/ cuff at home. Joseph Fisher says he sometimes checks his bP at the drug or grocery store.    Joseph Gave RN BSN 06/21/2021 2:54 PM

## 2021-06-22 ENCOUNTER — Encounter (HOSPITAL_COMMUNITY): Payer: Self-pay

## 2021-06-22 NOTE — Progress Notes (Signed)
Cardiac Individual Treatment Plan  Patient Details  Name: Joseph Fisher MRN: [id number] Date of Birth: 04/03/1966 Referring Provider:   Flowsheet Row CARDIAC REHAB PHASE II ORIENTATION from 06/21/2021 in [place]  Referring Provider Dr Rex Kras, DO       Initial Encounter Date:  [place] from 06/21/2021 in [place]  Date 06/21/21       Visit Diagnosis: 01/30/21 S/P DES LAD  Patient's Home Medications on Admission:  Current Outpatient Medications:    acetaminophen (TYLENOL) 325 MG tablet, Take 650 mg by mouth every 6 (six) hours as needed for moderate pain., Disp: , Rfl:    amLODipine (NORVASC) 5 MG tablet, Take 5 mg by mouth daily., Disp: , Rfl:    clopidogrel (PLAVIX) 75 MG tablet, Take 75 mg by mouth daily., Disp: , Rfl:    gabapentin (NEURONTIN) 300 MG capsule, Take 300 mg by mouth in the morning, at noon, and at bedtime., Disp: , Rfl:    nebivolol (BYSTOLIC) 10 MG tablet, Take 1 tablet (10 mg total) by mouth every evening., Disp: 30 tablet, Rfl: 0   nitroGLYCERIN (NITROSTAT) 0.4 MG SL tablet, PLACE 1 TAB UNDER TONGUE EVERY 5 MINS AS NEEDED FOR CHEST PAIN. GO TO ER IS MORE THAN 2 TABS NEEDED (Patient taking differently: Place 0.4 mg under the tongue every 5 (five) minutes as needed for chest pain.), Disp: 25 tablet, Rfl: 2   omeprazole (PRILOSEC) 40 MG capsule, TAKE 1 CAPSULE BY MOUTH 2 TIMES DAILY BEFORE A MEAL. NEEDS OFFICE VISIT FOR FURTHER REFILLS (Patient taking differently: Take 40 mg by mouth in the morning and at bedtime.), Disp: 180 capsule, Rfl: 0   oxyCODONE-acetaminophen (PERCOCET) 10-325 MG tablet, Take 1 tablet by mouth every 6 (six) hours as needed for pain., Disp: , Rfl: 0   ranolazine (RANEXA) 500 MG 12 hr tablet, TAKE 1 TABLET BY MOUTH TWICE A DAY, Disp: 60 tablet, Rfl: 0   valsartan-hydrochlorothiazide (DIOVAN-HCT) 320-12.5 MG tablet, Take 1 tablet by  mouth daily., Disp: , Rfl:    rosuvastatin (CRESTOR) 20 MG tablet, Take 1 tablet (20 mg total) by mouth at bedtime., Disp: 90 tablet, Rfl: 1  Past Medical History: Past Medical History:  Diagnosis Date   Anemia, unspecified 07/14/2014   Barrett's esophagus    Blood transfusion without reported diagnosis 05/2014   Cervical stenosis of spine    Chronic back pain    Coronary artery disease    Diverticulosis    External hemorrhoids    Gastric ulcer    GERD (gastroesophageal reflux disease)    GI bleed    Hemorrhagic shock (HCC)    Hyperlipidemia    Hypertension    Hypoxemia 06/20/2014   Insomnia    OSA (obstructive sleep apnea) 01/06/2019   Peptic ulcer    Pneumonia    RLS (restless legs syndrome) 06/20/2014   Snoring 06/20/2014   Unspecified deficiency anemia 07/14/2014    Tobacco Use: Social History   Tobacco Use  Smoking Status Never  Smokeless Tobacco Never    Labs: Recent Review Flowsheet Data     Labs for ITP Cardiac and Pulmonary Rehab Latest Ref Rng & Units 08/30/2011 05/28/2014 06/04/2014   TCO2 0 - 100 mmol/L '23 19 22       ' Capillary Blood Glucose: No results found for: GLUCAP   Exercise Target Goals: Exercise Program Goal: Individual exercise prescription set using results from initial 6 min  walk test and THRR while considering  patient's activity barriers and safety.   Exercise Prescription Goal: Starting with aerobic activity 30 plus minutes a day, 3 days per week for initial exercise prescription. Provide home exercise prescription and guidelines that participant acknowledges understanding prior to discharge.  Activity Barriers & Risk Stratification:  Activity Barriers & Cardiac Risk Stratification - 06/21/21 1556       Activity Barriers & Cardiac Risk Stratification   Activity Barriers Neck/Spine Problems;Back Problems    Cardiac Risk Stratification High   under 5 MET's            6 Minute Walk:  6 Minute Walk     Row Name 06/21/21 1551          6 Minute Walk   Phase Initial     Distance 1910 feet     Walk Time 6 minutes     # of Rest Breaks 0     MPH 3.62     METS 4.5     RPE 9     Perceived Dyspnea  0     VO2 Peak 15.75     Symptoms No     Resting HR 61 bpm     Resting BP 122/74     Resting Oxygen Saturation  97 %     Exercise Oxygen Saturation  during 6 min walk 99 %     Max Ex. HR 96 bpm     Max Ex. BP 160/82     2 Minute Post BP 150/90              Oxygen Initial Assessment:   Oxygen Re-Evaluation:   Oxygen Discharge (Final Oxygen Re-Evaluation):   Initial Exercise Prescription:  Initial Exercise Prescription - 06/21/21 1500       Date of Initial Exercise RX and Referring Provider   Date 06/21/21    Referring Provider Dr Rex Kras, DO    Expected Discharge Date 08/10/21      Treadmill   MPH 2.7    Grade 1    Minutes 15    METs 3.44      NuStep   Level 3    SPM 80    Minutes 15    METs 2.8      Prescription Details   Frequency (times per week) 3    Duration Progress to 30 minutes of continuous aerobic without signs/symptoms of physical distress      Intensity   THRR 40-80% of Max Heartrate 66-133    Ratings of Perceived Exertion 11-13    Perceived Dyspnea 0-4      Progression   Progression Continue progressive overload as per policy without signs/symptoms or physical distress.      Resistance Training   Training Prescription Yes    Weight 4    Reps 10-15             Perform Capillary Blood Glucose checks as needed.  Exercise Prescription Changes:   Exercise Comments:   Exercise Goals and Review:   Exercise Goals     Row Name 06/21/21 1604             Exercise Goals   Increase Physical Activity Yes       Intervention Provide advice, education, support and counseling about physical activity/exercise needs.;Develop an individualized exercise prescription for aerobic and resistive training based on initial evaluation findings, risk stratification,  comorbidities and participant's personal goals.       Expected Outcomes Short  Term: Attend rehab on a regular basis to increase amount of physical activity.;Long Term: Add in home exercise to make exercise part of routine and to increase amount of physical activity.;Long Term: Exercising regularly at least 3-5 days a week.       Increase Strength and Stamina Yes       Intervention Provide advice, education, support and counseling about physical activity/exercise needs.;Develop an individualized exercise prescription for aerobic and resistive training based on initial evaluation findings, risk stratification, comorbidities and participant's personal goals.       Expected Outcomes Short Term: Increase workloads from initial exercise prescription for resistance, speed, and METs.;Short Term: Perform resistance training exercises routinely during rehab and add in resistance training at home;Long Term: Improve cardiorespiratory fitness, muscular endurance and strength as measured by increased METs and functional capacity (6MWT)       Able to understand and use rate of perceived exertion (RPE) scale Yes       Intervention Provide education and explanation on how to use RPE scale       Expected Outcomes Short Term: Able to use RPE daily in rehab to express subjective intensity level;Long Term:  Able to use RPE to guide intensity level when exercising independently       Knowledge and understanding of Target Heart Rate Range (THRR) Yes       Intervention Provide education and explanation of THRR including how the numbers were predicted and where they are located for reference       Expected Outcomes Short Term: Able to state/look up THRR;Long Term: Able to use THRR to govern intensity when exercising independently;Short Term: Able to use daily as guideline for intensity in rehab       Understanding of Exercise Prescription Yes       Intervention Provide education, explanation, and written materials on patient's  individual exercise prescription       Expected Outcomes Short Term: Able to explain program exercise prescription;Long Term: Able to explain home exercise prescription to exercise independently                Exercise Goals Re-Evaluation :    Discharge Exercise Prescription (Final Exercise Prescription Changes):   Nutrition:  Target Goals: Understanding of nutrition guidelines, daily intake of sodium <1573m, cholesterol <206m calories 30% from fat and 7% or less from saturated fats, daily to have 5 or more servings of fruits and vegetables.  Biometrics:  Pre Biometrics - 06/21/21 1604       Pre Biometrics   Waist Circumference 47.5 inches    Hip Circumference 44.25 inches    Waist to Hip Ratio 1.07 %    Triceps Skinfold 24 mm    % Body Fat 35.9 %    Grip Strength 36 kg    Flexibility 13.75 in    Single Leg Stand 30 seconds              Nutrition Therapy Plan and Nutrition Goals:   Nutrition Assessments:  MEDIFICTS Score Key: ?70 Need to make dietary changes  40-70 Heart Healthy Diet ? 40 Therapeutic Level Cholesterol Diet   Picture Your Plate Scores: <4<98nhealthy dietary pattern with much room for improvement. 41-50 Dietary pattern unlikely to meet recommendations for good health and room for improvement. 51-60 More healthful dietary pattern, with some room for improvement.  >60 Healthy dietary pattern, although there may be some specific behaviors that could be improved.    Nutrition Goals Re-Evaluation:   Nutrition Goals Discharge (Final  Nutrition Goals Re-Evaluation):   Psychosocial: Target Goals: Acknowledge presence or absence of significant depression and/or stress, maximize coping skills, provide positive support system. Participant is able to verbalize types and ability to use techniques and skills needed for reducing stress and depression.  Initial Review & Psychosocial Screening:  Initial Psych Review & Screening - 06/21/21 1459        Initial Review   Current issues with None Identified      Family Dynamics   Good Support System? Yes   Hercules lives with his sister who he has for support     Barriers   Psychosocial barriers to participate in program There are no identifiable barriers or psychosocial needs.      Screening Interventions   Interventions Encouraged to exercise             Quality of Life Scores:  Quality of Life - 06/21/21 1613       Quality of Life   Select Quality of Life      Quality of Life Scores   Health/Function Pre 28.4 %    Socioeconomic Pre 30 %    Psych/Spiritual Pre 30 %    Family Pre 30 %    GLOBAL Pre 29.31 %            Scores of 19 and below usually indicate a poorer quality of life in these areas.  A difference of  2-3 points is a clinically meaningful difference.  A difference of 2-3 points in the total score of the Quality of Life Index has been associated with significant improvement in overall quality of life, self-image, physical symptoms, and general health in studies assessing change in quality of life.  PHQ-9: Recent Review Flowsheet Data     Depression screen [facility] 2/9 06/21/2021   Decreased Interest 0   Down, Depressed, Hopeless 0   PHQ - 2 Score 0      Interpretation of Total Score  Total Score Depression Severity:  1-4 = Minimal depression, 5-9 = Mild depression, 10-14 = Moderate depression, 15-19 = Moderately severe depression, 20-27 = Severe depression   Psychosocial Evaluation and Intervention:   Psychosocial Re-Evaluation:   Psychosocial Discharge (Final Psychosocial Re-Evaluation):   Vocational Rehabilitation: Provide vocational rehab assistance to qualifying candidates.   Vocational Rehab Evaluation & Intervention:  Vocational Rehab - 06/21/21 1500       Initial Vocational Rehab Evaluation & Intervention   Assessment shows need for Vocational Rehabilitation No   Pat is retired and does not need vocational rehab at this time             Education: Education Goals: Education classes will be provided on a weekly basis, covering required topics. Participant will state understanding/return demonstration of topics presented.  Learning Barriers/Preferences:  Learning Barriers/Preferences - 06/21/21 1614       Learning Barriers/Preferences   Learning Barriers None    Learning Preferences Audio;Computer/Internet;Pictoral;Verbal Instruction;Video;Written Material             Education Topics: Hypertension, Hypertension Reduction -Define heart disease and high blood pressure. Discus how high blood pressure affects the body and ways to reduce high blood pressure.   Exercise and Your Heart -Discuss why it is important to exercise, the FITT principles of exercise, normal and abnormal responses to exercise, and how to exercise safely.   Angina -Discuss definition of angina, causes of angina, treatment of angina, and how to decrease risk of having angina.   Cardiac Medications -Review what the following  cardiac medications are used for, how they affect the body, and side effects that may occur when taking the medications.  Medications include Aspirin, Beta blockers, calcium channel blockers, ACE Inhibitors, angiotensin receptor blockers, diuretics, digoxin, and antihyperlipidemics.   Congestive Heart Failure -Discuss the definition of CHF, how to live with CHF, the signs and symptoms of CHF, and how keep track of weight and sodium intake.   Heart Disease and Intimacy -Discus the effect sexual activity has on the heart, how changes occur during intimacy as we age, and safety during sexual activity.   Smoking Cessation / COPD -Discuss different methods to quit smoking, the health benefits of quitting smoking, and the definition of COPD.   Nutrition I: Fats -Discuss the types of cholesterol, what cholesterol does to the heart, and how cholesterol levels can be controlled.   Nutrition II: Labels -Discuss  the different components of food labels and how to read food label   Heart Parts/Heart Disease and PAD -Discuss the anatomy of the heart, the pathway of blood circulation through the heart, and these are affected by heart disease.   Stress I: Signs and Symptoms -Discuss the causes of stress, how stress may lead to anxiety and depression, and ways to limit stress.   Stress II: Relaxation -Discuss different types of relaxation techniques to limit stress.   Warning Signs of Stroke / TIA -Discuss definition of a stroke, what the signs and symptoms are of a stroke, and how to identify when someone is having stroke.   Knowledge Questionnaire Score:  Knowledge Questionnaire Score - 06/21/21 1614       Knowledge Questionnaire Score   Pre Score 15/24             Core Components/Risk Factors/Patient Goals at Admission:  Personal Goals and Risk Factors at Admission - 06/21/21 1615       Core Components/Risk Factors/Patient Goals on Admission    Weight Management Yes;Obesity;Weight Loss    Intervention Weight Management: Develop a combined nutrition and exercise program designed to reach desired caloric intake, while maintaining appropriate intake of nutrient and fiber, sodium and fats, and appropriate energy expenditure required for the weight goal.;Weight Management: Provide education and appropriate resources to help participant work on and attain dietary goals.;Weight Management/Obesity: Establish reasonable short term and long term weight goals.;Obesity: Provide education and appropriate resources to help participant work on and attain dietary goals.    Admit Weight 232 lb 12.9 oz (105.6 kg)    Expected Outcomes Short Term: Continue to assess and modify interventions until short term weight is achieved;Long Term: Adherence to nutrition and physical activity/exercise program aimed toward attainment of established weight goal;Weight Loss: Understanding of general recommendations for a  balanced deficit meal plan, which promotes 1-2 lb weight loss per week and includes a negative energy balance of [phone] kcal/d;Understanding recommendations for meals to include 15-35% energy as protein, 25-35% energy from fat, 35-60% energy from carbohydrates, less than 259m of dietary cholesterol, 20-35 gm of total fiber daily;Understanding of distribution of calorie intake throughout the day with the consumption of 4-5 meals/snacks    Hypertension Yes    Intervention Provide education on lifestyle modifcations including regular physical activity/exercise, weight management, moderate sodium restriction and increased consumption of fresh fruit, vegetables, and low fat dairy, alcohol moderation, and smoking cessation.;Monitor prescription use compliance.    Expected Outcomes Short Term: Continued assessment and intervention until BP is < 140/9107mHG in hypertensive participants. < 130/8056mG in hypertensive participants with diabetes, heart failure  or chronic kidney disease.;Long Term: Maintenance of blood pressure at goal levels.    Lipids Yes    Intervention Provide education and support for participant on nutrition & aerobic/resistive exercise along with prescribed medications to achieve LDL <57m, HDL >441m    Expected Outcomes Short Term: Participant states understanding of desired cholesterol values and is compliant with medications prescribed. Participant is following exercise prescription and nutrition guidelines.;Long Term: Cholesterol controlled with medications as prescribed, with individualized exercise RX and with personalized nutrition plan. Value goals: LDL < 7086mHDL > 40 mg.    Stress Yes    Intervention Offer individual and/or small group education and counseling on adjustment to heart disease, stress management and health-related lifestyle change. Teach and support self-help strategies.;Refer participants experiencing significant psychosocial distress to appropriate mental health  specialists for further evaluation and treatment. When possible, include family members and significant others in education/counseling sessions.    Expected Outcomes Short Term: Participant demonstrates changes in health-related behavior, relaxation and other stress management skills, ability to obtain effective social support, and compliance with psychotropic medications if prescribed.;Long Term: Emotional wellbeing is indicated by absence of clinically significant psychosocial distress or social isolation.    Personal Goal Other Yes    Personal Goal Short and long the same: gain strength, endurance, lose wt, get into shape. Get into regular ex routine    Intervention Will continue to monitor pt and progress workloads as tolerated without sign or symptom    Expected Outcomes Pt will achieve his goals             Core Components/Risk Factors/Patient Goals Review:    Core Components/Risk Factors/Patient Goals at Discharge (Final Review):    ITP Comments:  ITP Comments     Row Name 06/21/21 1453           ITP Comments Dr TraFransico Him, Medical Director                Comments: RanGorgetended orientation on 06/21/21 to review rules and guidelines for program.  Completed 6 minute walk test, Intitial ITP, and exercise prescription.  VSS. Telemetry-Sinus Rhythm.  Post 6 minute walk test BP 160/82 heart rate 96. Recheck BP 150/90 heart rate 71 Exit BP 124/68. Will continue to monitor BP. Safety measures and social distancing in place per CDC guidelines. MarBarnet PallN,BSN 06/22/2021 7:32 AM

## 2021-06-25 ENCOUNTER — Other Ambulatory Visit: Payer: Self-pay

## 2021-06-25 ENCOUNTER — Encounter (HOSPITAL_COMMUNITY)
Admission: RE | Admit: 2021-06-25 | Discharge: 2021-06-25 | Disposition: A | Discharge status: Home / Self Care | Payer: Medicare Other | Source: Ambulatory Visit | Attending: Cardiology | Admitting: Cardiology

## 2021-06-25 DIAGNOSIS — Z955 Presence of coronary angioplasty implant and graft: Secondary | ICD-10-CM

## 2021-06-25 NOTE — Progress Notes (Signed)
Daily Session Note  Patient Details  Name: Joseph Fisher MRN: [id number] Date of Birth: 03/13/1966 Referring Provider:   Flowsheet Row CARDIAC REHAB PHASE II ORIENTATION from 06/21/2021 in [place]  Referring Provider Dr Rex Kras, DO       Encounter Date: 06/25/2021  Check In:  Session Check In - 06/25/21 1119       Check-In   Supervising physician immediately available to respond to emergencies [facility] immediately available    Physician(s) Dr. Doristine Bosworth    Location [facility]    Staff Present Maurice Small, RN, BSN;Jetta Walker BS, ACSM EP-C, Exercise Physiologist;Olinty Celesta Aver, MS, ACSM CEP, Exercise Physiologist;David Boykin, MS, ACSM-CEP, CCRP, Exercise Physiologist    Virtual Visit No    Medication changes reported     No    Fall or balance concerns reported    No    Tobacco Cessation No Change    Warm-up and Cool-down Performed on first and last piece of equipment    Resistance Training Performed Yes    VAD Patient? No    PAD/SET Patient? No      Pain Assessment   Currently in Pain? No/denies    Pain Score 0-No pain    Multiple Pain Sites No             Capillary Blood Glucose: No results found for this or any previous visit (from the past 24 hour(s)).    Social History   Tobacco Use  Smoking Status Never  Smokeless Tobacco Never    Goals Met:  Exercise tolerated well No report of concerns or symptoms today  Goals Unmet:  Not Applicable  Comments: Pt started cardiac rehab today.  Pt tolerated light exercise without difficulty. VSS, telemetry-SR, asymptomatic.  Medication list reconciled. Pt denies barriers to medication compliance.  PSYCHOSOCIAL ASSESSMENT:  PHQ-0 per screening completed on 8/26. Pt exhibits positive coping skills, hopeful outlook with supportive family. No psychosocial needs identified at this time, no psychosocial interventions necessary.  Pt oriented to exercise  equipment and routine. Understanding verbalized.    Dr. Fransico Him is Medical Director for Cardiac Rehab at [facility]

## 2021-06-27 ENCOUNTER — Encounter (HOSPITAL_COMMUNITY)
Admission: RE | Admit: 2021-06-27 | Discharge: 2021-06-27 | Disposition: A | Discharge status: Home / Self Care | Payer: Medicare Other | Source: Ambulatory Visit | Attending: Cardiology | Admitting: Cardiology

## 2021-06-27 ENCOUNTER — Other Ambulatory Visit: Payer: Self-pay

## 2021-06-27 DIAGNOSIS — Z955 Presence of coronary angioplasty implant and graft: Secondary | ICD-10-CM

## 2021-06-29 ENCOUNTER — Encounter (HOSPITAL_COMMUNITY): Payer: Medicare Other

## 2021-06-29 ENCOUNTER — Telehealth (HOSPITAL_COMMUNITY): Payer: Self-pay | Admitting: *Deleted

## 2021-06-29 ENCOUNTER — Other Ambulatory Visit: Payer: Self-pay | Admitting: Cardiology

## 2021-06-29 DIAGNOSIS — I209 Angina pectoris, unspecified: Secondary | ICD-10-CM

## 2021-06-29 NOTE — Telephone Encounter (Signed)
Patient called to say he will be absent from cardiac rehab today.

## 2021-07-04 ENCOUNTER — Encounter (HOSPITAL_COMMUNITY): Payer: Medicare Other

## 2021-07-06 ENCOUNTER — Encounter (HOSPITAL_COMMUNITY): Payer: Medicare Other

## 2021-07-09 ENCOUNTER — Encounter (HOSPITAL_COMMUNITY): Payer: Medicare Other

## 2021-07-09 NOTE — Progress Notes (Signed)
Cardiac Individual Treatment Plan  Patient Details  Name: Joseph Fisher MRN: [id number] Date of Birth: [birth year] Referring Provider:   Flowsheet Row CARDIAC REHAB PHASE II ORIENTATION from 06/21/2021 in [facility]  Referring Provider Dr Rex Kras, DO       Initial Encounter Date:  [place] from 06/21/2021 in [facility]  Date 06/21/21       Visit Diagnosis: 01/30/21 S/P DES LAD  Patient's Home Medications on Admission:  Current Outpatient Medications:    acetaminophen (TYLENOL) 325 MG tablet, Take 650 mg by mouth every 6 (six) hours as needed for moderate pain., Disp: , Rfl:    amLODipine (NORVASC) 5 MG tablet, Take 5 mg by mouth daily., Disp: , Rfl:    clopidogrel (PLAVIX) 75 MG tablet, Take 75 mg by mouth daily., Disp: , Rfl:    gabapentin (NEURONTIN) 300 MG capsule, Take 300 mg by mouth in the morning, at noon, and at bedtime., Disp: , Rfl:    nebivolol (BYSTOLIC) 10 MG tablet, Take 1 tablet (10 mg total) by mouth every evening., Disp: 30 tablet, Rfl: 0   nitroGLYCERIN (NITROSTAT) 0.4 MG SL tablet, PLACE 1 TAB UNDER TONGUE EVERY 5 MINS AS NEEDED FOR CHEST PAIN. GO TO ER IS MORE THAN 2 TABS NEEDED (Patient taking differently: Place 0.4 mg under the tongue every 5 (five) minutes as needed for chest pain.), Disp: 25 tablet, Rfl: 2   omeprazole (PRILOSEC) 40 MG capsule, TAKE 1 CAPSULE BY MOUTH 2 TIMES DAILY BEFORE A MEAL. NEEDS OFFICE VISIT FOR FURTHER REFILLS (Patient taking differently: Take 40 mg by mouth in the morning and at bedtime.), Disp: 180 capsule, Rfl: 0   oxyCODONE-acetaminophen (PERCOCET) 10-325 MG tablet, Take 1 tablet by mouth every 6 (six) hours as needed for pain., Disp: , Rfl: 0   ranolazine (RANEXA) 500 MG 12 hr tablet, TAKE 1 TABLET BY MOUTH TWICE A DAY, Disp: 60 tablet, Rfl: 5   rosuvastatin (CRESTOR) 20 MG tablet, Take 1 tablet (20 mg total) by mouth at  bedtime., Disp: 90 tablet, Rfl: 1   valsartan-hydrochlorothiazide (DIOVAN-HCT) 320-12.5 MG tablet, Take 1 tablet by mouth daily., Disp: , Rfl:   Past Medical History: Past Medical History:  Diagnosis Date   Anemia, unspecified 07/14/2014   Barrett's esophagus    Blood transfusion without reported diagnosis 05/2014   Cervical stenosis of spine    Chronic back pain    Coronary artery disease    Diverticulosis    External hemorrhoids    Gastric ulcer    GERD (gastroesophageal reflux disease)    GI bleed    Hemorrhagic shock (HCC)    Hyperlipidemia    Hypertension    Hypoxemia 06/20/2014   Insomnia    OSA (obstructive sleep apnea) 01/06/2019   Peptic ulcer    Pneumonia    RLS (restless legs syndrome) 06/20/2014   Snoring 06/20/2014   Unspecified deficiency anemia 07/14/2014    Tobacco Use: Social History   Tobacco Use  Smoking Status Never  Smokeless Tobacco Never    Labs: Recent Review Flowsheet Data     Labs for ITP Cardiac and Pulmonary Rehab Latest Ref Rng & Units 08/30/2011 05/28/2014 06/04/2014   TCO2 0 - 100 mmol/L '23 19 22       ' Capillary Blood Glucose: No results found for: GLUCAP   Exercise Target Goals: Exercise Program Goal: Individual exercise prescription set using results from initial 6 min  walk test and THRR while considering  patient's activity barriers and safety.   Exercise Prescription Goal: Initial exercise prescription builds to 30-45 minutes a day of aerobic activity, 2-3 days per week.  Home exercise guidelines will be given to patient during program as part of exercise prescription that the participant will acknowledge.  Activity Barriers & Risk Stratification:  Activity Barriers & Cardiac Risk Stratification - 06/21/21 1556       Activity Barriers & Cardiac Risk Stratification   Activity Barriers Neck/Spine Problems;Back Problems    Cardiac Risk Stratification High   under 5 MET's            6 Minute Walk:  6 Minute Walk      Row Name 06/21/21 1551         6 Minute Walk   Phase Initial     Distance 1910 feet     Walk Time 6 minutes     # of Rest Breaks 0     MPH 3.62     METS 4.5     RPE 9     Perceived Dyspnea  0     VO2 Peak 15.75     Symptoms No     Resting HR 61 bpm     Resting BP 122/74     Resting Oxygen Saturation  97 %     Exercise Oxygen Saturation  during 6 min walk 99 %     Max Ex. HR 96 bpm     Max Ex. BP 160/82     2 Minute Post BP 150/90              Oxygen Initial Assessment:   Oxygen Re-Evaluation:   Oxygen Discharge (Final Oxygen Re-Evaluation):   Initial Exercise Prescription:  Initial Exercise Prescription - 06/21/21 1500       Date of Initial Exercise RX and Referring Provider   Date 06/21/21    Referring Provider Dr Rex Kras, DO    Expected Discharge Date 08/10/21      Treadmill   MPH 2.7    Grade 1    Minutes 15    METs 3.44      NuStep   Level 3    SPM 80    Minutes 15    METs 2.8      Prescription Details   Frequency (times per week) 3    Duration Progress to 30 minutes of continuous aerobic without signs/symptoms of physical distress      Intensity   THRR 40-80% of Max Heartrate 66-133    Ratings of Perceived Exertion 11-13    Perceived Dyspnea 0-4      Progression   Progression Continue progressive overload as per policy without signs/symptoms or physical distress.      Resistance Training   Training Prescription Yes    Weight 4    Reps 10-15             Perform Capillary Blood Glucose checks as needed.  Exercise Prescription Changes:   Exercise Prescription Changes     Row Name 06/25/21 1103 06/27/21 1056           Response to Exercise   Blood Pressure (Admit) 140/70 126/84      Blood Pressure (Exercise) 140/80 142/82      Blood Pressure (Exit) 120/82 104/80      Heart Rate (Admit) 66 bpm 79 bpm      Heart Rate (Exercise) 89 bpm 101 bpm  Heart Rate (Exit) 58 bpm 79 bpm      Rating of Perceived Exertion  (Exercise) 11 8      Symptoms None None      Comments Off to a good start with exercise. --      Duration Continue with 30 min of aerobic exercise without signs/symptoms of physical distress. Continue with 30 min of aerobic exercise without signs/symptoms of physical distress.      Intensity THRR unchanged THRR unchanged             Progression   Progression Continue to progress workloads to maintain intensity without signs/symptoms of physical distress. Continue to progress workloads to maintain intensity without signs/symptoms of physical distress.      Average METs 2.8 2.9             Resistance Training   Training Prescription Yes No      Weight 4 --      Reps 10-15 --      Time 10 Minutes --             Interval Training   Interval Training No No             Treadmill   MPH 2.7 2.7      Grade 1 1      Minutes 15 15      METs 3.44 3.44             NuStep   Level 3 3      SPM 80 85      Minutes 15 15      METs 2.2 2.4               Exercise Comments:   Exercise Comments     Row Name 06/25/21 1158 07/10/21 0721         Exercise Comments Patient tolerated exercise well without symptoms. Patient absent since 06/27/21. Will review METs and goals when patient returns.               Exercise Goals and Review:   Exercise Goals     Row Name 06/21/21 1604             Exercise Goals   Increase Physical Activity Yes       Intervention Provide advice, education, support and counseling about physical activity/exercise needs.;Develop an individualized exercise prescription for aerobic and resistive training based on initial evaluation findings, risk stratification, comorbidities and participant's personal goals.       Expected Outcomes Short Term: Attend rehab on a regular basis to increase amount of physical activity.;Long Term: Add in home exercise to make exercise part of routine and to increase amount of physical activity.;Long Term: Exercising regularly  at least 3-5 days a week.       Increase Strength and Stamina Yes       Intervention Provide advice, education, support and counseling about physical activity/exercise needs.;Develop an individualized exercise prescription for aerobic and resistive training based on initial evaluation findings, risk stratification, comorbidities and participant's personal goals.       Expected Outcomes Short Term: Increase workloads from initial exercise prescription for resistance, speed, and METs.;Short Term: Perform resistance training exercises routinely during rehab and add in resistance training at home;Long Term: Improve cardiorespiratory fitness, muscular endurance and strength as measured by increased METs and functional capacity (6MWT)       Able to understand and use rate of perceived exertion (RPE) scale Yes  Intervention Provide education and explanation on how to use RPE scale       Expected Outcomes Short Term: Able to use RPE daily in rehab to express subjective intensity level;Long Term:  Able to use RPE to guide intensity level when exercising independently       Knowledge and understanding of Target Heart Rate Range (THRR) Yes       Intervention Provide education and explanation of THRR including how the numbers were predicted and where they are located for reference       Expected Outcomes Short Term: Able to state/look up THRR;Long Term: Able to use THRR to govern intensity when exercising independently;Short Term: Able to use daily as guideline for intensity in rehab       Understanding of Exercise Prescription Yes       Intervention Provide education, explanation, and written materials on patient's individual exercise prescription       Expected Outcomes Short Term: Able to explain program exercise prescription;Long Term: Able to explain home exercise prescription to exercise independently                Exercise Goals Re-Evaluation :  Exercise Goals Re-Evaluation     [place] Name  06/25/21 1158             Exercise Goal Re-Evaluation   Exercise Goals Review Increase Physical Activity;Able to understand and use rate of perceived exertion (RPE) scale       Comments Patient able to understand and use RPE scale appropriately.       Expected Outcomes Progress workloads as tolerated to help increase strength and endurance.                Discharge Exercise Prescription (Final Exercise Prescription Changes):  Exercise Prescription Changes - 06/27/21 1056       Response to Exercise   Blood Pressure (Admit) 126/84    Blood Pressure (Exercise) 142/82    Blood Pressure (Exit) 104/80    Heart Rate (Admit) 79 bpm    Heart Rate (Exercise) 101 bpm    Heart Rate (Exit) 79 bpm    Rating of Perceived Exertion (Exercise) 8    Symptoms None    Duration Continue with 30 min of aerobic exercise without signs/symptoms of physical distress.    Intensity THRR unchanged      Progression   Progression Continue to progress workloads to maintain intensity without signs/symptoms of physical distress.    Average METs 2.9      Resistance Training   Training Prescription No      Interval Training   Interval Training No      Treadmill   MPH 2.7    Grade 1    Minutes 15    METs 3.44      NuStep   Level 3    SPM 85    Minutes 15    METs 2.4             Nutrition:  Target Goals: Understanding of nutrition guidelines, daily intake of sodium <1566m, cholesterol <2036m calories 30% from fat and 7% or less from saturated fats, daily to have 5 or more servings of fruits and vegetables.  Biometrics:  Pre Biometrics - 06/21/21 1604       Pre Biometrics   Waist Circumference 47.5 inches    Hip Circumference 44.25 inches    Waist to Hip Ratio 1.07 %    Triceps Skinfold 24 mm    % Body Fat 35.9 %  Grip Strength 36 kg    Flexibility 13.75 in    Single Leg Stand 30 seconds              Nutrition Therapy Plan and Nutrition Goals:   Nutrition  Assessments:  MEDIFICTS Score Key: ?70 Need to make dietary changes  40-70 Heart Healthy Diet ? 40 Therapeutic Level Cholesterol Diet    Picture Your Plate Scores: <50 Unhealthy dietary pattern with much room for improvement. 41-50 Dietary pattern unlikely to meet recommendations for good health and room for improvement. 51-60 More healthful dietary pattern, with some room for improvement.  >60 Healthy dietary pattern, although there may be some specific behaviors that could be improved.    Nutrition Goals Re-Evaluation:   Nutrition Goals Re-Evaluation:   Nutrition Goals Discharge (Final Nutrition Goals Re-Evaluation):   Psychosocial: Target Goals: Acknowledge presence or absence of significant depression and/or stress, maximize coping skills, provide positive support system. Participant is able to verbalize types and ability to use techniques and skills needed for reducing stress and depression.  Initial Review & Psychosocial Screening:  Initial Psych Review & Screening - 06/21/21 1459       Initial Review   Current issues with None Identified      Family Dynamics   Good Support System? Yes   Harden lives with his sister who he has for support     Barriers   Psychosocial barriers to participate in program There are no identifiable barriers or psychosocial needs.      Screening Interventions   Interventions Encouraged to exercise             Quality of Life Scores:  Quality of Life - 06/21/21 1613       Quality of Life   Select Quality of Life      Quality of Life Scores   Health/Function Pre 28.4 %    Socioeconomic Pre 30 %    Psych/Spiritual Pre 30 %    Family Pre 30 %    GLOBAL Pre 29.31 %            Scores of 19 and below usually indicate a poorer quality of life in these areas.  A difference of  2-3 points is a clinically meaningful difference.  A difference of 2-3 points in the total score of the Quality of Life Index has been associated with  significant improvement in overall quality of life, self-image, physical symptoms, and general health in studies assessing change in quality of life.  PHQ-9: Recent Review Flowsheet Data     Depression screen [facility] 2/9 06/21/2021   Decreased Interest 0   Down, Depressed, Hopeless 0   PHQ - 2 Score 0      Interpretation of Total Score  Total Score Depression Severity:  1-4 = Minimal depression, 5-9 = Mild depression, 10-14 = Moderate depression, 15-19 = Moderately severe depression, 20-27 = Severe depression   Psychosocial Evaluation and Intervention:   Psychosocial Re-Evaluation:   Psychosocial Discharge (Final Psychosocial Re-Evaluation):   Vocational Rehabilitation: Provide vocational rehab assistance to qualifying candidates.   Vocational Rehab Evaluation & Intervention:  Vocational Rehab - 06/21/21 1500       Initial Vocational Rehab Evaluation & Intervention   Assessment shows need for Vocational Rehabilitation No   [place] is retired and does not need vocational rehab at this time            Education: Education Goals: Education classes will be provided on a weekly basis, covering required topics.  Participant will state understanding/return demonstration of topics presented.  Learning Barriers/Preferences:  Learning Barriers/Preferences - 06/21/21 1614       Learning Barriers/Preferences   Learning Barriers None    Learning Preferences Audio;Computer/Internet;Pictoral;Verbal Instruction;Video;Written Material             Education Topics: Count Your Pulse:  -Group instruction provided by verbal instruction, demonstration, patient participation and written materials to support subject.  Instructors address importance of being able to find your pulse and how to count your pulse when at home without a heart monitor.  Patients get hands on experience counting their pulse with staff help and individually.   Heart Attack, Angina, and Risk Factor Modification:   -Group instruction provided by verbal instruction, video, and written materials to support subject.  Instructors address signs and symptoms of angina and heart attacks.    Also discuss risk factors for heart disease and how to make changes to improve heart health risk factors.   Functional Fitness:  -Group instruction provided by verbal instruction, demonstration, patient participation, and written materials to support subject.  Instructors address safety measures for doing things around the house.  Discuss how to get up and down off the floor, how to pick things up properly, how to safely get out of a chair without assistance, and balance training.   Meditation and Mindfulness:  -Group instruction provided by verbal instruction, patient participation, and written materials to support subject.  Instructor addresses importance of mindfulness and meditation practice to help reduce stress and improve awareness.  Instructor also leads participants through a meditation exercise.    Stretching for Flexibility and Mobility:  -Group instruction provided by verbal instruction, patient participation, and written materials to support subject.  Instructors lead participants through series of stretches that are designed to increase flexibility thus improving mobility.  These stretches are additional exercise for major muscle groups that are typically performed during regular warm up and cool down.   Hands Only CPR:  -Group verbal, video, and participation provides a basic overview of AHA guidelines for community CPR. Role-play of emergencies allow participants the opportunity to practice calling for help and chest compression technique with discussion of AED use.   Hypertension: -Group verbal and written instruction that provides a basic overview of hypertension including the most recent diagnostic guidelines, risk factor reduction with self-care instructions and medication management.    Nutrition I  class: Heart Healthy Eating:  -Group instruction provided by PowerPoint slides, verbal discussion, and written materials to support subject matter. The instructor gives an explanation and review of the Therapeutic Lifestyle Changes diet recommendations, which includes a discussion on lipid goals, dietary fat, sodium, fiber, plant stanol/sterol esters, sugar, and the components of a well-balanced, healthy diet.   Nutrition II class: Lifestyle Skills:  -Group instruction provided by PowerPoint slides, verbal discussion, and written materials to support subject matter. The instructor gives an explanation and review of label reading, grocery shopping for heart health, heart healthy recipe modifications, and ways to make healthier choices when eating out.   Diabetes Question & Answer:  -Group instruction provided by PowerPoint slides, verbal discussion, and written materials to support subject matter. The instructor gives an explanation and review of diabetes co-morbidities, pre- and post-prandial blood glucose goals, pre-exercise blood glucose goals, signs, symptoms, and treatment of hypoglycemia and hyperglycemia, and foot care basics.   Diabetes Blitz:  -Group instruction provided by PowerPoint slides, verbal discussion, and written materials to support subject matter. The instructor gives an explanation and review of  the physiology behind type 1 and type 2 diabetes, diabetes medications and rational behind using different medications, pre- and post-prandial blood glucose recommendations and Hemoglobin A1c goals, diabetes diet, and exercise including blood glucose guidelines for exercising safely.    Portion Distortion:  -Group instruction provided by PowerPoint slides, verbal discussion, written materials, and food models to support subject matter. The instructor gives an explanation of serving size versus portion size, changes in portions sizes over the last 20 years, and what consists of a serving  from each food group.   Stress Management:  -Group instruction provided by verbal instruction, video, and written materials to support subject matter.  Instructors review role of stress in heart disease and how to cope with stress positively.     Exercising on Your Own:  -Group instruction provided by verbal instruction, power point, and written materials to support subject.  Instructors discuss benefits of exercise, components of exercise, frequency and intensity of exercise, and end points for exercise.  Also discuss use of nitroglycerin and activating EMS.  Review options of places to exercise outside of rehab.  Review guidelines for sex with heart disease.   Cardiac Drugs I:  -Group instruction provided by verbal instruction and written materials to support subject.  Instructor reviews cardiac drug classes: antiplatelets, anticoagulants, beta blockers, and statins.  Instructor discusses reasons, side effects, and lifestyle considerations for each drug class.   Cardiac Drugs II:  -Group instruction provided by verbal instruction and written materials to support subject.  Instructor reviews cardiac drug classes: angiotensin converting enzyme inhibitors (ACE-I), angiotensin II receptor blockers (ARBs), nitrates, and calcium channel blockers.  Instructor discusses reasons, side effects, and lifestyle considerations for each drug class.   Anatomy and Physiology of the Circulatory System:  Group verbal and written instruction and models provide basic cardiac anatomy and physiology, with the coronary electrical and arterial systems. Review of: AMI, Angina, Valve disease, Heart Failure, Peripheral Artery Disease, Cardiac Arrhythmia, Pacemakers, and the ICD.   Other Education:  -Group or individual verbal, written, or video instructions that support the educational goals of the cardiac rehab program.   Holiday Eating Survival Tips:  -Group instruction provided by PowerPoint slides, verbal  discussion, and written materials to support subject matter. The instructor gives patients tips, tricks, and techniques to help them not only survive but enjoy the holidays despite the onslaught of food that accompanies the holidays.   Knowledge Questionnaire Score:  Knowledge Questionnaire Score - 06/21/21 1614       Knowledge Questionnaire Score   Pre Score 15/24             Core Components/Risk Factors/Patient Goals at Admission:  Personal Goals and Risk Factors at Admission - 06/21/21 1615       Core Components/Risk Factors/Patient Goals on Admission    Weight Management Yes;Obesity;Weight Loss    Intervention Weight Management: Develop a combined nutrition and exercise program designed to reach desired caloric intake, while maintaining appropriate intake of nutrient and fiber, sodium and fats, and appropriate energy expenditure required for the weight goal.;Weight Management: Provide education and appropriate resources to help participant work on and attain dietary goals.;Weight Management/Obesity: Establish reasonable short term and long term weight goals.;Obesity: Provide education and appropriate resources to help participant work on and attain dietary goals.    Admit Weight 232 lb 12.9 oz (105.6 kg)    Expected Outcomes Short Term: Continue to assess and modify interventions until short term weight is achieved;Long Term: Adherence to nutrition and physical activity/exercise  program aimed toward attainment of established weight goal;Weight Loss: Understanding of general recommendations for a balanced deficit meal plan, which promotes 1-2 lb weight loss per week and includes a negative energy balance of [phone] kcal/d;Understanding recommendations for meals to include 15-35% energy as protein, 25-35% energy from fat, 35-60% energy from carbohydrates, less than 241m of dietary cholesterol, 20-35 gm of total fiber daily;Understanding of distribution of calorie intake throughout the day  with the consumption of 4-5 meals/snacks    Hypertension Yes    Intervention Provide education on lifestyle modifcations including regular physical activity/exercise, weight management, moderate sodium restriction and increased consumption of fresh fruit, vegetables, and low fat dairy, alcohol moderation, and smoking cessation.;Monitor prescription use compliance.    Expected Outcomes Short Term: Continued assessment and intervention until BP is < 140/949mHG in hypertensive participants. < 130/8053mG in hypertensive participants with diabetes, heart failure or chronic kidney disease.;Long Term: Maintenance of blood pressure at goal levels.    Lipids Yes    Intervention Provide education and support for participant on nutrition & aerobic/resistive exercise along with prescribed medications to achieve LDL <93m62mDL >40mg12m Expected Outcomes Short Term: Participant states understanding of desired cholesterol values and is compliant with medications prescribed. Participant is following exercise prescription and nutrition guidelines.;Long Term: Cholesterol controlled with medications as prescribed, with individualized exercise RX and with personalized nutrition plan. Value goals: LDL < 93mg,23m > 40 mg.    Stress Yes    Intervention Offer individual and/or small group education and counseling on adjustment to heart disease, stress management and health-related lifestyle change. Teach and support self-help strategies.;Refer participants experiencing significant psychosocial distress to appropriate mental health specialists for further evaluation and treatment. When possible, include family members and significant others in education/counseling sessions.    Expected Outcomes Short Term: Participant demonstrates changes in health-related behavior, relaxation and other stress management skills, ability to obtain effective social support, and compliance with psychotropic medications if prescribed.;Long Term:  Emotional wellbeing is indicated by absence of clinically significant psychosocial distress or social isolation.    Personal Goal Other Yes    Personal Goal Short and long the same: gain strength, endurance, lose wt, get into shape. Get into regular ex routine    Intervention Will continue to monitor pt and progress workloads as tolerated without sign or symptom    Expected Outcomes Pt will achieve his goals             Core Components/Risk Factors/Patient Goals Review:    Core Components/Risk Factors/Patient Goals at Discharge (Final Review):    ITP Comments:  ITP Comments     Row Name 06/21/21 1453 07/10/21 1210         ITP Comments Dr Traci Fransico Himedical Director 30 Day ITP Review. Milik Juanitottended two excercise sessions but has not returned to exercise since 06/27/21.               Comments: See ITP Comments

## 2021-07-10 ENCOUNTER — Telehealth (HOSPITAL_COMMUNITY): Payer: Self-pay | Admitting: *Deleted

## 2021-07-10 NOTE — Telephone Encounter (Signed)
Patient called out on 06/29/21, has been absent from cardiac rehab since 06/27/21. Called to check in with patient. No answer, message left on voicemail.

## 2021-07-11 ENCOUNTER — Encounter (HOSPITAL_COMMUNITY): Payer: Medicare Other

## 2021-07-11 ENCOUNTER — Telehealth (HOSPITAL_COMMUNITY): Payer: Self-pay | Admitting: Physician Assistant

## 2021-07-13 ENCOUNTER — Encounter (HOSPITAL_COMMUNITY): Payer: Medicare Other

## 2021-07-16 ENCOUNTER — Encounter (HOSPITAL_COMMUNITY): Payer: Medicare Other

## 2021-07-18 ENCOUNTER — Encounter (HOSPITAL_COMMUNITY): Payer: Medicare Other

## 2021-07-20 ENCOUNTER — Encounter (HOSPITAL_COMMUNITY): Payer: Medicare Other

## 2021-07-23 ENCOUNTER — Encounter (HOSPITAL_COMMUNITY): Payer: Medicare Other

## 2021-07-25 ENCOUNTER — Encounter (HOSPITAL_COMMUNITY): Payer: Medicare Other

## 2021-07-27 ENCOUNTER — Encounter (HOSPITAL_COMMUNITY): Payer: Self-pay | Admitting: *Deleted

## 2021-07-27 ENCOUNTER — Encounter (HOSPITAL_COMMUNITY): Payer: Medicare Other

## 2021-07-27 DIAGNOSIS — Z955 Presence of coronary angioplasty implant and graft: Secondary | ICD-10-CM

## 2021-07-27 NOTE — Progress Notes (Signed)
Burney attended 2 exercise sessions on 06/25/21 and 06/27/21. Deion did not return and has been discharged from the [place], RN,BSN 07/27/2021 12:10 PM

## 2021-07-30 ENCOUNTER — Encounter (HOSPITAL_COMMUNITY): Payer: Medicare Other

## 2021-08-01 ENCOUNTER — Encounter (HOSPITAL_COMMUNITY): Payer: Medicare Other

## 2021-08-03 ENCOUNTER — Encounter (HOSPITAL_COMMUNITY): Payer: Medicare Other

## 2021-08-06 ENCOUNTER — Encounter (HOSPITAL_COMMUNITY): Payer: Medicare Other

## 2021-08-08 ENCOUNTER — Encounter (HOSPITAL_COMMUNITY): Payer: Medicare Other

## 2021-08-10 ENCOUNTER — Encounter (HOSPITAL_COMMUNITY): Payer: Medicare Other

## 2021-08-13 ENCOUNTER — Encounter (HOSPITAL_COMMUNITY): Payer: Medicare Other

## 2021-08-14 NOTE — Progress Notes (Signed)
External Labs: Collected: 07/12/2021 provided by [facility]. Hemoglobin A1c 5.9 Hemoglobin 14.5 g/dL, hematocrit 42.8%

## 2021-08-15 ENCOUNTER — Encounter (HOSPITAL_COMMUNITY): Payer: Medicare Other

## 2021-08-17 ENCOUNTER — Encounter (HOSPITAL_COMMUNITY): Payer: Medicare Other

## 2021-10-03 ENCOUNTER — Other Ambulatory Visit: Payer: Self-pay | Admitting: Cardiology

## 2021-10-04 ENCOUNTER — Ambulatory Visit: Payer: Medicare Other | Admitting: Cardiology

## 2021-10-04 ENCOUNTER — Other Ambulatory Visit: Payer: Self-pay | Admitting: Cardiology

## 2021-10-04 ENCOUNTER — Encounter: Payer: Self-pay | Admitting: Cardiology

## 2021-10-04 ENCOUNTER — Other Ambulatory Visit: Payer: Self-pay

## 2021-10-04 VITALS — BP 139/84 | HR 64 | Resp 16 | Ht 67.0 in | Wt 234.3 lb

## 2021-10-04 DIAGNOSIS — Z6837 Body mass index (BMI) 37.0-37.9, adult: Secondary | ICD-10-CM

## 2021-10-04 DIAGNOSIS — G4733 Obstructive sleep apnea (adult) (pediatric): Secondary | ICD-10-CM

## 2021-10-04 DIAGNOSIS — E782 Mixed hyperlipidemia: Secondary | ICD-10-CM

## 2021-10-04 DIAGNOSIS — I10 Essential (primary) hypertension: Secondary | ICD-10-CM

## 2021-10-04 DIAGNOSIS — Z955 Presence of coronary angioplasty implant and graft: Secondary | ICD-10-CM

## 2021-10-04 DIAGNOSIS — R0609 Other forms of dyspnea: Secondary | ICD-10-CM

## 2021-10-04 DIAGNOSIS — I2510 Atherosclerotic heart disease of native coronary artery without angina pectoris: Secondary | ICD-10-CM

## 2021-10-04 MED ORDER — VASCEPA 1 G PO CAPS: 2.0000 g | ORAL_CAPSULE | Freq: Two times a day (BID) | ORAL | 2 refills | Status: DC

## 2021-10-04 MED ORDER — ROSUVASTATIN CALCIUM 40 MG PO TABS: 40.0000 mg | ORAL_TABLET | Freq: Every day | ORAL | 0 refills | Status: DC

## 2021-10-04 NOTE — Progress Notes (Signed)
Date:  10/04/2021   ID:  [name], DOB 1966-06-18, MRN [id number]  PCP:  Shanon Rosser, PA-C  Cardiologist:  Rex Kras, DO, [place] (established care 01/26/2021)  Date: 10/04/21 Last Office Visit: 03/30/2021  Chief Complaint  Patient presents with   Coronary Artery Disease   Follow-up    6 months   HPI  Joseph Fisher is a 55 y.o. male who presents to the office with a chief complaint of "32-monthfollow-up for CAD management" Patient's past medical history and cardiovascular risk factors include: CAD s/p PCI,  Hypertension, OSA not on CPAP, hyperlipidemia, obesity due to excess calories.  He is referred to the office at the request of [place], SNicki Reaper PA-C for evaluation of chest pain.  Patient establish care in April 2022 and during initial consultation patient's symptoms of chest pain are very suggestive of unstable angina.  He refused elective admission and subsequently underwent left heart catheterization electively and was noted to have obstructive CAD in the LAD distribution and underwent angioplasty and stenting.  He presents for 637-monthollow-up visit.  He is tolerating dual antiplatelet therapy well without any evidence of bleeding.  Patient's weight remains relatively stable.  Patient states that his hemoglobin A1c has improved.  He continues to ambulate about 2 miles 3 times a week unable to uptitrate his physical activity due to neuropathy.  No hospitalizations or urgent care visits for cardiovascular symptoms since last office encounter.  Patient denies any active chest pain or shortness of breath at rest or with effort related activities.  No use of sublingual nitroglycerin tablets in the recent past.  FUNCTIONAL STATUS: Walks at least 2 miles per day and also some resistance training at the gym.  ALLERGIES: No Known Allergies  MEDICATION LIST PRIOR TO VISIT: Current Meds  Medication Sig   acetaminophen (TYLENOL) 325 MG tablet Take 650 mg by mouth every 6 (six)  hours as needed for moderate pain.   amLODipine (NORVASC) 5 MG tablet Take 5 mg by mouth daily.   clopidogrel (PLAVIX) 75 MG tablet Take 75 mg by mouth daily.   gabapentin (NEURONTIN) 300 MG capsule Take 300 mg by mouth in the morning, at noon, and at bedtime.   nebivolol (BYSTOLIC) 10 MG tablet Take 1 tablet (10 mg total) by mouth every evening.   nitroGLYCERIN (NITROSTAT) 0.4 MG SL tablet PLACE 1 TAB UNDER TONGUE EVERY 5 MINS AS NEEDED FOR CHEST PAIN. GO TO ER IS MORE THAN 2 TABS NEEDED (Patient taking differently: Place 0.4 mg under the tongue every 5 (five) minutes as needed for chest pain.)   omeprazole (PRILOSEC) 40 MG capsule TAKE 1 CAPSULE BY MOUTH 2 TIMES DAILY BEFORE A MEAL. NEEDS OFFICE VISIT FOR FURTHER REFILLS (Patient taking differently: Take 40 mg by mouth in the morning and at bedtime.)   oxyCODONE-acetaminophen (PERCOCET) 10-325 MG tablet Take 1 tablet by mouth every 6 (six) hours as needed for pain.   ranolazine (RANEXA) 500 MG 12 hr tablet TAKE 1 TABLET BY MOUTH TWICE A DAY   valsartan-hydrochlorothiazide (DIOVAN-HCT) 320-12.5 MG tablet Take 1 tablet by mouth daily.   VASCEPA 1 g capsule Take 2 capsules (2 g total) by mouth 2 (two) times daily.   [DISCONTINUED] rosuvastatin (CRESTOR) 20 MG tablet TAKE 1 TABLET BY MOUTH EVERYDAY AT BEDTIME     PAST MEDICAL HISTORY: Past Medical History:  Diagnosis Date   Anemia, unspecified 07/14/2014   Barrett's esophagus    Blood transfusion without reported diagnosis 05/2014   Cervical  stenosis of spine    Chronic back pain    Coronary artery disease    Diverticulosis    External hemorrhoids    Gastric ulcer    GERD (gastroesophageal reflux disease)    GI bleed    Hemorrhagic shock (HCC)    Hyperlipidemia    Hypertension    Hypoxemia 06/20/2014   Insomnia    OSA (obstructive sleep apnea) 01/06/2019   Peptic ulcer    Pneumonia    RLS (restless legs syndrome) 06/20/2014   Snoring 06/20/2014   Unspecified deficiency anemia  07/14/2014    PAST SURGICAL HISTORY: Past Surgical History:  Procedure Laterality Date   ANTERIOR CERVICAL DECOMP/DISCECTOMY FUSION N/A 10/03/2017   Procedure: ACDF - C4-C5 - C5-C6 - C6-C7;  Surgeon: Earnie Larsson, MD;  Location: [place];  Service: Neurosurgery;  Laterality: N/A;   BACK SURGERY  10/29/2011   CARDIAC CATHETERIZATION     CORONARY STENT INTERVENTION N/A 01/30/2021   Procedure: CORONARY STENT INTERVENTION;  Surgeon: Nigel Mormon, MD;  Location: [facility];  Service: Cardiovascular;  Laterality: N/A;   ESOPHAGOGASTRODUODENOSCOPY N/A 06/04/2014   Procedure: ESOPHAGOGASTRODUODENOSCOPY (EGD);  Surgeon: Jerene Bears, MD;  Location: [facility] END[facility]COPY;  Service: Endoscopy;  Laterality: N/A;   INTRAVASCULAR IMAGING/OCT N/A 01/30/2021   Procedure: INTRAVASCULAR IMAGING/OCT;  Surgeon: Nigel Mormon, MD;  Location: [facility];  Service: Cardiovascular;  Laterality: N/A;   LEFT HEART CATH AND CORONARY ANGIOGRAPHY N/A 01/30/2021   Procedure: LEFT HEART CATH AND CORONARY ANGIOGRAPHY;  Surgeon: Nigel Mormon, MD;  Location: [place];  Service: Cardiovascular;  Laterality: N/A;    FAMILY HISTORY: The patient family history includes Hypertension in his father and mother.  SOCIAL HISTORY:  The patient  reports that he has never smoked. He has never used smokeless tobacco. He reports that he does not currently use alcohol. He reports that he does not use drugs.  REVIEW OF SYSTEMS: Review of Systems  Constitutional: Negative for chills, fever and malaise/fatigue.  HENT:  Negative for hoarse voice and nosebleeds.   Eyes:  Negative for discharge, double vision and pain.  Cardiovascular:  Negative for chest pain, claudication, dyspnea on exertion, leg swelling, near-syncope, orthopnea, palpitations, paroxysmal nocturnal dyspnea and syncope.  Respiratory:  Negative for hemoptysis and shortness of breath.   Musculoskeletal:  Negative for muscle cramps and  myalgias.  Gastrointestinal:  Negative for abdominal pain, constipation, diarrhea, hematemesis, hematochezia, melena, nausea and vomiting.  Neurological:  Negative for dizziness and light-headedness.   PHYSICAL EXAM: Vitals with BMI 10/04/2021 06/21/2021 03/30/2021  Height '5\' 7"'  5' 7.25" '5\' 6"'   Weight 234 lbs 5 oz 232 lbs 13 oz 234 lbs 6 oz  BMI 36.69 33.2 95.18  Systolic 841 660 630  Diastolic 84 74 77  Pulse 64 61 61   CONSTITUTIONAL: Well-developed and well-nourished. No acute distress.  SKIN: Skin is warm and dry. No rash noted. No cyanosis. No pallor. No jaundice HEAD: Normocephalic and atraumatic.  EYES: No scleral icterus MOUTH/THROAT: Moist oral membranes.  NECK: No JVD present. No thyromegaly noted. No carotid bruits  LYMPHATIC: No visible cervical adenopathy.  CHEST Normal respiratory effort. No intercostal retractions  LUNGS: Clear to auscultation bilaterally.  No stridor. No wheezes. No rales.  CARDIOVASCULAR: Regular rate and rhythm, positive S1-S2, no murmurs rubs or gallops appreciated ABDOMINAL: Obese, soft, nontender, nondistended, positive bowel sounds in all 4 quadrants no apparent ascites.  EXTREMITIES: No peripheral edema  HEMATOLOGIC: No significant bruising NEUROLOGIC: Oriented  to person, place, and time. Nonfocal. Normal muscle tone.  PSYCHIATRIC: Normal mood and affect. Normal behavior. Cooperative  CARDIAC DATABASE: EKG: 10/04/2021: Sinus bradycardia, 59 bpm, nonspecific T wave abnormality, without underlying injury pattern.   Echocardiogram: 03/06/2021:  Left ventricle cavity is normal in size and wall thickness. Normal global wall motion. Normal LV systolic function with EF 67%. Normal diastolic filling pattern.  No significant valvular abnormality.  Normal right atrial pressure.    Stress Testing: No results found for this or any previous visit from the past 1095 days.   Heart Catheterization: 01/30/2021: LM: Normal LAD: Proximal 90% stenosis          Diagonal 1 proximal 60% stenosis         Distal apical 90% stenosis in a small caliber vessel LCx: Normal RCA: Distal 20%, RPDA 20% stenosis  LABORATORY DATA: CBC Latest Ref Rng & Units 01/30/2021 09/29/2017 10/31/2014  WBC 4.0 - 10.5 K/uL 4.7 3.9(L) 3.4(L)  Hemoglobin 13.0 - 17.0 g/dL 15.2 15.0 14.4  Hematocrit 39.0 - 52.0 % 44.5 44.0 42.5  Platelets 150 - 400 K/uL 189 152 128(L)    CMP Latest Ref Rng & Units 01/26/2021 09/29/2017 10/09/2014  Glucose 65 - 99 mg/dL 125(H) 123(H) 108(H)  BUN 6 - 24 mg/dL '14 12 11  ' Creatinine 0.76 - 1.27 mg/dL 0.93 0.91 0.91  Sodium 134 - 144 mmol/L 142 138 137  Potassium 3.5 - 5.2 mmol/L 4.4 3.8 4.3  Chloride 96 - 106 mmol/L 103 103 96  CO2 20 - 29 mmol/L '24 27 23  ' Calcium 8.7 - 10.2 mg/dL 9.4 9.7 10.2  Total Protein 6.0 - 8.3 g/dL - - 8.4(H)  Total Bilirubin 0.3 - 1.2 mg/dL - - 0.8  Alkaline Phos 39 - 117 U/L - - 47  AST 0 - 37 U/L - - 18  ALT 0 - 53 U/L - - 19    Lipid Panel  No results found for: CHOL, TRIG, HDL, CHOLHDL, VLDL, LDLCALC, LDLDIRECT, LABVLDL  No components found for: NTPROBNP  Recent Labs    01/26/21 1422  PROBNP 95   No results for input(s): TSH in the last 8760 hours.  BMP Recent Labs    01/26/21 1422  NA 142  K 4.4  CL 103  CO2 24  GLUCOSE 125*  BUN 14  CREATININE 0.93  CALCIUM 9.4    HEMOGLOBIN A1C No results found for: HGBA1C, MPG  External Labs: Collected: 01/13/2021, provided by PCP Hemoglobin 14.1 g/dL, hematocrit 41.1% Platelets 147 Creatinine 0.84 mg/dL. eGFR: 99 mL/min per 1.73 m Potassium 3.8 AST 21, ALT 27, alkaline phosphatase 53 Hemoglobin A1c: 6.1  External Labs: Collected: 07/09/2021 provided by the patient. Hemoglobin A1c 5.9. Hemoglobin 14.5 g/dL, hematocrit 42.8%. Platelets 150.  07/05/2021 total cholesterol 137, HDL 41, triglycerides 312, LDL 63, non-HDL 96   IMPRESSION:    ICD-10-CM   1. Atherosclerosis of native coronary artery of native heart without angina pectoris  I25.10  EKG 12-Lead    CMP14+EGFR    Lipid Panel With LDL/HDL Ratio    LDL cholesterol, direct    2. History of coronary angioplasty with insertion of stent  Z95.5     3. Dyspnea on exertion  R06.09     4. Benign essential hypertension  I10     5. Mixed hyperlipidemia  E78.2 CMP14+EGFR    Lipid Panel With LDL/HDL Ratio    LDL cholesterol, direct    6. Class 2 severe obesity due to excess calories with serious comorbidity  and body mass index (BMI) of 37.0 to 37.9 in adult (HCC)  E66.01    Z68.37     7. OSA (obstructive sleep apnea)  G47.33        RECOMMENDATIONS: Joseph Fisher is a 55 y.o. male whose past medical history and cardiac risk factors include:  CAD s/p PCI,  Hypertension, OSA not on CPAP, hyperlipidemia, obesity due to excess calories.  Atherosclerosis of the native coronary arteries without angina pectoris status post angioplasty/PCI: Chest pain-free. Tolerating dual antiplatelet therapy without any side effects. Left heart catheterization April 2022: PCI to the LAD EKG: Sinus bradycardia without underlying injury pattern. Outside labs independently reviewed dated 06/2021.  Triglycerides are not well controlled and LDL levels could be better controlled. Increase Crestor to 40 mg p.o. nightly. Start Vascepa Check fasting lipid profile, direct LDL, and CMP in 6 weeks after starting Vascepa and uptitrating Crestor Educated on the importance of secondary prevention. -Educated on the importance of blood pressure management with a goal systolic blood pressure of 130 mmHg on average. -Check fasting lipid profile to reevaluate statin therapy. -Educated importance of glycemic control -Educated on importance of weight loss, dietary restrictions, and a low-salt diet -Encouraged to follow-up with his sleep medicine provider  Benign essential hypertension: Office blood pressures within acceptable range. Medications reconciled. Low-salt diet recommended.  Mixed  hyperlipidemia: Recent labs from 06/2021 provided by PCP reviewed.  LDL currently not at goal given his CAD. Currently on Crestor 20 mg p.o. nightly will uptitrated to 40 mg p.o. nightly.  FINAL MEDICATION LIST END OF ENCOUNTER: Meds ordered this encounter  Medications   VASCEPA 1 g capsule    Sig: Take 2 capsules (2 g total) by mouth 2 (two) times daily.    Dispense:  120 capsule    Refill:  2   rosuvastatin (CRESTOR) 40 MG tablet    Sig: Take 1 tablet (40 mg total) by mouth at bedtime.    Dispense:  90 tablet    Refill:  0    Medications Discontinued During This Encounter  Medication Reason   rosuvastatin (CRESTOR) 20 MG tablet Reorder     Current Outpatient Medications:    acetaminophen (TYLENOL) 325 MG tablet, Take 650 mg by mouth every 6 (six) hours as needed for moderate pain., Disp: , Rfl:    amLODipine (NORVASC) 5 MG tablet, Take 5 mg by mouth daily., Disp: , Rfl:    clopidogrel (PLAVIX) 75 MG tablet, Take 75 mg by mouth daily., Disp: , Rfl:    gabapentin (NEURONTIN) 300 MG capsule, Take 300 mg by mouth in the morning, at noon, and at bedtime., Disp: , Rfl:    nebivolol (BYSTOLIC) 10 MG tablet, Take 1 tablet (10 mg total) by mouth every evening., Disp: 30 tablet, Rfl: 0   nitroGLYCERIN (NITROSTAT) 0.4 MG SL tablet, PLACE 1 TAB UNDER TONGUE EVERY 5 MINS AS NEEDED FOR CHEST PAIN. GO TO ER IS MORE THAN 2 TABS NEEDED (Patient taking differently: Place 0.4 mg under the tongue every 5 (five) minutes as needed for chest pain.), Disp: 25 tablet, Rfl: 2   omeprazole (PRILOSEC) 40 MG capsule, TAKE 1 CAPSULE BY MOUTH 2 TIMES DAILY BEFORE A MEAL. NEEDS OFFICE VISIT FOR FURTHER REFILLS (Patient taking differently: Take 40 mg by mouth in the morning and at bedtime.), Disp: 180 capsule, Rfl: 0   oxyCODONE-acetaminophen (PERCOCET) 10-325 MG tablet, Take 1 tablet by mouth every 6 (six) hours as needed for pain., Disp: , Rfl: 0   ranolazine (  RANEXA) 500 MG 12 hr tablet, TAKE 1 TABLET BY MOUTH  TWICE A DAY, Disp: 60 tablet, Rfl: 5   valsartan-hydrochlorothiazide (DIOVAN-HCT) 320-12.5 MG tablet, Take 1 tablet by mouth daily., Disp: , Rfl:    VASCEPA 1 g capsule, Take 2 capsules (2 g total) by mouth 2 (two) times daily., Disp: 120 capsule, Rfl: 2   rosuvastatin (CRESTOR) 40 MG tablet, Take 1 tablet (40 mg total) by mouth at bedtime., Disp: 90 tablet, Rfl: 0  Orders Placed This Encounter  Procedures   CMP14+EGFR   Lipid Panel With LDL/HDL Ratio   LDL cholesterol, direct   EKG 12-Lead    There are no Patient Instructions on file for this visit.   --Continue cardiac medications as reconciled in final medication list. --Return in about 7 weeks (around 11/22/2021) for Follow up, CAD,HLD, TAG, s/p lipids . Or sooner if needed. --Continue follow-up with your primary care physician regarding the management of your other chronic comorbid conditions.  Patient's questions and concerns were addressed to his satisfaction. He voices understanding of the instructions provided during this encounter.   This note was created using a voice recognition software as a result there may be grammatical errors inadvertently enclosed that do not reflect the nature of this encounter. Every attempt is made to correct such errors.  Rex Kras, [place], [facility]  Pager: [phone] Office: [phone]

## 2021-10-05 LAB — CMP14+EGFR
ALT: 20 IU/L (ref 0–44)
AST: 14 IU/L (ref 0–40)
Albumin/Globulin Ratio: 2.6 — ABNORMAL HIGH (ref 1.2–2.2)
Albumin: 4.9 g/dL (ref 3.8–4.9)
Alkaline Phosphatase: 50 IU/L (ref 44–121)
BUN/Creatinine Ratio: 13 (ref 9–20)
BUN: 12 mg/dL (ref 6–24)
Bilirubin Total: 0.4 mg/dL (ref 0.0–1.2)
CO2: 24 mmol/L (ref 20–29)
Calcium: 9.2 mg/dL (ref 8.7–10.2)
Chloride: 103 mmol/L (ref 96–106)
Creatinine, Ser: 0.93 mg/dL (ref 0.76–1.27)
Globulin, Total: 1.9 g/dL (ref 1.5–4.5)
Glucose: 148 mg/dL — ABNORMAL HIGH (ref 70–99)
Potassium: 4.3 mmol/L (ref 3.5–5.2)
Sodium: 143 mmol/L (ref 134–144)
Total Protein: 6.8 g/dL (ref 6.0–8.5)
eGFR: 97 mL/min/{1.73_m2} (ref >59)

## 2021-10-05 LAB — LIPID PANEL WITH LDL/HDL RATIO
Cholesterol, Total: 140 mg/dL (ref 100–199)
HDL: 44 mg/dL (ref >39)
LDL Chol Calc (NIH): 56 mg/dL (ref 0–99)
LDL/HDL Ratio: 1.3 ratio (ref 0.0–3.6)
Triglycerides: 255 mg/dL — ABNORMAL HIGH (ref 0–149)
VLDL Cholesterol Cal: 40 mg/dL (ref 5–40)

## 2021-10-05 LAB — LDL CHOLESTEROL, DIRECT: LDL Direct: 55 mg/dL (ref 0–99)

## 2021-10-08 ENCOUNTER — Encounter: Payer: Self-pay | Admitting: Neurology

## 2021-10-09 ENCOUNTER — Encounter: Payer: Self-pay | Admitting: Neurology

## 2021-10-09 ENCOUNTER — Ambulatory Visit (INDEPENDENT_AMBULATORY_CARE_PROVIDER_SITE_OTHER): Payer: Medicare Other | Admitting: Neurology

## 2021-10-09 VITALS — BP 134/80 | HR 63 | Ht 66.0 in | Wt 231.5 lb

## 2021-10-09 DIAGNOSIS — M5441 Lumbago with sciatica, right side: Secondary | ICD-10-CM

## 2021-10-09 DIAGNOSIS — R519 Headache, unspecified: Secondary | ICD-10-CM

## 2021-10-09 DIAGNOSIS — Z8739 Personal history of other diseases of the musculoskeletal system and connective tissue: Secondary | ICD-10-CM

## 2021-10-09 DIAGNOSIS — G2581 Restless legs syndrome: Secondary | ICD-10-CM | POA: Diagnosis not present

## 2021-10-09 DIAGNOSIS — M5442 Lumbago with sciatica, left side: Secondary | ICD-10-CM

## 2021-10-09 DIAGNOSIS — G8929 Other chronic pain: Secondary | ICD-10-CM

## 2021-10-09 DIAGNOSIS — M792 Neuralgia and neuritis, unspecified: Secondary | ICD-10-CM | POA: Diagnosis not present

## 2021-10-09 DIAGNOSIS — E7841 Elevated Lipoprotein(a): Secondary | ICD-10-CM

## 2021-10-09 NOTE — Progress Notes (Signed)
Called pt because he checked out and did not complete blood work that Dr. Brett Fairy ordered. Pt will turn around and come back to office to complete today. Made [facility] aware

## 2021-10-09 NOTE — Progress Notes (Addendum)
Provider:  [name], M D  Referring Provider: Shanon Rosser, PA-C Primary Care Physician:  Shanon Rosser, PA-C  Chief Complaint  Patient presents with   New Patient (Initial Visit)    Room 10. Pt is alone. Pt having problems with numbness in feet.     HPI:  Joseph Fisher is a 55 y.o. male  Is seen here as a referral/ revisit  from [facility] for Neuropathy. Due to the long interval between his last visit and now he is considered a new patient and we indeed are asked to see him for a new problem which is foot pain the feeling of his feet being strangled and advice, sometimes the back of the foot is very pansensitive, the feet feel cold but when touched are actually not truly cold.  He describes the dysesthesia or pain sensation as burning, he does not describe this as pins and needle dysesthesia.  He used to try numbness but he truly has pain and discomfort.  It bothers him the most when he is at rest at night and sometimes keeps him from falling asleep what he describes of restless legs the irresistible urge to have to move in order to make the feeling stop.  The patient is not diabetic.  He does not have a history of thyroid disease or any other endocrine disorders. He reports some neck tension, trouble to go to go to sleep, not trouble to stay asleep aside form frequent Nocturia and severe, untreated  sleep apnea. CPAP intolerant.  Has looked into dental devices but he is already retired and there is lack of financial support for a dental device.    I learned at the end of our visit today that Joseph Fisher already had a nerve conduction and EMG study with Dr. Dossie Der and I do not find it on his records.  So I will also currently for the patient to sign out to Dr. Doy Mince Ramo's is results and if the see that this study would be similar to the one I had ordered I would definitely consult I will order here to repeat that.  He will have a home sleep test and I given the extensive lab work he  had in March and some additional in September I will also consult at today's laboratory testing.  I had seen this patient back in 2015 - here is his last presentation ;Reported at that time he was told that he is a loud snorer and that he snores louder when on his back. He has a history of hypertension and he has gained a significant amount of weight over the last 5 years the at about 20 pounds over the time.  He also has had trouble to control his blood pressure is currently taking lisinopril and beta blockers, that was at a time when he felt extremely tired, and now feels better on medications.   During the visit with his primary care provider his blood pressure in the right arm was 540G systolic and in the left 867 mmHg. He had a bleeding GI ulcer on 06-06-14 , and was hospitalized at [facility] , when the nurses noted his oxygen levels to drop significantly while sleeping. He vomited blood and was transfused. This ulcer was attributed to Goodie powders , he takes for morning headaches He reported daytime fatigue and sleepiness.  .    The patient retired in 2018 , based on a medical condition, due to back injuries, chronic back pain.  Not a smoker, not drinking  ETOH regular, none.  2 cups of coffee in AM.  Lives with his sister, not married, no children.  No longer anemic since he stopped Goodie powders.       Review of Systems: Out of a complete 14 system review, the patient complains of only the following symptoms, and all other reviewed systems are negative. RLS , burning feet, can't tolerate standing for more than one hour, pain will exacerbate.  feet in a vice- sleep onset difficulties.    Neck tension pain.   How likely are you to doze in the following situations: 0 = not likely, 1 = slight chance, 2 = moderate chance, 3 = high chance  Sitting and Reading? Watching Television? Sitting inactive in a public place (theater or meeting)? Lying down in the afternoon when circumstances  permit? Sitting and talking to someone? Sitting quietly after lunch without alcohol? In a car, while stopped for a few minutes in traffic? As a passenger in a car for an hour without a break?  Total = 12   Cannot tolerate a blanket on his feet, even socks.   Social History   Socioeconomic History   Marital status: Divorced    Spouse name: Not on file   Number of children: 0   Years of education: 12   Highest education level: Not on file  Occupational History   Occupation: disabled  Tobacco Use   Smoking status: Never   Smokeless tobacco: Never  Vaping Use   Vaping Use: Never used  Substance and Sexual Activity   Alcohol use: Not Currently    Alcohol/week: 0.0 standard drinks   Drug use: No   Sexual activity: Yes    Birth control/protection: None  Other Topics Concern   Not on file  Social History Narrative   Not on file   Social Determinants of Health   Financial Resource Strain: Not on file  Food Insecurity: Not on file  Transportation Needs: Not on file  Physical Activity: Not on file  Stress: Not on file  Social Connections: Not on file  Intimate Partner Violence: Not on file    Family History  Problem Relation Age of Onset   Hypertension Mother    Hypertension Father     Past Medical History:  Diagnosis Date   Anemia, unspecified 07/14/2014   Barrett's esophagus    Blood transfusion without reported diagnosis 05/2014   Cervical stenosis of spine    Chronic back pain    Coronary artery disease    Diverticulosis    External hemorrhoids    Gastric ulcer    GERD (gastroesophageal reflux disease)    GI bleed    Hemorrhagic shock ([place])    Hyperlipidemia    Hypertension    Hypoxemia 06/20/2014   Insomnia    OSA (obstructive sleep apnea) 01/06/2019   Peptic ulcer    Pneumonia    RLS (restless legs syndrome) 06/20/2014   Snoring 06/20/2014   Unspecified deficiency anemia 07/14/2014    Past Surgical History:  Procedure Laterality Date    ANTERIOR CERVICAL DECOMP/DISCECTOMY FUSION N/A 10/03/2017   Procedure: ACDF - C4-C5 - C5-C6 - C6-C7;  Surgeon: Earnie Larsson, MD;  Location: [facility];  Service: Neurosurgery;  Laterality: N/A;   BACK SURGERY  10/29/2011   CARDIAC CATHETERIZATION     CORONARY STENT INTERVENTION N/A 01/30/2021   Procedure: CORONARY STENT INTERVENTION;  Surgeon: Nigel Mormon, MD;  Location: [facility];  Service: Cardiovascular;  Laterality: N/A;   ESOPHAGOGASTRODUODENOSCOPY N/A 06/04/2014   Procedure: ESOPHAGOGASTRODUODENOSCOPY (EGD);  Surgeon: Jerene Bears, MD;  Location: [facility] ENDOSCOPY;  Service: Endoscopy;  Laterality: N/A;   INTRAVASCULAR IMAGING/OCT N/A 01/30/2021   Procedure: INTRAVASCULAR IMAGING/OCT;  Surgeon: Nigel Mormon, MD;  Location: [facility];  Service: Cardiovascular;  Laterality: N/A;   LEFT HEART CATH AND CORONARY ANGIOGRAPHY N/A 01/30/2021   Procedure: LEFT HEART CATH AND CORONARY ANGIOGRAPHY;  Surgeon: Nigel Mormon, MD;  Location: [facility];  Service: Cardiovascular;  Laterality: N/A;    Current Outpatient Medications  Medication Sig Dispense Refill   acetaminophen (TYLENOL) 325 MG tablet Take 650 mg by mouth every 6 (six) hours as needed for moderate pain.     ALPRAZolam (XANAX) 1 MG tablet Take 1 mg by mouth at bedtime as needed for anxiety.     amitriptyline (ELAVIL) 50 MG tablet Take 50 mg by mouth at bedtime.     amLODipine (NORVASC) 5 MG tablet Take 5 mg by mouth daily.     clopidogrel (PLAVIX) 75 MG tablet Take 75 mg by mouth daily.     gabapentin (NEURONTIN) 100 MG capsule Take 100 mg by mouth at bedtime.     nebivolol (BYSTOLIC) 10 MG tablet Take 1 tablet (10 mg total) by mouth every evening. 30 tablet 0   nitroGLYCERIN (NITROSTAT) 0.4 MG SL tablet PLACE 1 TAB UNDER TONGUE EVERY 5 MINS AS NEEDED FOR CHEST PAIN. GO TO ER IS MORE THAN 2 TABS NEEDED (Patient taking differently: Place 0.4 mg under the tongue every 5 (five) minutes as needed for  chest pain.) 25 tablet 2   omeprazole (PRILOSEC) 40 MG capsule TAKE 1 CAPSULE BY MOUTH 2 TIMES DAILY BEFORE A MEAL. NEEDS OFFICE VISIT FOR FURTHER REFILLS (Patient taking differently: Take 40 mg by mouth in the morning and at bedtime.) 180 capsule 0   oxyCODONE-acetaminophen (PERCOCET) 10-325 MG tablet Take 1 tablet by mouth every 6 (six) hours as needed for pain.  0   ranolazine (RANEXA) 500 MG 12 hr tablet TAKE 1 TABLET BY MOUTH TWICE A DAY 60 tablet 5   rosuvastatin (CRESTOR) 40 MG tablet Take 1 tablet (40 mg total) by mouth at bedtime. 90 tablet 0   valsartan-hydrochlorothiazide (DIOVAN-HCT) 320-12.5 MG tablet Take 1 tablet by mouth daily.     VASCEPA 1 g capsule Take 2 capsules (2 g total) by mouth 2 (two) times daily. 120 capsule 2   No current facility-administered medications for this visit.    Allergies as of 10/09/2021   (No Known Allergies)    Vitals: BP 134/80    Pulse 63    Ht 5\' 6"  (1.676 m)    Wt 231 lb 8 oz (105 kg)    BMI 37.37 kg/m  Last Weight:  Wt Readings from Last 1 Encounters:  10/09/21 231 lb 8 oz (105 kg)   Last Height:   Ht Readings from Last 1 Encounters:  10/09/21 5\' 6"  (1.676 m)    Physical exam:  General: The patient is awake, alert and appears not in acute distress. The patient is well groomed. Facial hair, retrognathia.  Head: Normocephalic, atraumatic. Neck is supple. Mallampati  3, neck circumference:22 Cardiovascular:  Regular rate and rhythm, without  murmurs or carotid bruit, and without distended neck veins. Respiratory: Lungs are clear to auscultation. Skin:  Without evidence of edema, or rash.   Feet and toes, the hallux and all other toes are shiny- loss of hair to above  ankle level. Normal capillary reflux under all toenails, swelling at the hallucis joint.  Trunk: BMI is 37 elevated and patient  has normal posture.  Neurologic exam : The patient is awake and alert, oriented to place and time.  Memory subjective  described as intact.  There is a normal attention span & concentration ability. Speech is fluent without  dysarthria, dysphonia or aphasia. Mood and affect are appropriate.  Cranial nerves: Pupils are equal and briskly reactive to light. Funduscopic exam without  evidence of pallor or edema. Extraocular movements  in vertical and horizontal planes intact and without nystagmus.  Visual fields by finger perimetry are intact. Hearing to finger rub intact.  Facial sensation intact to fine touch. Facial motor strength is symmetric and tongue and uvula move midline. Tongue protrusion into either cheek is normal. Shoulder shrug is normal.   Motor exam:   Normal tone ,muscle bulk and symmetric  strength in all extremities.  Sensory:   Fine touch, pinprick and vibration were tested in all extremities. Proprioception was normal. There was vibration felt at both medial ankles and big toes - not significantly reduced.   Coordination: Rapid alternating movements in the fingers/hands were normal. Finger-to-nose maneuver  normal without evidence of ataxia, dysmetria or tremor.  Gait and station: Patient walks without assistive device . Proximal and distal strength within normal limits. Stance is stable.  Tandem gait is unfragmented.  Can walk on toes and heel.  Romberg testing is negative.   Deep tendon reflexes: in the upper and lower extremities are symmetric and intact. He has good, brisk patella and achilles reflexes- unusual in Neuropathy. Babinski maneuver response is  downgoing.   Assessment:  After physical and neurologic examination, review of laboratory studies, imaging, neurophysiology testing and pre-existing records, assessment is that of :  The patient has some neuropathic symptoms, but unusual preserved reflexes, preserved capillary refill, feet are warm to touch, but skin looks dystrophic, loss of leg hair and shiny slightly puffy skin on the dorsal feet.  Untreated severe sleep apnea persists , unable to  tolerate CPAP, unable to afford dental device ( 2015). Reports frequent arousals form sleep, nocturia. Dry mouth, headaches in AM.   RLS reported, can be neuropathic, can be V B12 deficiency and can be low iron, ferritin.   Plan:  Treatment plan and additional workup : I will certainly further investigate this kind of puzzling constellation of symptoms and signs.  I do not think this is a primary peripheral neuropathy I even wonder if it could be vasculitic and I will order the appropriate laboratory tests by blood.  In addition I like for the patient to have a nerve conduction EMG but it will certainly be in January and not in this calendar year.  I will asked Dr. Lavell Anchors to perform this test for me.     My next step is to repeat a home sleep test for this patient for simple reason he has known severe sleep apnea I doubt that it has improved but I think he may benefit from alternative treatments alternative to CPAP which has been the only attempt so far made.  His BMI is currently too high but if he could reduce his body weight by about 30 pounds over the next year he could be a candidate for inspire.  Again I have to look that he does not have central apnea #1 that he does not have hypoxemia and that it is not a strictly REM dependent apnea form he  is currently suffering from.  Cc PCP, R in 4 months with Np.    Asencion Partridge Eulalah Rupert MD 10/09/2021

## 2021-10-09 NOTE — Patient Instructions (Signed)
Neuropathic Pain °Neuropathic pain is pain caused by damage to the nerves that are responsible for certain sensations in your body (sensory nerves). °Neuropathic pain can make you more sensitive to pain. Even a minor sensation can feel very painful. This is usually a long-term (chronic) condition that can be difficult to treat. The type of pain differs from person to person. It may: °Start suddenly (acute), or it may develop slowly and become chronic. °Come and go as damaged nerves heal, or it may stay at the same level for years. °Cause emotional distress, loss of sleep, and a lower quality of life. °What are the causes? °The most common cause of this condition is diabetes. Many other diseases and conditions can also cause neuropathic pain. Causes of neuropathic pain can be classified as: °Toxic. This is caused by medicines and chemicals. The most common causes of toxic neuropathic pain is damage from medicines that kill cancer cells (chemotherapy) or alcohol abuse. °Metabolic. This can be caused by: °Diabetes. °Lack of vitamins like B12. °Traumatic. Any injury that cuts, crushes, or stretches a nerve can cause damage and pain. °Compression-related. If a sensory nerve gets trapped or compressed for a long period of time, the blood supply to the nerve can be cut off. °Vascular. Many blood vessel diseases can cause neuropathic pain by decreasing blood supply and oxygen to nerves. °Autoimmune. This type of pain results from diseases in which the body's defense system (immune system) mistakenly attacks sensory nerves. Examples of autoimmune diseases that can cause neuropathic pain include lupus and multiple sclerosis. °Infectious. Many types of viral infections can damage sensory nerves and cause pain. Shingles infection is a common cause of this type of pain. °Inherited. Neuropathic pain can be a symptom of many diseases that are passed down through families (genetic). °What increases the risk? °You are more likely to  develop this condition if: °You have diabetes. °You smoke. °You drink too much alcohol. °You are taking certain medicines, including chemotherapy or medicines that treat immune system disorders. °What are the signs or symptoms? °The main symptom is pain. Neuropathic pain is often described as: °Burning. °Shock-like. °Stinging. °Hot or cold. °Itching. °How is this diagnosed? °No single test can diagnose neuropathic pain. It is diagnosed based on: °A physical exam and your symptoms. Your health care provider will ask you about your pain. You may be asked to use a pain scale to describe how bad your pain is. °Tests. These may be done to see if you have a cause and location of any nerve damage. They include: °Nerve conduction studies and electromyography to test how well nerve signals travel through your nerves and muscles (electrodiagnostic testing). °Skin biopsy to evaluate for small fiber neuropathy. °Imaging studies, such as: °X-rays. °CT scan. °MRI. °How is this treated? °Treatment for neuropathic pain may change over time. You may need to try different treatment options or a combination of treatments. Some options include: °Treating the underlying cause of the neuropathy, such as diabetes, kidney disease, or vitamin deficiencies. °Stopping medicines that can cause neuropathy, such as chemotherapy. °Medicine to relieve pain. Medicines may include: °Prescription or over-the-counter pain medicine. °Anti-seizure medicine. °Antidepressant medicines. °Pain-relieving patches or creams that are applied to painful areas of skin. °A medicine to numb the area (local anesthetic), which can be injected as a nerve block. °Transcutaneous nerve stimulation. This uses electrical currents to block painful nerve signals. The treatment is painless. °Alternative treatments, such as: °Acupuncture. °Meditation. °Massage. °Occupational or physical therapy. °Pain management programs. °Counseling. °Follow   these instructions at  home: °Medicines ° °Take over-the-counter and prescription medicines only as told by your health care provider. °Ask your health care provider if the medicine prescribed to you: °Requires you to avoid driving or using machinery. °Can cause constipation. You may need to take these actions to prevent or treat constipation: °Drink enough fluid to keep your urine pale yellow. °Take over-the-counter or prescription medicines. °Eat foods that are high in fiber, such as beans, whole grains, and fresh fruits and vegetables. °Limit foods that are high in fat and processed sugars, such as fried or sweet foods. °Lifestyle ° °Have a good support system at home. °Consider joining a chronic pain support group. °Do not use any products that contain nicotine or tobacco. These products include cigarettes, chewing tobacco, and vaping devices, such as e-cigarettes. If you need help quitting, ask your health care provider. °Do not drink alcohol. °General instructions °Learn as much as you can about your condition. °Work closely with all your health care providers to find the treatment plan that works best for you. °Ask your health care provider what activities are safe for you. °Keep all follow-up visits. This is important. °Contact a health care provider if: °Your pain treatments are not working. °You are having side effects from your medicines. °You are struggling with tiredness (fatigue), mood changes, depression, or anxiety. °Get help right away if: °You have thoughts of hurting yourself. °Get help right away if you feel like you may hurt yourself or others, or have thoughts about taking your own life. Go to your nearest emergency room or: °Call 911. °Call the National Suicide Prevention Lifeline at 1-800-273-8255 or 988. This is open 24 hours a day. °Text the Crisis Text Line at 741741. °Summary °Neuropathic pain is pain caused by damage to the nerves that are responsible for certain sensations in your body (sensory  nerves). °Neuropathic pain may come and go as damaged nerves heal, or it may stay at the same level for years. °Neuropathic pain is usually a long-term condition that can be difficult to treat. Consider joining a chronic pain support group. °This information is not intended to replace advice given to you by your health care provider. Make sure you discuss any questions you have with your health care provider. °Document Revised: 06/11/2021 Document Reviewed: 06/11/2021 °Elsevier Patient Education © 2022 [employer] ° °

## 2021-10-09 NOTE — Addendum Note (Signed)
Addended by: [name] on: 10/09/2021 02:25 PM   Modules accepted: Orders

## 2021-10-11 NOTE — Progress Notes (Signed)
Not all labs have returned yet- all that have were normal:  Negative for hepatitis, C reactive protein, negative for  Sjgren, negative ANA ( rules out LUPUS ,etc) , normal homocystein, negative RPR.   Cc [place], [place]

## 2021-10-12 ENCOUNTER — Telehealth: Payer: Self-pay | Admitting: Neurology

## 2021-10-12 NOTE — Telephone Encounter (Signed)
Pt is asking for a call with results to blood work 

## 2021-10-15 NOTE — Telephone Encounter (Signed)
Called the pt and advised that there is one lab still pending but so far the labs completed have come back in normal range.  Advised that If the pending lab comes back abnormal, we will give him a cal. Pt verbalized understanding. Pt had no questions at this time but was encouraged to call back if questions arise.  "Not all labs have returned yet- all that have were normal:  Negative for hepatitis, C reactive protein, negative for  Sjgren, negative ANA ( rules out LUPUS ,etc) , normal homocystein, negative RPR."

## 2021-10-16 LAB — MULTIPLE MYELOMA PANEL, SERUM
Albumin SerPl Elph-Mcnc: 4.7 g/dL — ABNORMAL HIGH (ref 2.9–4.4)
Albumin/Glob SerPl: 1.9 — ABNORMAL HIGH (ref 0.7–1.7)
Alpha 1: 0.3 g/dL (ref 0.0–0.4)
Alpha2 Glob SerPl Elph-Mcnc: 0.6 g/dL (ref 0.4–1.0)
B-Globulin SerPl Elph-Mcnc: 1 g/dL (ref 0.7–1.3)
Gamma Glob SerPl Elph-Mcnc: 0.7 g/dL (ref 0.4–1.8)
Globulin, Total: 2.6 g/dL (ref 2.2–3.9)
IgA/Immunoglobulin A, Serum: 116 mg/dL (ref 90–386)
IgG (Immunoglobin G), Serum: 764 mg/dL (ref 603–1613)
IgM (Immunoglobulin M), Srm: 61 mg/dL (ref 20–172)
Total Protein: 7.3 g/dL (ref 6.0–8.5)

## 2021-10-16 LAB — HOMOCYSTEINE: Homocysteine: 13.2 umol/L (ref 0.0–14.5)

## 2021-10-16 LAB — HEPATITIS B SURFACE ANTIGEN: Hepatitis B Surface Ag: NEGATIVE

## 2021-10-16 LAB — C-REACTIVE PROTEIN: CRP: 4 mg/L (ref 0–10)

## 2021-10-16 LAB — SEDIMENTATION RATE: Sed Rate: 2 mm/hr (ref 0–30)

## 2021-10-16 LAB — ANA W/REFLEX: ANA Titer 1: NEGATIVE

## 2021-10-16 LAB — RPR: RPR Ser Ql: NONREACTIVE

## 2021-10-16 LAB — SJOGREN'S SYNDROME ANTIBODS(SSA + SSB)
ENA SSA (RO) Ab: 0.2 AI (ref 0.0–0.9)
ENA SSB (LA) Ab: <0.2 AI (ref 0.0–0.9)

## 2021-10-16 LAB — VITAMIN B1: Thiamine: 163.7 nmol/L (ref 66.5–200.0)

## 2021-10-16 NOTE — Telephone Encounter (Signed)
"  Complete serum panel , all results returned.  The serum phoresis shows elevated albumin and higher albumin over globulin index ( this can be related to level of hydration) , and no abnormality of bone marrow function or immune system is seen, especially no cancer."

## 2021-10-16 NOTE — Progress Notes (Signed)
Complete serum panel , all results returned.  The serum phoresis shows elevated albumin and higher albumin over globulin index ( this can be related to level of hydration) , and no abnormality of bone marrow function or immune system is seen, especially no cancer.

## 2021-10-17 ENCOUNTER — Telehealth: Payer: Self-pay | Admitting: Neurology

## 2021-10-17 NOTE — Telephone Encounter (Signed)
LVM for pt to call me back to schedule sleep study  

## 2021-10-19 ENCOUNTER — Other Ambulatory Visit: Payer: Self-pay | Admitting: Cardiology

## 2021-10-19 DIAGNOSIS — I2510 Atherosclerotic heart disease of native coronary artery without angina pectoris: Secondary | ICD-10-CM

## 2021-10-24 ENCOUNTER — Telehealth: Payer: Self-pay | Admitting: Neurology

## 2021-10-24 NOTE — Telephone Encounter (Signed)
LVM for pt to call me back to schedule sleep study  

## 2021-10-30 ENCOUNTER — Other Ambulatory Visit: Payer: Self-pay | Admitting: Cardiology

## 2021-10-30 ENCOUNTER — Other Ambulatory Visit: Payer: Self-pay

## 2021-10-30 DIAGNOSIS — I209 Angina pectoris, unspecified: Secondary | ICD-10-CM

## 2021-10-30 DIAGNOSIS — E782 Mixed hyperlipidemia: Secondary | ICD-10-CM

## 2021-10-30 DIAGNOSIS — I2510 Atherosclerotic heart disease of native coronary artery without angina pectoris: Secondary | ICD-10-CM

## 2021-11-01 ENCOUNTER — Telehealth: Payer: Self-pay | Admitting: Neurology

## 2021-11-01 NOTE — Telephone Encounter (Signed)
We have attempted to call the patient 2 times to schedule sleep study. Patient has been unavailable at the phone numbers we have on file and has not returned our calls. If patient calls back we will schedule them for their sleep study. ° °

## 2021-11-23 ENCOUNTER — Encounter: Payer: Self-pay | Admitting: Cardiology

## 2021-11-23 ENCOUNTER — Ambulatory Visit: Payer: Medicare Other | Admitting: Cardiology

## 2021-11-23 ENCOUNTER — Other Ambulatory Visit: Payer: Self-pay

## 2021-11-23 VITALS — BP 114/71 | HR 55 | Temp 98.2°F | Resp 16 | Ht 66.0 in | Wt 231.0 lb

## 2021-11-23 DIAGNOSIS — Z955 Presence of coronary angioplasty implant and graft: Secondary | ICD-10-CM

## 2021-11-23 DIAGNOSIS — G4733 Obstructive sleep apnea (adult) (pediatric): Secondary | ICD-10-CM

## 2021-11-23 DIAGNOSIS — I10 Essential (primary) hypertension: Secondary | ICD-10-CM

## 2021-11-23 DIAGNOSIS — I2510 Atherosclerotic heart disease of native coronary artery without angina pectoris: Secondary | ICD-10-CM

## 2021-11-23 DIAGNOSIS — E782 Mixed hyperlipidemia: Secondary | ICD-10-CM

## 2021-11-23 NOTE — Progress Notes (Signed)
Date:  11/23/2021   ID:  Joseph Fisher, DOB 02/03/1966, MRN [id number]  PCP:  Shanon Rosser, PA-C  Cardiologist:  Rex Kras, DO, [place] (established care 01/26/2021)  Date: 11/23/21 Last Office Visit: 10/04/2021  Chief Complaint  Patient presents with   Coronary Artery Disease   Follow-up    Hyperlipidemia and hypertriglyceridemia management   HPI  Joseph Fisher is a 56 y.o. male who presents to the office with a chief complaint of " hyperlipidemia and hypertriglyceridemia management." Patient's past medical history and cardiovascular risk factors include: CAD s/p PCI,  Hypertension, OSA not on CPAP, hyperlipidemia, obesity due to excess calories.  He is referred to the office at the request of Long, Nicki Reaper, PA-C for evaluation of chest pain.  During initial consultation in April 2022 is her symptoms of chest pain are very concerning for unstable angina.  Scheduled for an elective left heart catheterization and subsequently underwent angioplasty and stenting of the LAD.  Since then he has been symptom-free and has been working on improving his modifiable cardiovascular risk factors.  When he followed up for 32-monthvisit back in December 2022 it was noted that his lipids and triglycerides were not well controlled.  Given his ACS event in April 2022 recommended the importance of improving his modifiable cardiovascular risk factors.  We increased his Crestor and initiated Vascepa.  This is supposed to be a 7-week follow-up to reevaluate therapy and discuss any side effects that he may have had experienced.  Repeat labs are still pending (forgot to get them done).  No side effects to the higher dose of Crestor or Vascepa.  He has increase his physical activity by walking 2 miles 5 days a week compared to the last office visit.  His weight remains relatively stable.  He denies angina pectoris or heart failure symptoms.  No use of sublingual nitroglycerin tablets.  FUNCTIONAL STATUS: Walks  at least 2 miles per day (5 days a week) and also some resistance training at the gym.  ALLERGIES: No Known Allergies  MEDICATION LIST PRIOR TO VISIT: Current Meds  Medication Sig   acetaminophen (TYLENOL) 325 MG tablet Take 650 mg by mouth every 6 (six) hours as needed for moderate pain.   ALPRAZolam (XANAX) 1 MG tablet Take 1 mg by mouth at bedtime as needed for anxiety.   amLODipine (NORVASC) 5 MG tablet Take 5 mg by mouth daily.   clopidogrel (PLAVIX) 75 MG tablet TAKE 8 TABLETS (600MG) ONLY ON FRIDAY (02/02/21) THEN START TAKING 1 TABLET DAILY 02/03/21   gabapentin (NEURONTIN) 100 MG capsule Take 100 mg by mouth at bedtime.   nebivolol (BYSTOLIC) 10 MG tablet Take 1 tablet (10 mg total) by mouth every evening.   nitroGLYCERIN (NITROSTAT) 0.4 MG SL tablet PLACE 1 TAB UNDER TONGUE EVERY 5 MINS AS NEEDED FOR CHEST PAIN. GO TO ER IS MORE THAN 2 TABS NEEDED (Patient taking differently: Place 0.4 mg under the tongue every 5 (five) minutes as needed for chest pain.)   omeprazole (PRILOSEC) 40 MG capsule TAKE 1 CAPSULE BY MOUTH 2 TIMES DAILY BEFORE A MEAL. NEEDS OFFICE VISIT FOR FURTHER REFILLS (Patient taking differently: Take 40 mg by mouth in the morning and at bedtime.)   oxyCODONE-acetaminophen (PERCOCET) 10-325 MG tablet Take 1 tablet by mouth every 6 (six) hours as needed for pain.   ranolazine (RANEXA) 500 MG 12 hr tablet TAKE 1 TABLET BY MOUTH TWICE A DAY   rosuvastatin (CRESTOR) 40 MG tablet TAKE 1 TABLET  BY MOUTH EVERYDAY AT BEDTIME   valsartan-hydrochlorothiazide (DIOVAN-HCT) 320-12.5 MG tablet Take 1 tablet by mouth daily.   VASCEPA 1 g capsule Take 2 capsules (2 g total) by mouth 2 (two) times daily.     PAST MEDICAL HISTORY: Past Medical History:  Diagnosis Date   Anemia, unspecified 07/14/2014   Barrett's esophagus    Blood transfusion without reported diagnosis 05/2014   Cervical stenosis of spine    Chronic back pain    Coronary artery disease    Diverticulosis     External hemorrhoids    Gastric ulcer    GERD (gastroesophageal reflux disease)    GI bleed    Hemorrhagic shock (HCC)    Hyperlipidemia    Hypertension    Hypoxemia 06/20/2014   Insomnia    OSA (obstructive sleep apnea) 01/06/2019   Peptic ulcer    Pneumonia    RLS (restless legs syndrome) 06/20/2014   Snoring 06/20/2014   Unspecified deficiency anemia 07/14/2014    PAST SURGICAL HISTORY: Past Surgical History:  Procedure Laterality Date   ANTERIOR CERVICAL DECOMP/DISCECTOMY FUSION N/A 10/03/2017   Procedure: ACDF - C4-C5 - C5-C6 - C6-C7;  Surgeon: Earnie Larsson, MD;  Location: [facility];  Service: Neurosurgery;  Laterality: N/A;   BACK SURGERY  10/29/2011   CARDIAC CATHETERIZATION     CORONARY STENT INTERVENTION N/A 01/30/2021   Procedure: CORONARY STENT INTERVENTION;  Surgeon: Nigel Mormon, MD;  Location: [facility];  Service: Cardiovascular;  Laterality: N/A;   ESOPHAGOGASTRODUODENOSCOPY N/A 06/04/2014   Procedure: ESOPHAGOGASTRODUODENOSCOPY (EGD);  Surgeon: Jerene Bears, MD;  Location[facility];  Service: Endoscopy;  Laterality: N/A;   INTRAVASCULAR IMAGING/OCT N/A 01/30/2021   Procedure: INTRAVASCULAR IMAGING/OCT;  Surgeon: Nigel Mormon, MD;  Location: [facility];  Service: Cardiovascular;  Laterality: N/A;   LEFT HEART CATH AND CORONARY ANGIOGRAPHY N/A 01/30/2021   Procedure: LEFT HEART CATH AND CORONARY ANGIOGRAPHY;  Surgeon: Nigel Mormon, MD;  Location: [place] [facility];  Service: Cardiovascular;  Laterality: N/A;    FAMILY HISTORY: The patient family history includes Hypertension in his father and mother.  SOCIAL HISTORY:  The patient  reports that he has never smoked. He has never used smokeless tobacco. He reports that he does not currently use alcohol. He reports that he does not use drugs.  REVIEW OF SYSTEMS: Review of Systems  Constitutional: Negative for chills, fever and malaise/fatigue.  HENT:  Negative for hoarse  voice and nosebleeds.   Eyes:  Negative for discharge, double vision and pain.  Cardiovascular:  Negative for chest pain, claudication, dyspnea on exertion, leg swelling, near-syncope, orthopnea, palpitations, paroxysmal nocturnal dyspnea and syncope.  Respiratory:  Negative for hemoptysis and shortness of breath.   Musculoskeletal:  Negative for muscle cramps and myalgias.  Gastrointestinal:  Negative for abdominal pain, constipation, diarrhea, hematemesis, hematochezia, melena, nausea and vomiting.  Neurological:  Negative for dizziness and light-headedness.   PHYSICAL EXAM: Vitals with BMI 11/23/2021 10/09/2021 10/04/2021  Height '5\' 6"'  '5\' 6"'  '5\' 7"'   Weight 231 lbs 231 lbs 8 oz 234 lbs 5 oz  BMI 37.3 48.54 62.70  Systolic 350 093 818  Diastolic 71 80 84  Pulse 55 63 64   CONSTITUTIONAL: Well-developed and well-nourished. No acute distress.  SKIN: Skin is warm and dry. No rash noted. No cyanosis. No pallor. No jaundice HEAD: Normocephalic and atraumatic.  EYES: No scleral icterus MOUTH/THROAT: Moist oral membranes.  NECK: No JVD present. No thyromegaly noted. No carotid bruits  LYMPHATIC: No visible cervical adenopathy.  CHEST Normal respiratory effort. No intercostal retractions  LUNGS: Clear to auscultation bilaterally.  No stridor. No wheezes. No rales.  CARDIOVASCULAR: Regular rate and rhythm, positive S1-S2, no murmurs rubs or gallops appreciated ABDOMINAL: Obese, soft, nontender, nondistended, positive bowel sounds in all 4 quadrants no apparent ascites.  EXTREMITIES: No peripheral edema  HEMATOLOGIC: No significant bruising NEUROLOGIC: Oriented to person, place, and time. Nonfocal. Normal muscle tone.  PSYCHIATRIC: Normal mood and affect. Normal behavior. Cooperative  CARDIAC DATABASE: EKG: 10/04/2021: Sinus bradycardia, 59 bpm, nonspecific T wave abnormality, without underlying injury pattern.   Echocardiogram: 03/06/2021:  Left ventricle cavity is normal in size and wall  thickness. Normal global wall motion. Normal LV systolic function with EF 67%. Normal diastolic filling pattern.  No significant valvular abnormality.  Normal right atrial pressure.    Stress Testing: No results found for this or any previous visit from the past 1095 days.   Heart Catheterization: 01/30/2021: LM: Normal LAD: Proximal 90% stenosis         Diagonal 1 proximal 60% stenosis         Distal apical 90% stenosis in a small caliber vessel LCx: Normal RCA: Distal 20%, RPDA 20% stenosis  LABORATORY DATA: CBC Latest Ref Rng & Units 01/30/2021 09/29/2017 10/31/2014  WBC 4.0 - 10.5 K/uL 4.7 3.9(L) 3.4(L)  Hemoglobin 13.0 - 17.0 g/dL 15.2 15.0 14.4  Hematocrit 39.0 - 52.0 % 44.5 44.0 42.5  Platelets 150 - 400 K/uL 189 152 128(L)    CMP Latest Ref Rng & Units 10/09/2021 10/04/2021 01/26/2021  Glucose 70 - 99 mg/dL - 148(H) 125(H)  BUN 6 - 24 mg/dL - 12 14  Creatinine 0.76 - 1.27 mg/dL - 0.93 0.93  Sodium 134 - 144 mmol/L - 143 142  Potassium 3.5 - 5.2 mmol/L - 4.3 4.4  Chloride 96 - 106 mmol/L - 103 103  CO2 20 - 29 mmol/L - 24 24  Calcium 8.7 - 10.2 mg/dL - 9.2 9.4  Total Protein 6.0 - 8.5 g/dL 7.3 6.8 -  Total Bilirubin 0.0 - 1.2 mg/dL - 0.4 -  Alkaline Phos 44 - 121 IU/L - 50 -  AST 0 - 40 IU/L - 14 -  ALT 0 - 44 IU/L - 20 -    Lipid Panel     Component Value Date/Time   CHOL 140 10/04/2021 1134   TRIG 255 (H) 10/04/2021 1134   HDL 44 10/04/2021 1134   LDLCALC 56 10/04/2021 1134   LDLDIRECT 55 10/04/2021 1134   LABVLDL 40 10/04/2021 1134    No components found for: NTPROBNP  Recent Labs    01/26/21 1422  PROBNP 95   No results for input(s): TSH in the last 8760 hours.  BMP Recent Labs    01/26/21 1422 10/04/21 1134  NA 142 143  K 4.4 4.3  CL 103 103  CO2 24 24  GLUCOSE 125* 148*  BUN 14 12  CREATININE 0.93 0.93  CALCIUM 9.4 9.2    HEMOGLOBIN A1C No results found for: HGBA1C, MPG  External Labs: Collected: 01/13/2021, provided by  PCP Hemoglobin 14.1 g/dL, hematocrit 41.1% Platelets 147 Creatinine 0.84 mg/dL. eGFR: 99 mL/min per 1.73 m Potassium 3.8 AST 21, ALT 27, alkaline phosphatase 53 Hemoglobin A1c: 6.1  External Labs: Collected: 07/09/2021 provided by the patient. Hemoglobin A1c 5.9. Hemoglobin 14.5 g/dL, hematocrit 42.8%. Platelets 150.  07/05/2021 total cholesterol 137, HDL 41, triglycerides 312, LDL 63, non-HDL 96   IMPRESSION:  ICD-10-CM   1. Atherosclerosis of native coronary artery of native heart without angina pectoris  I25.10 Lipid Panel With LDL/HDL Ratio    LDL cholesterol, direct    CMP14+EGFR    2. History of coronary angioplasty with insertion of stent  Z95.5     3. Mixed hyperlipidemia  E78.2 Lipid Panel With LDL/HDL Ratio    LDL cholesterol, direct    CMP14+EGFR    4. Benign essential hypertension  I10     5. OSA (obstructive sleep apnea)  G47.33     6. Class 2 severe obesity due to excess calories with serious comorbidity and body mass index (BMI) of 37.0 to 37.9 in adult [facility])  E66.01    Z68.37        RECOMMENDATIONS: MANJOT HINKS is a 56 y.o. male whose past medical history and cardiac risk factors include:  CAD s/p PCI,  Hypertension, OSA not on CPAP, hyperlipidemia, obesity due to excess calories.  Atherosclerosis of the native coronary arteries without angina pectoris status post angioplasty/PCI: Chest pain-free. Tolerating dual antiplatelet therapy without any side effects. Left heart catheterization April 2022: PCI to the LAD EKG: Sinus bradycardia without underlying injury pattern. Labs from September 2022 noted suboptimal control of lipids and triglycerides. At the last visit Crestor increased to 40 mg p.o. nightly and he was started on Vascepa.  He tolerated both of the medications well without any side effects or intolerances. Will reorder fasting lipid profile and CMP to reevaluate therapy and liver function. As long as the lipids and triglycerides are  improving I would like to see him back in his regular 25-monthappointments. Educated on the importance of secondary prevention. -Educated on the importance of blood pressure management with a goal systolic blood pressure of 130 mmHg on average. -Check fasting lipid profile to reevaluate statin therapy. -Educated importance of glycemic control -Educated on importance of weight loss, dietary restrictions, and a low-salt diet -Encouraged to follow-up with his sleep medicine provider to start CPAP  Benign essential hypertension: Office blood pressures within acceptable range. Medications reconciled. Low-salt diet recommended.  Mixed hyperlipidemia: Recent labs from 06/2021 provided by PCP reviewed.  LDL currently not at goal given his CAD. Currently on Crestor 40 mg p.o. nightly.  FINAL MEDICATION LIST END OF ENCOUNTER: No orders of the defined types were placed in this encounter.   Medications Discontinued During This Encounter  Medication Reason   amitriptyline (ELAVIL) 50 MG tablet      Current Outpatient Medications:    acetaminophen (TYLENOL) 325 MG tablet, Take 650 mg by mouth every 6 (six) hours as needed for moderate pain., Disp: , Rfl:    ALPRAZolam (XANAX) 1 MG tablet, Take 1 mg by mouth at bedtime as needed for anxiety., Disp: , Rfl:    amLODipine (NORVASC) 5 MG tablet, Take 5 mg by mouth daily., Disp: , Rfl:    clopidogrel (PLAVIX) 75 MG tablet, TAKE 8 TABLETS (600MG) ONLY ON FRIDAY (02/02/21) THEN START TAKING 1 TABLET DAILY 02/03/21, Disp: 90 tablet, Rfl: 3   gabapentin (NEURONTIN) 100 MG capsule, Take 100 mg by mouth at bedtime., Disp: , Rfl:    nebivolol (BYSTOLIC) 10 MG tablet, Take 1 tablet (10 mg total) by mouth every evening., Disp: 30 tablet, Rfl: 0   nitroGLYCERIN (NITROSTAT) 0.4 MG SL tablet, PLACE 1 TAB UNDER TONGUE EVERY 5 MINS AS NEEDED FOR CHEST PAIN. GO TO ER IS MORE THAN 2 TABS NEEDED (Patient taking differently: Place 0.4 mg under the tongue every  5 (five)  minutes as needed for chest pain.), Disp: 25 tablet, Rfl: 2   omeprazole (PRILOSEC) 40 MG capsule, TAKE 1 CAPSULE BY MOUTH 2 TIMES DAILY BEFORE A MEAL. NEEDS OFFICE VISIT FOR FURTHER REFILLS (Patient taking differently: Take 40 mg by mouth in the morning and at bedtime.), Disp: 180 capsule, Rfl: 0   oxyCODONE-acetaminophen (PERCOCET) 10-325 MG tablet, Take 1 tablet by mouth every 6 (six) hours as needed for pain., Disp: , Rfl: 0   ranolazine (RANEXA) 500 MG 12 hr tablet, TAKE 1 TABLET BY MOUTH TWICE A DAY, Disp: 180 tablet, Rfl: 1   rosuvastatin (CRESTOR) 40 MG tablet, TAKE 1 TABLET BY MOUTH EVERYDAY AT BEDTIME, Disp: 90 tablet, Rfl: 0   valsartan-hydrochlorothiazide (DIOVAN-HCT) 320-12.5 MG tablet, Take 1 tablet by mouth daily., Disp: , Rfl:    VASCEPA 1 g capsule, Take 2 capsules (2 g total) by mouth 2 (two) times daily., Disp: 120 capsule, Rfl: 2  Orders Placed This Encounter  Procedures   Lipid Panel With LDL/HDL Ratio   LDL cholesterol, direct   CMP14+EGFR    There are no Patient Instructions on file for this visit.   --Continue cardiac medications as reconciled in final medication list. --Return in about 6 months (around 05/23/2022) for Follow up, CAD. Or sooner if needed. --Continue follow-up with your primary care physician regarding the management of your other chronic comorbid conditions.  Patient's questions and concerns were addressed to his satisfaction. He voices understanding of the instructions provided during this encounter.   This note was created using a voice recognition software as a result there may be grammatical errors inadvertently enclosed that do not reflect the nature of this encounter. Every attempt is made to correct such errors.  Rex Kras, [place], [facility]  Pager: [phone] Office: [phone]

## 2021-11-27 ENCOUNTER — Telehealth: Payer: Self-pay | Admitting: Cardiology

## 2021-11-27 ENCOUNTER — Telehealth: Payer: Self-pay

## 2021-11-27 NOTE — Telephone Encounter (Signed)
Patient had a few questions/concerns regarding a medication he was put on at last month's appointment and wanted to discuss. Patient can be reached at [phone].

## 2021-11-27 NOTE — Telephone Encounter (Signed)
Spoke to patient message has been sent to Provider

## 2021-11-30 NOTE — Telephone Encounter (Signed)
See if you get hold off him.  I called him and left a voicemail.  Dr. Terri Skains

## 2021-12-04 LAB — CMP14+EGFR
ALT: 22 IU/L (ref 0–44)
AST: 17 IU/L (ref 0–40)
Albumin/Globulin Ratio: 2.6 — ABNORMAL HIGH (ref 1.2–2.2)
Albumin: 4.9 g/dL (ref 3.8–4.9)
Alkaline Phosphatase: 50 IU/L (ref 44–121)
BUN/Creatinine Ratio: 10 (ref 9–20)
BUN: 11 mg/dL (ref 6–24)
Bilirubin Total: 0.5 mg/dL (ref 0.0–1.2)
CO2: 23 mmol/L (ref 20–29)
Calcium: 9.7 mg/dL (ref 8.7–10.2)
Chloride: 105 mmol/L (ref 96–106)
Creatinine, Ser: 1.07 mg/dL (ref 0.76–1.27)
Globulin, Total: 1.9 g/dL (ref 1.5–4.5)
Glucose: 116 mg/dL — ABNORMAL HIGH (ref 70–99)
Potassium: 4.3 mmol/L (ref 3.5–5.2)
Sodium: 145 mmol/L — ABNORMAL HIGH (ref 134–144)
Total Protein: 6.8 g/dL (ref 6.0–8.5)
eGFR: 82 mL/min/{1.73_m2} (ref >59)

## 2021-12-04 LAB — LIPID PANEL WITH LDL/HDL RATIO
Cholesterol, Total: 103 mg/dL (ref 100–199)
HDL: 45 mg/dL (ref >39)
LDL Chol Calc (NIH): 32 mg/dL (ref 0–99)
LDL/HDL Ratio: 0.7 ratio (ref 0.0–3.6)
Triglycerides: 155 mg/dL — ABNORMAL HIGH (ref 0–149)
VLDL Cholesterol Cal: 26 mg/dL (ref 5–40)

## 2021-12-04 LAB — LDL CHOLESTEROL, DIRECT: LDL Direct: 32 mg/dL (ref 0–99)

## 2021-12-05 NOTE — Telephone Encounter (Signed)
Crestor was increased back in dec 2022, not in Jan 2023.  Vascepa was added back in dec 2022, not in Jan 2023. Nausea is quite nonspecific as it can be caused by multiple things.  Doubt that its either Crestor or Vascepa. With regards to body aches it could be Crestor.  He can reduce dose of Crestor to 20 mg p.o. nightly and reevaluate.

## 2021-12-05 NOTE — Telephone Encounter (Signed)
Called him today as well and left a vm.  Dr. Terri Skains

## 2021-12-06 ENCOUNTER — Ambulatory Visit (INDEPENDENT_AMBULATORY_CARE_PROVIDER_SITE_OTHER): Payer: Medicare Other | Admitting: Neurology

## 2021-12-06 DIAGNOSIS — G2581 Restless legs syndrome: Secondary | ICD-10-CM

## 2021-12-06 DIAGNOSIS — M79604 Pain in right leg: Secondary | ICD-10-CM

## 2021-12-06 DIAGNOSIS — M79605 Pain in left leg: Secondary | ICD-10-CM

## 2021-12-06 DIAGNOSIS — E7841 Elevated Lipoprotein(a): Secondary | ICD-10-CM

## 2021-12-06 DIAGNOSIS — I209 Angina pectoris, unspecified: Secondary | ICD-10-CM

## 2021-12-06 DIAGNOSIS — G8929 Other chronic pain: Secondary | ICD-10-CM

## 2021-12-06 DIAGNOSIS — Z8739 Personal history of other diseases of the musculoskeletal system and connective tissue: Secondary | ICD-10-CM

## 2021-12-06 DIAGNOSIS — M5416 Radiculopathy, lumbar region: Secondary | ICD-10-CM

## 2021-12-06 DIAGNOSIS — M792 Neuralgia and neuritis, unspecified: Secondary | ICD-10-CM

## 2021-12-06 DIAGNOSIS — M5442 Lumbago with sciatica, left side: Secondary | ICD-10-CM | POA: Diagnosis not present

## 2021-12-06 DIAGNOSIS — M5441 Lumbago with sciatica, right side: Secondary | ICD-10-CM

## 2021-12-06 DIAGNOSIS — G5732 Lesion of lateral popliteal nerve, left lower limb: Secondary | ICD-10-CM | POA: Diagnosis not present

## 2021-12-06 DIAGNOSIS — R519 Headache, unspecified: Secondary | ICD-10-CM

## 2021-12-06 NOTE — Progress Notes (Addendum)
History: Per patient report, patient has bilateral lower extremity foot pain.  His feet are numb and tingling.  He also has back pain which shoots into his legs all the way down the leg to the feet.  His toes are numb and tingling in both legs, symmetrical.  He denies any pain in the knees.  He denies crossing his legs a lot.  He has a history of lumbar radiculopathy and lumbar surgery in the past for similar.  Current symptoms started a few years ago, the toes are numb worse when laying down and its mostly the big toe and on top of the toes.  He also used to drink a 12 pack every week for 20 years.  He states that he had a hemoglobin A1c completed which was normal.  Numbness in the toes can improve positionally.  He states when he has numbness in the toes it corresponds to the low back pain and radicular symptoms.  His past back surgery was focused on L4-L5 on the left.  He states his back pain is worse on the right now.  His low back pain is worse on the right and he can feel nerve radiation from the low back all the way down to the numb toes.  I reviewed EMG/NCS results with patient: There is reduced amplitude of the bilateral peroneal motor nerves along with normal sensory conductions suggestive of bilateral L5/S1 radiculopathy. EMG needle study was however normal which could indicate that these are remote findings. Recommend MRI of the lumbar spine as clinically warranted.  Please see additional note dated same day with my feedback to patient.  -Given patient's clinical and test findings, recommend an MRI of the lumbar spine as clinically warranted. He declines physical therapy. He has also recently been to see Dr. Nelva Bush who recommended lumbar injections and I encourage patient to do this.  Findings on EMG nerve conduction study may be remote.  However since my colleague Dr. Brett Fairy is out of the office I will order MRI of the lumbar spine.  -Numbness in the feet may also be due to history of alcohol  use, he reports a 12 pack a week for decades, unclear association, he denies any diabetes states his hemoglobin A1c is normal.  - I answered patient's questions.  Patient has tried conservative measures for more than 6 weeks, he has had injections in his low back, he has been to orthopedics, symptoms are worsening, I recommend MRI of the lumbar spine and in my colleague's absence I can order that.  Orders Placed This Encounter  Procedures   MR LUMBAR SPINE WO CONTRAST   I spent 20 minutes of face-to-face and non-face-to-face time with patient on the  1. Lumbar radiculopathy   2. Pain in both lower extremities   3. Chronic bilateral low back pain with bilateral sciatica   4. Peroneal neuropathy at knee, left    diagnosis.  This included previsit chart review, lab review, study review, order entry, electronic health record documentation, patient education on the different diagnostic and therapeutic options, counseling and coordination of care, risks and benefits of management, compliance, or risk factor reduction

## 2021-12-10 ENCOUNTER — Other Ambulatory Visit: Payer: Self-pay | Admitting: Cardiology

## 2021-12-10 DIAGNOSIS — I2510 Atherosclerotic heart disease of native coronary artery without angina pectoris: Secondary | ICD-10-CM

## 2021-12-10 NOTE — Procedures (Addendum)
Full Name: Joseph Fisher             Gender:           Male MRN #:       [id number]                   Date of Birth:  October 08, 1966    Visit Date:                     12/06/2021 08:43 Age:                               56 Years Examining Physician:  [name], MD  Referring Physician:    [name], MD Height:                           5 feet 6 inch Patient History:            231lbs   History: Bilateral lower extremity foot pain with low back pain and radicular symptoms radiating from the low back into his legs and down to the feet.    Summary: Nerve conduction studies were performed on the bilateral lower extremities.   The left peroneal motor nerve (recording from the EDB) showed no response  The left peroneal motor nerve (recording from the tibialis anterior) showed reduced amplitude(38mV, N>3). The right peroneal motor nerve (recording from the tibialis anterior) showed reduced amplitude(14mV, N>3). All remaining nerves (as indicated in the following tables) were within normal limits.  All muscles (as indicated in the following tables) were within normal limits.  EMG needle examination could not be performed in the lumbar paraspinals as these are unreliable after surgery.   Conclusion: There is reduced amplitude of the bilateral peroneal motor nerves along with normal sensory conductions suggestive of bilateral L5/S1 radiculopathy. EMG needle study was however normal which could indicate that these are remote findings. Recommend MRI of the lumbar spine as clinically warranted.  Please see additional note dated same day with my feedback to patient.   [name] M.D.   [facility] [address] [place], [place] [address] Tel: [phone] Fax: [phone]   Verbal informed consent was obtained from the patient, patient was informed of potential risk of procedure, including bruising, bleeding, hematoma formation, infection, muscle weakness, muscle  pain, numbness, among others.           [place]    Nerve / Sites Muscle Latency Ref. Amplitude Ref. Rel Amp Segments Distance Velocity Ref. Area      ms ms mV mV %   cm m/s m/s mVms  R Peroneal - EDB     Ankle EDB 4.2 ?6.5 3.3 ?2.0 100 Ankle - EDB 9     12.0     Fib head EDB 9.5   6.6   203 Fib head - Ankle 26 49 ?44 27.4     Pop fossa EDB 11.5   6.5   98.2 Pop fossa - Fib head 10 49 ?44 23.3                Pop fossa - Ankle          L Peroneal - EDB     Ankle EDB NR ?6.5 NR ?2.0 NR Ankle - EDB 9     NR     Fib head  EDB 9.4   4.8     Fib head - Ankle 27 NR ?44 18.9     Pop fossa EDB 11.7   4.7   98 Pop fossa - Fib head 10 44 ?44 21.3                Pop fossa - Ankle          R Tibial - AH     Ankle AH 2.9 ?5.8 3.9 ?4.0 100 Ankle - AH 9     15.5     Pop fossa AH 11.9   2.4   63 Pop fossa - Ankle 39 43 ?41 13.2  L Tibial - AH     Ankle AH 3.5 ?5.8 9.4 ?4.0 100 Ankle - AH 9     24.3     Pop fossa AH 12.2   8.9   94.8 Pop fossa - Ankle 38 43 ?41 23.7  L Peroneal - Tib Ant     Fib Head Tib Ant 4.6 ?4.7 1.0 ?3.0 100 Fib Head - Tib Ant 10     3.3     Pop fossa Tib Ant 6.1   0.9   84.9 Pop fossa - Fib Head 10 66 ?44 3.4  R Peroneal - Tib Ant     Fib Head Tib Ant 4.1 ?4.7 1.0 ?3.0 100 Fib Head - Tib Ant 10     6.2     Pop fossa Tib Ant 6.3   0.3   29.8 Pop fossa - Fib Head 10 45 ?44 1.8                 SSR    Nerve / Sites Latency    s  R Sympathetic - Foot     Foot 2.10       SNC    Nerve / Sites Rec. Site Peak Lat Ref.  Amp Ref. Segments Distance      ms ms V V   cm  R Sural - Ankle (Calf)     Calf Ankle 3.7 ?4.4 7 ?6 Calf - Ankle 14  L Sural - Ankle (Calf)     Calf Ankle 3.8 ?4.4 10 ?6 Calf - Ankle 14  R Superficial peroneal - Ankle     Lat leg Ankle 3.2 ?4.4 10 ?6 Lat leg - Ankle 14  L Superficial peroneal - Ankle     Lat leg Ankle 3.7 ?4.4 8 ?6 Lat leg - Ankle 14             F  Wave    Nerve F Lat Ref.    ms ms  R Tibial - AH 52.6 ?56.0  L Tibial - AH 50.9 ?56.0                     EMG Summary Table      Spontaneous MUAP Recruitment  Muscle IA Fib PSW Fasc Other Amp Dur. Poly Pattern  L. Vastus medialis Normal None None None _______ Normal Normal Normal Normal  R. Vastus medialis Normal None None None _______ Normal Normal Normal Normal  L. Tibialis anterior Normal None None None _______ Normal Normal Normal Normal  R. Tibialis anterior Normal None None None _______ Normal Normal Normal Normal  L. Gastrocnemius (Medial head) Normal None None None _______ Normal Normal Normal Normal  R. Gastrocnemius (Medial head) Normal None None None _______ Normal Normal Normal Normal  L. Peroneus longus Normal None None None _______ Normal  Normal Normal Normal  R. Peroneus longus Normal None None None _______ Normal Normal Normal Normal  L. Extensor hallucis longus Normal None None None _______ Normal Normal Normal Normal  R. Extensor hallucis longus Normal None None None _______ Normal Normal Normal Normal  L. Abductor hallucis Normal None None None _______ Normal Normal Normal Normal  R. Abductor hallucis Normal None None None _______ Normal Normal Normal Normal  L. Gluteus maximus Normal None None None _______ Normal Normal Normal Normal  R. Gluteus maximus Normal None None None _______ Normal Normal Normal Normal  L. Gluteus medius Normal None None None _______ Normal Normal Normal Normal  R. Gluteus medius Normal None None None _______ Normal Normal Normal Normal

## 2021-12-10 NOTE — Progress Notes (Addendum)
Full Name: Joseph Fisher Gender: Male MRN #: [id number] Date of Birth: 1966-04-04    Visit Date: 12/06/2021 08:43 Age: 56 Years Examining Physician: [name], MD  Referring Physician: [name], MD Height: 5 feet 6 inch Patient History: 231lbs  History: Bilateral lower extremity foot pain with low back pain and radicular symptoms radiating from the low back into his legs and down to the feet.   Summary: Nerve conduction studies were performed on the bilateral lower extremities.   The left peroneal motor nerve (recording from the EDB) showed no response  The left peroneal motor nerve (recording from the tibialis anterior) showed reduced amplitude(30mV, N>3). The right peroneal motor nerve (recording from the tibialis anterior) showed reduced amplitude(31mV, N>3). All remaining nerves (as indicated in the following tables) were within normal limits.  All muscles (as indicated in the following tables) were within normal limits.  EMG needle examination could not be performed in the lumbar paraspinals as these are unreliable after surgery.  Conclusion: There is reduced amplitude of the bilateral peroneal motor nerves along with normal sensory conductions suggestive of bilateral L5/S1 radiculopathy. EMG needle study was however normal which could indicate that these are remote findings. Recommend MRI of the lumbar spine as clinically warranted.  Please see additional note dated same day with my feedback to patient.  [name] M.D.  [facility] [address], [place], [place] [address] Tel: [phone] Fax: [phone]  Verbal informed consent was obtained from the patient, patient was informed of potential risk of procedure, including bruising, bleeding, hematoma formation, infection, muscle weakness, muscle pain, numbness, among others.        [place]    Nerve / Sites Muscle Latency Ref. Amplitude Ref. Rel Amp Segments Distance Velocity Ref. Area     ms ms mV mV %  cm m/s m/s mVms  R Peroneal - EDB     Ankle EDB 4.2 ?6.5 3.3 ?2.0 100 Ankle - EDB 9   12.0     Fib head EDB 9.5  6.6  203 Fib head - Ankle 26 49 ?44 27.4     Pop fossa EDB 11.5  6.5  98.2 Pop fossa - Fib head 10 49 ?44 23.3         Pop fossa - Ankle      L Peroneal - EDB     Ankle EDB NR ?6.5 NR ?2.0 NR Ankle - EDB 9   NR     Fib head EDB 9.4  4.8   Fib head - Ankle 27 NR ?44 18.9     Pop fossa EDB 11.7  4.7  98 Pop fossa - Fib head 10 44 ?44 21.3         Pop fossa - Ankle      R Tibial - AH     Ankle AH 2.9 ?5.8 3.9 ?4.0 100 Ankle - AH 9   15.5     Pop fossa AH 11.9  2.4  63 Pop fossa - Ankle 39 43 ?41 13.2  L Tibial - AH     Ankle AH 3.5 ?5.8 9.4 ?4.0 100 Ankle - AH 9   24.3     Pop fossa AH 12.2  8.9  94.8 Pop fossa - Ankle 38 43 ?41 23.7  L Peroneal - Tib Ant     Fib Head Tib Ant 4.6 ?4.7 1.0 ?3.0 100 Fib Head - Tib Ant 10   3.3  Pop fossa Tib Ant 6.1  0.9  84.9 Pop fossa - Fib Head 10 66 ?44 3.4  R Peroneal - Tib Ant     Fib Head Tib Ant 4.1 ?4.7 1.0 ?3.0 100 Fib Head - Tib Ant 10   6.2     Pop fossa Tib Ant 6.3  0.3  29.8 Pop fossa - Fib Head 10 45 ?44 1.8                 SSR    Nerve / Sites Latency   s  R Sympathetic - Foot     Foot 2.10       SNC    Nerve / Sites Rec. Site Peak Lat Ref.  Amp Ref. Segments Distance    ms ms V V  cm  R Sural - Ankle (Calf)     Calf Ankle 3.7 ?4.4 7 ?6 Calf - Ankle 14  L Sural - Ankle (Calf)     Calf Ankle 3.8 ?4.4 10 ?6 Calf - Ankle 14  R Superficial peroneal - Ankle     Lat leg Ankle 3.2 ?4.4 10 ?6 Lat leg - Ankle 14  L Superficial peroneal - Ankle     Lat leg Ankle 3.7 ?4.4 8 ?6 Lat leg - Ankle 14             F  Wave    Nerve F Lat Ref.   ms ms  R Tibial - AH 52.6 ?56.0  L Tibial - AH 50.9 ?56.0         EMG Summary Table    Spontaneous MUAP Recruitment  Muscle IA Fib PSW Fasc Other Amp Dur. Poly Pattern  L. Vastus medialis Normal None None None _______ Normal Normal Normal Normal  R. Vastus medialis  Normal None None None _______ Normal Normal Normal Normal  L. Tibialis anterior Normal None None None _______ Normal Normal Normal Normal  R. Tibialis anterior Normal None None None _______ Normal Normal Normal Normal  L. Gastrocnemius (Medial head) Normal None None None _______ Normal Normal Normal Normal  R. Gastrocnemius (Medial head) Normal None None None _______ Normal Normal Normal Normal  L. Peroneus longus Normal None None None _______ Normal Normal Normal Normal  R. Peroneus longus Normal None None None _______ Normal Normal Normal Normal  L. Extensor hallucis longus Normal None None None _______ Normal Normal Normal Normal  R. Extensor hallucis longus Normal None None None _______ Normal Normal Normal Normal  L. Abductor hallucis Normal None None None _______ Normal Normal Normal Normal  R. Abductor hallucis Normal None None None _______ Normal Normal Normal Normal  L. Gluteus maximus Normal None None None _______ Normal Normal Normal Normal  R. Gluteus maximus Normal None None None _______ Normal Normal Normal Normal  L. Gluteus medius Normal None None None _______ Normal Normal Normal Normal  R. Gluteus medius Normal None None None _______ Normal Normal Normal Normal

## 2021-12-11 ENCOUNTER — Telehealth: Payer: Self-pay | Admitting: Neurology

## 2021-12-11 NOTE — Telephone Encounter (Signed)
Medicare order sent to GI, [facility] they will reach out to the patient to schedule.  °

## 2021-12-13 NOTE — Progress Notes (Addendum)
Bilateral, possible remote bilateral L5/S1 radiculopathy, all other muscles were normal- and this can be seen when an injury of remote onset is  already healing. ( History per dr Jaynee Eagles:   Current symptoms started a few years ago, the toes are numb worse when laying down and its mostly the big toe and on top of the toes.   He states when he has numbness in the toes it corresponds to the low back pain and radicular symptoms.  His past back surgery was focused on L4-L5 on the left.  He states his back pain is worse on the right now.)  Patient will have MRI lumbar spine next.   [name], MD

## 2021-12-17 NOTE — Progress Notes (Signed)
Already responded to Dr Cathren Laine recommendations.

## 2021-12-21 ENCOUNTER — Other Ambulatory Visit: Payer: Self-pay

## 2021-12-21 ENCOUNTER — Ambulatory Visit
Admission: RE | Admit: 2021-12-21 | Discharge: 2021-12-21 | Disposition: A | Discharge status: Home / Self Care | Payer: Medicare Other | Source: Ambulatory Visit | Attending: Neurology | Admitting: Neurology

## 2021-12-21 DIAGNOSIS — M79604 Pain in right leg: Secondary | ICD-10-CM

## 2021-12-21 DIAGNOSIS — M5416 Radiculopathy, lumbar region: Secondary | ICD-10-CM

## 2021-12-21 DIAGNOSIS — M79605 Pain in left leg: Secondary | ICD-10-CM

## 2021-12-21 DIAGNOSIS — M5442 Lumbago with sciatica, left side: Secondary | ICD-10-CM | POA: Diagnosis not present

## 2021-12-21 DIAGNOSIS — G8929 Other chronic pain: Secondary | ICD-10-CM

## 2021-12-21 DIAGNOSIS — M5441 Lumbago with sciatica, right side: Secondary | ICD-10-CM | POA: Diagnosis not present

## 2021-12-25 ENCOUNTER — Telehealth: Payer: Self-pay

## 2021-12-25 ENCOUNTER — Telehealth: Payer: Self-pay | Admitting: Neurology

## 2021-12-25 DIAGNOSIS — M5442 Lumbago with sciatica, left side: Secondary | ICD-10-CM

## 2021-12-25 DIAGNOSIS — G8929 Other chronic pain: Secondary | ICD-10-CM

## 2021-12-25 DIAGNOSIS — M79604 Pain in right leg: Secondary | ICD-10-CM

## 2021-12-25 DIAGNOSIS — M79605 Pain in left leg: Secondary | ICD-10-CM

## 2021-12-25 DIAGNOSIS — M5416 Radiculopathy, lumbar region: Secondary | ICD-10-CM

## 2021-12-25 NOTE — Telephone Encounter (Signed)
I called patient to discuss. No answer, left a message asking him to call me back. If patient calls back another day please send call to POD 4.

## 2021-12-25 NOTE — Telephone Encounter (Signed)
-----   Message from Melvenia Beam, MD sent at 12/24/2021  5:26 PM EST ----- Pod 4: Patient's MRI of the lumbar spine does show potential for left L5 and bilateral S1 nerve root compression. This can be causing his back pain and shooting pain down his legs to his feet. I think he needs to be seen by surgery again for evaluation. We can send him back to the same physician who performed his L4/L5 surgery if he tells [place] who that is. They may recommend surgery or try injections. (CC: Dr. Brett Fairy, this was one of your patients that I did an emg/ncs on for leg pain and I ordered the MRI L Spine which confirms what I thought, bilat sciatica)

## 2021-12-25 NOTE — Telephone Encounter (Signed)
Patient returned my call. I discussed his MRI lumbar spine results. He reports that he has been to [facility] and would like the referral to be sent back to them. Order placed. Pt verbalized understanding of results. Pt had no questions at this time but was encouraged to call back if questions arise.

## 2021-12-25 NOTE — Progress Notes (Signed)
Per Dr Jaynee Eagles:  Patient's MRI of the lumbar spine does show potential for left L5 and bilateral S1 nerve root compression. This can be causing his back pain and shooting pain down his legs to his feet. I think he needs to be seen by surgery again for evaluation. We can send him back to the same physician who performed his L4/L5 surgery if he tells [place] who that is. They may recommend surgery or try injections. (CC: Dr. Brett Fairy, this was one of your patients that I did an emg/ncs on for leg pain and I ordered the MRI L Spine which confirms what I thought, bilat sciatica)

## 2021-12-25 NOTE — Telephone Encounter (Signed)
Referral sent to [facility] [phone] 

## 2022-01-09 ENCOUNTER — Other Ambulatory Visit: Payer: Self-pay | Admitting: Cardiology

## 2022-01-09 DIAGNOSIS — I2510 Atherosclerotic heart disease of native coronary artery without angina pectoris: Secondary | ICD-10-CM

## 2022-01-24 NOTE — Telephone Encounter (Signed)
Spoke to patient  01/24/2022 wanted his MRI results explained to him again. Pt also  had concerns about the long wait at [facility] .encouraged patient to call back to [place]  [facility] keep his current appointment and see if he can be placed on the wait list . Maybe he can be seen faster . Pt expressed understanding and thanked me for the call  ?

## 2022-02-15 ENCOUNTER — Encounter: Payer: Self-pay | Admitting: Cardiology

## 2022-03-08 ENCOUNTER — Telehealth: Payer: Self-pay

## 2022-03-08 NOTE — Telephone Encounter (Signed)
Pt stated he had an injection done yesterday and was told to stop his xerelto. Pt would like to know when he would be able to start taking this medication. Please advise. ?

## 2022-03-11 ENCOUNTER — Other Ambulatory Visit: Payer: Self-pay | Admitting: Cardiology

## 2022-03-11 DIAGNOSIS — I2510 Atherosclerotic heart disease of native coronary artery without angina pectoris: Secondary | ICD-10-CM

## 2022-03-11 NOTE — Telephone Encounter (Signed)
He needs to discuss when he can restart Xarelto with the provider who did the injection as they know how the procedure went and when appropriate hemostasis is achieved. ? ?Arminta Gamm Terri Skains, DO, [facility]

## 2022-03-11 NOTE — Telephone Encounter (Signed)
Called and spoke to pt, pt voiced understanding.

## 2022-03-26 ENCOUNTER — Other Ambulatory Visit: Payer: Self-pay | Admitting: Cardiology

## 2022-03-26 DIAGNOSIS — I2510 Atherosclerotic heart disease of native coronary artery without angina pectoris: Secondary | ICD-10-CM

## 2022-05-06 ENCOUNTER — Other Ambulatory Visit: Payer: Self-pay | Admitting: Cardiology

## 2022-05-06 DIAGNOSIS — I209 Angina pectoris, unspecified: Secondary | ICD-10-CM

## 2022-05-08 ENCOUNTER — Other Ambulatory Visit: Payer: Self-pay | Admitting: Cardiology

## 2022-05-24 ENCOUNTER — Encounter: Payer: Self-pay | Admitting: Cardiology

## 2022-05-24 ENCOUNTER — Ambulatory Visit: Payer: Medicare Other | Admitting: Cardiology

## 2022-05-24 VITALS — BP 109/73 | HR 62 | Temp 97.6°F | Resp 16 | Ht 66.0 in | Wt 204.0 lb

## 2022-05-24 DIAGNOSIS — E782 Mixed hyperlipidemia: Secondary | ICD-10-CM

## 2022-05-24 DIAGNOSIS — G4733 Obstructive sleep apnea (adult) (pediatric): Secondary | ICD-10-CM

## 2022-05-24 DIAGNOSIS — Z955 Presence of coronary angioplasty implant and graft: Secondary | ICD-10-CM

## 2022-05-24 DIAGNOSIS — I2510 Atherosclerotic heart disease of native coronary artery without angina pectoris: Secondary | ICD-10-CM

## 2022-05-24 DIAGNOSIS — I10 Essential (primary) hypertension: Secondary | ICD-10-CM

## 2022-05-24 MED ORDER — ASPIRIN 81 MG PO TBEC: 81.0000 mg | DELAYED_RELEASE_TABLET | Freq: Every day | ORAL | 12 refills | Status: DC

## 2022-05-24 NOTE — Progress Notes (Signed)
Date:  05/24/2022   ID:  Joseph Fisher, DOB [birth year], MRN [id number]  PCP:  Shanon Rosser, PA-C  Cardiologist:  Rex Kras, DO, [facility] (established care 01/26/2021)  Date: 05/24/22 Last Office Visit: 11/23/2021  Chief Complaint  Patient presents with   Coronary Artery Disease   Follow-up   HPI  Joseph Fisher is a 56 y.o. male whose past medical history and cardiovascular risk factors include: CAD s/p PCI,  Hypertension, OSA not on CPAP, hyperlipidemia, obesity due to excess calories.  He is referred to the office at the request of Long, Nicki Reaper, PA-C for evaluation of chest pain.  In April 2022 patient presented with symptoms of unstable angina and underwent left heart catheterization was noted to have disease in the LAD underwent angioplasty and stenting.  Since then he has been educated on the importance of improving his modifiable cardiovascular risk factors.  Since last office visit patient states that he is doing well from a cardiovascular standpoint.  He goes to the gym regularly and does his yard work to keep himself busy.  He is also started on Ozempic by PCP and he has lost approximately 27 pounds of the last 6 months due to lifestyle changes and the addition of Ozempic.  Patient stated he recently had labs with PCP.  They are not available in Care Everywhere and we will request records.  No use of sublingual nitroglycerin tablets since last office visit.  FUNCTIONAL STATUS: Walks at least 2 miles per day (5 days a week) and also some resistance training at the gym.  ALLERGIES: No Known Allergies  MEDICATION LIST PRIOR TO VISIT: Current Meds  Medication Sig   acetaminophen (TYLENOL) 325 MG tablet Take 650 mg by mouth every 6 (six) hours as needed for moderate pain.   ALPRAZolam (XANAX) 1 MG tablet Take 1 mg by mouth at bedtime as needed for anxiety.   amLODipine (NORVASC) 5 MG tablet Take 5 mg by mouth daily.   aspirin EC 81 MG tablet Take 1 tablet (81 mg total) by  mouth daily. Swallow whole.   gabapentin (NEURONTIN) 100 MG capsule Take 100 mg by mouth at bedtime.   nebivolol (BYSTOLIC) 10 MG tablet Take 1 tablet (10 mg total) by mouth every evening.   nitroGLYCERIN (NITROSTAT) 0.4 MG SL tablet PLACE 1 TAB UNDER TONGUE EVERY 5 MINS AS NEEDED FOR CHEST PAIN. GO TO ER IS MORE THAN 2 TABS NEEDED (Patient taking differently: Place 0.4 mg under the tongue every 5 (five) minutes as needed for chest pain.)   omeprazole (PRILOSEC) 40 MG capsule TAKE 1 CAPSULE BY MOUTH 2 TIMES DAILY BEFORE A MEAL. NEEDS OFFICE VISIT FOR FURTHER REFILLS (Patient taking differently: Take 40 mg by mouth in the morning and at bedtime.)   oxyCODONE-acetaminophen (PERCOCET) 10-325 MG tablet Take 1 tablet by mouth every 6 (six) hours as needed for pain.   ranolazine (RANEXA) 500 MG 12 hr tablet TAKE 1 TABLET BY MOUTH TWICE A DAY   rosuvastatin (CRESTOR) 40 MG tablet TAKE 1 TABLET BY MOUTH EVERYDAY AT BEDTIME   Semaglutide,0.25 or 0.5MG/DOS, (OZEMPIC, 0.25 OR 0.5 MG/DOSE,) 2 MG/3ML SOPN Inject 0.5 mg into the skin once a week.   valsartan-hydrochlorothiazide (DIOVAN-HCT) 320-12.5 MG tablet Take 1 tablet by mouth daily.   VASCEPA 1 g capsule TAKE 2 CAPSULES BY MOUTH TWICE A DAY   [DISCONTINUED] clopidogrel (PLAVIX) 75 MG tablet TAKE 8 TABLETS (600MG) ONLY ON FRIDAY (02/02/21) THEN START TAKING 1 TABLET DAILY 02/03/21 (Patient taking  differently: Take 75 mg by mouth daily.)     PAST MEDICAL HISTORY: Past Medical History:  Diagnosis Date   Anemia, unspecified 07/14/2014   Barrett's esophagus    Blood transfusion without reported diagnosis 05/2014   Cervical stenosis of spine    Chronic back pain    Coronary artery disease    Diverticulosis    External hemorrhoids    Gastric ulcer    GERD (gastroesophageal reflux disease)    GI bleed    Hemorrhagic shock (HCC)    Hyperlipidemia    Hypertension    Hypoxemia 06/20/2014   Insomnia    OSA (obstructive sleep apnea) 01/06/2019   Peptic  ulcer    Pneumonia    RLS (restless legs syndrome) 06/20/2014   Snoring 06/20/2014   Unspecified deficiency anemia 07/14/2014    PAST SURGICAL HISTORY: Past Surgical History:  Procedure Laterality Date   ANTERIOR CERVICAL DECOMP/DISCECTOMY FUSION N/A 10/03/2017   Procedure: ACDF - C4-C5 - C5-C6 - C6-C7;  Surgeon: Earnie Larsson, MD;  Location: [place];  Service: Neurosurgery;  Laterality: N/A;   BACK SURGERY  10/29/2011   CARDIAC CATHETERIZATION     CORONARY STENT INTERVENTION N/A 01/30/2021   Procedure: CORONARY STENT INTERVENTION;  Surgeon: Nigel Mormon, MD;  Location: [place] [facility];  Service: Cardiovascular;  Laterality: N/A;   ESOPHAGOGASTRODUODENOSCOPY N/A 06/04/2014   Procedure: ESOPHAGOGASTRODUODENOSCOPY (EGD);  Surgeon: Jerene Bears, MD;  Location: [facility] ENDOSCOPY;  Service: Endoscopy;  Laterality: N/A;   INTRAVASCULAR IMAGING/OCT N/A 01/30/2021   Procedure: INTRAVASCULAR IMAGING/OCT;  Surgeon: Nigel Mormon, MD;  Location: [place] [facility];  Service: Cardiovascular;  Laterality: N/A;   LEFT HEART CATH AND CORONARY ANGIOGRAPHY N/A 01/30/2021   Procedure: LEFT HEART CATH AND CORONARY ANGIOGRAPHY;  Surgeon: Nigel Mormon, MD;  Location: [place] [facility];  Service: Cardiovascular;  Laterality: N/A;    FAMILY HISTORY: The patient family history includes Hypertension in his father and mother.  SOCIAL HISTORY:  The patient  reports that he has never smoked. He has never used smokeless tobacco. He reports that he does not currently use alcohol. He reports that he does not use drugs.  REVIEW OF SYSTEMS: Review of Systems  Cardiovascular:  Negative for chest pain, claudication, dyspnea on exertion, irregular heartbeat, leg swelling, near-syncope, orthopnea, palpitations, paroxysmal nocturnal dyspnea and syncope.  Respiratory:  Negative for shortness of breath.   Hematologic/Lymphatic: Negative for bleeding problem.  Musculoskeletal:  Negative for muscle cramps and  myalgias.  Neurological:  Negative for dizziness and light-headedness.    PHYSICAL EXAM:    05/24/2022   10:35 AM 11/23/2021   10:20 AM 10/09/2021    1:14 PM  Vitals with BMI  Height _0  _1  _2   Weight 204 lbs 231 lbs 231 lbs 8 oz  BMI 32.94 28.4 13.24  Systolic 401 027 253  Diastolic 73 71 80  Pulse 62 55 63   Physical Exam  Constitutional: He appears healthy. No distress.  Neck: No JVD present.  Cardiovascular: Normal rate, regular rhythm, S1 normal, S2 normal, intact distal pulses and normal pulses. Exam reveals no gallop, no S3 and no S4.  No murmur heard. Pulmonary/Chest: Effort normal and breath sounds normal. No stridor. He has no wheezes. He has no rales.  Abdominal: Soft. Bowel sounds are normal. He exhibits no distension. There is no abdominal tenderness.  Musculoskeletal:        General: No edema.  Neurological: He is alert and oriented to person,  place, and time.   CARDIAC DATABASE: EKG: 05/24/2022: NSR, 63 bpm, without underlying ischemia injury pattern.   Echocardiogram: 03/06/2021:  Left ventricle cavity is normal in size and wall thickness. Normal global wall motion. Normal LV systolic function with EF 67%. Normal diastolic filling pattern.  No significant valvular abnormality.  Normal right atrial pressure.    Stress Testing: No results found for this or any previous visit from the past 1095 days.   Heart Catheterization: 01/30/2021: LM: Normal LAD: Proximal 90% stenosis         Diagonal 1 proximal 60% stenosis         Distal apical 90% stenosis in a small caliber vessel LCx: Normal RCA: Distal 20%, RPDA 20% stenosis  LABORATORY DATA:    Latest Ref Rng & Units 01/30/2021   10:09 AM 09/29/2017    2:02 PM 10/31/2014   10:15 AM  CBC  WBC 4.0 - 10.5 K/uL 4.7  3.9  3.4   Hemoglobin 13.0 - 17.0 g/dL 15.2  15.0  14.4   Hematocrit 39.0 - 52.0 % 44.5  44.0  42.5   Platelets 150 - 400 K/uL 189  152  128        Latest Ref Rng & Units 12/03/2021    11:08 AM 10/09/2021    2:40 PM 10/04/2021   11:34 AM  CMP  Glucose 70 - 99 mg/dL 116   148   BUN 6 - 24 mg/dL 11   12   Creatinine 0.76 - 1.27 mg/dL 1.07   0.93   Sodium 134 - 144 mmol/L 145   143   Potassium 3.5 - 5.2 mmol/L 4.3   4.3   Chloride 96 - 106 mmol/L 105   103   CO2 20 - 29 mmol/L 23   24   Calcium 8.7 - 10.2 mg/dL 9.7   9.2   Total Protein 6.0 - 8.5 g/dL 6.8  7.3  6.8   Total Bilirubin 0.0 - 1.2 mg/dL 0.5   0.4   Alkaline Phos 44 - 121 IU/L 50   50   AST 0 - 40 IU/L 17   14   ALT 0 - 44 IU/L 22   20     Lipid Panel     Component Value Date/Time   CHOL 103 12/03/2021 1108   TRIG 155 (H) 12/03/2021 1108   HDL 45 12/03/2021 1108   LDLCALC 32 12/03/2021 1108   LDLDIRECT 32 12/03/2021 1108   LABVLDL 26 12/03/2021 1108    No components found for: "NTPROBNP"  No results for input(s): "PROBNP" in the last 8760 hours.  No results for input(s): "TSH" in the last 8760 hours.  BMP Recent Labs    10/04/21 1134 12/03/21 1108  NA 143 145*  K 4.3 4.3  CL 103 105  CO2 24 23  GLUCOSE 148* 116*  BUN 12 11  CREATININE 0.93 1.07  CALCIUM 9.2 9.7    HEMOGLOBIN A1C No results found for: "HGBA1C", "MPG"  External Labs: Collected: 01/13/2021, provided by PCP Hemoglobin 14.1 g/dL, hematocrit 41.1% Platelets 147 Creatinine 0.84 mg/dL. eGFR: 99 mL/min per 1.73 m Potassium 3.8 AST 21, ALT 27, alkaline phosphatase 53 Hemoglobin A1c: 6.1  External Labs: Collected: 07/09/2021 provided by the patient. Hemoglobin A1c 5.9. Hemoglobin 14.5 g/dL, hematocrit 42.8%. Platelets 150.  07/05/2021 total cholesterol 137, HDL 41, triglycerides 312, LDL 63, non-HDL 96   IMPRESSION:    ICD-10-CM   1. Atherosclerosis of native coronary artery of native heart without angina  pectoris  I25.10 EKG 12-Lead    aspirin EC 81 MG tablet    2. History of coronary angioplasty with insertion of stent  Z95.5 aspirin EC 81 MG tablet    3. Mixed hyperlipidemia  E78.2     4. Benign essential  hypertension  I10     5. OSA (obstructive sleep apnea)  G47.33     6. Class 2 severe obesity due to excess calories with serious comorbidity and body mass index (BMI) of 37.0 to 37.9 in adult [facility])  E66.01    Z68.37        RECOMMENDATIONS: Joseph Fisher is a 56 y.o. male whose past medical history and cardiac risk factors include:  CAD s/p PCI,  Hypertension, OSA not on CPAP, hyperlipidemia, obesity due to excess calories.  Atherosclerosis of native coronary artery of native heart without angina pectoris; hx angioplasty & stent Free of angina pectoris. Has completed dual antiplatelet therapy for 1 year. Status post PCI to the LAD in April 2022. EKG: Nonischemic. Recent echo and heart catheterization results reviewed as part of today's office visit. Patient has been on dual antiplatelet therapy for 1 year, and easily bruises.  Recommended discontinuation of Plavix 75 mg p.o. daily and to continue aspirin 81 mg p.o. daily. Patient has lost approximately 27 pounds due to lifestyle changes, increasing physical activity, and initiation of Ozempic. Educated on importance of secondary prevention with focus on glycemic control, blood pressure, triglyceride, & lipid management. Recommended that he follows up with PCP and consider sleep study to evaluate for sleep apnea.  Mixed hyperlipidemia Currently on rosuvastatin, Vascepa. No recent lipid profile for review.  We will obtain records from PCP. Patient does not endorse myalgias.  Benign essential hypertension Office blood pressures are within acceptable limits. Medications reconciled. No changes warranted at this time.  OSA (obstructive sleep apnea) Noncompliant prior CPAP use. May need to be reevaluated-we will defer to primary team.  Class 2 severe obesity due to excess calories with serious comorbidity and body mass index (BMI) of 37.0 to 37.9 in adult [facility]) Body mass index is 32.93 kg/m. I reviewed with the patient the  importance of diet, regular physical activity/exercise, weight loss.   Patient is educated on increasing physical activity gradually as tolerated.  With the goal of moderate intensity exercise for 30 minutes a day 5 days a week.  FINAL MEDICATION LIST END OF ENCOUNTER: Meds ordered this encounter  Medications   aspirin EC 81 MG tablet    Sig: Take 1 tablet (81 mg total) by mouth daily. Swallow whole.    Dispense:  30 tablet    Refill:  12    Medications Discontinued During This Encounter  Medication Reason   clopidogrel (PLAVIX) 75 MG tablet Change in therapy     Current Outpatient Medications:    acetaminophen (TYLENOL) 325 MG tablet, Take 650 mg by mouth every 6 (six) hours as needed for moderate pain., Disp: , Rfl:    ALPRAZolam (XANAX) 1 MG tablet, Take 1 mg by mouth at bedtime as needed for anxiety., Disp: , Rfl:    amLODipine (NORVASC) 5 MG tablet, Take 5 mg by mouth daily., Disp: , Rfl:    aspirin EC 81 MG tablet, Take 1 tablet (81 mg total) by mouth daily. Swallow whole., Disp: 30 tablet, Rfl: 12   gabapentin (NEURONTIN) 100 MG capsule, Take 100 mg by mouth at bedtime., Disp: , Rfl:    nebivolol (BYSTOLIC) 10 MG tablet, Take 1 tablet (10 mg  total) by mouth every evening., Disp: 30 tablet, Rfl: 0   nitroGLYCERIN (NITROSTAT) 0.4 MG SL tablet, PLACE 1 TAB UNDER TONGUE EVERY 5 MINS AS NEEDED FOR CHEST PAIN. GO TO ER IS MORE THAN 2 TABS NEEDED (Patient taking differently: Place 0.4 mg under the tongue every 5 (five) minutes as needed for chest pain.), Disp: 25 tablet, Rfl: 2   omeprazole (PRILOSEC) 40 MG capsule, TAKE 1 CAPSULE BY MOUTH 2 TIMES DAILY BEFORE A MEAL. NEEDS OFFICE VISIT FOR FURTHER REFILLS (Patient taking differently: Take 40 mg by mouth in the morning and at bedtime.), Disp: 180 capsule, Rfl: 0   oxyCODONE-acetaminophen (PERCOCET) 10-325 MG tablet, Take 1 tablet by mouth every 6 (six) hours as needed for pain., Disp: , Rfl: 0   ranolazine (RANEXA) 500 MG 12 hr tablet, TAKE  1 TABLET BY MOUTH TWICE A DAY, Disp: 180 tablet, Rfl: 1   rosuvastatin (CRESTOR) 40 MG tablet, TAKE 1 TABLET BY MOUTH EVERYDAY AT BEDTIME, Disp: 90 tablet, Rfl: 0   Semaglutide,0.25 or 0.5MG/DOS, (OZEMPIC, 0.25 OR 0.5 MG/DOSE,) 2 MG/3ML SOPN, Inject 0.5 mg into the skin once a week., Disp: , Rfl:    valsartan-hydrochlorothiazide (DIOVAN-HCT) 320-12.5 MG tablet, Take 1 tablet by mouth daily., Disp: , Rfl:    VASCEPA 1 g capsule, TAKE 2 CAPSULES BY MOUTH TWICE A DAY, Disp: 120 capsule, Rfl: 2  Orders Placed This Encounter  Procedures   EKG 12-Lead    There are no Patient Instructions on file for this visit.   --Continue cardiac medications as reconciled in final medication list. --Return in about 1 year (around 05/25/2023) for Follow up, CAD. Or sooner if needed. --Continue follow-up with your primary care physician regarding the management of your other chronic comorbid conditions.  Patient's questions and concerns were addressed to his satisfaction. He voices understanding of the instructions provided during this encounter.   This note was created using a voice recognition software as a result there may be grammatical errors inadvertently enclosed that do not reflect the nature of this encounter. Every attempt is made to correct such errors.  Rex Kras, [place], [facility]  Pager: [phone] Office: [phone]

## 2022-05-28 ENCOUNTER — Other Ambulatory Visit: Payer: Self-pay | Admitting: Cardiology

## 2022-05-31 ENCOUNTER — Other Ambulatory Visit: Payer: Self-pay | Admitting: Cardiology

## 2022-06-05 ENCOUNTER — Other Ambulatory Visit: Payer: Self-pay | Admitting: Cardiology

## 2022-06-28 ENCOUNTER — Other Ambulatory Visit: Payer: Self-pay | Admitting: Cardiology

## 2022-06-28 DIAGNOSIS — I2510 Atherosclerotic heart disease of native coronary artery without angina pectoris: Secondary | ICD-10-CM

## 2022-07-24 ENCOUNTER — Telehealth: Payer: Self-pay | Admitting: Hematology and Oncology

## 2022-07-24 NOTE — Telephone Encounter (Signed)
Scheduled appt per 9/27 referral. Pt is aware of appt date and time. Pt is aware to arrive 15 mins prior to appt time and to bring and updated insurance card. Pt is aware of appt location.   

## 2022-07-25 ENCOUNTER — Telehealth: Payer: Self-pay | Admitting: Hematology and Oncology

## 2022-07-25 NOTE — Telephone Encounter (Signed)
R/s pt's new hem appt per pt request. Pt is aware of new appt date/time. He requested an earlier date.

## 2022-08-12 ENCOUNTER — Encounter: Payer: Self-pay | Admitting: Hematology and Oncology

## 2022-08-12 DIAGNOSIS — R233 Spontaneous ecchymoses: Secondary | ICD-10-CM | POA: Insufficient documentation

## 2022-08-12 NOTE — Assessment & Plan Note (Signed)
The most likely cause of easy bruising is related to his antiplatelet agent and Vascepa It is not possible to order work-up on his platelet pathway in the presence of these agents The patient has discontinued antiplatelet agent 2 weeks ago I recommend discontinuation of Vascepa for 10 days He will return here next week for blood work I will see him back to review test results He can resume taking antiplatelet agents and Vascepa after his blood work Due to extensive prior surgery, I do not believe he has inheritable blood clotting disorder

## 2022-08-12 NOTE — Progress Notes (Unsigned)
[facility] FOLLOW-UP progress notes  Patient Care Team: Shanon Rosser, PA-C as PCP - General (Physician Assistant) Everardo Beals, NP as Referring Physician  ASSESSMENT & PLAN:  Easy bruising The most likely cause of easy bruising is related to his antiplatelet agent and Vascepa It is not possible to order work-up on his platelet pathway in the presence of these agents The patient has discontinued antiplatelet agent 2 weeks ago I recommend discontinuation of Vascepa for 10 days He will return here next week for blood work I will see him back to review test results He can resume taking antiplatelet agents and Vascepa after his blood work Due to extensive prior surgery, I do not believe he has inheritable blood clotting disorder  Idiopathic peripheral neuropathy I suspect the cause of his peripheral neuropathy is multifactorial He had extensive evaluation by neurologist this year He has lost a lot of weight with medication He is no longer taking medications for diabetes I recommend exercise as tolerated I anticipate improvement in the future with improved posture and continuous weight loss I will recheck vitamin B12 level when he returns He is taking gabapentin and amitriptyline now for symptom management  Orders Placed This Encounter  Procedures   CBC with Differential/Platelet    Standing Status:   Future    Standing Expiration Date:   08/14/2023   Vitamin B12    Standing Status:   Future    Standing Expiration Date:   08/14/2023   CMP ([place] only)    Standing Status:   Future    Standing Expiration Date:   08/14/2023   Protime-INR    Standing Status:   Future    Standing Expiration Date:   08/14/2023   APTT    Standing Status:   Future    Standing Expiration Date:   08/14/2023   Platelet aggregation study, blood    Standing Status:   Future    Standing Expiration Date:   08/14/2023   Fibrinogen    Standing Status:   Future    Standing  Expiration Date:   08/14/2023   Von Willebrand panel    Standing Status:   Future    Standing Expiration Date:   08/14/2023     All questions were answered. The patient knows to call the clinic with any problems, questions or concerns. The total time spent in the appointment was 55 minutes encounter with patients including review of chart and various tests results, discussions about plan of care and coordination of care plan   [name], MD 08/12/2022 10:27 AM  CHIEF COMPLAINTS/PURPOSE OF VISIT:  Easy bruising, history of GI bleed secondary to aspirin therapy  HISTORY OF PRESENTING ILLNESS:  Joseph Fisher 56 y.o. male was last seen in 2016 He was originally seen in 2015 after presentation with leukopenia and GI bleed secondary to aspirin therapy He is doing well all these years He had coronary artery disease status post stent placement and was placed on antiplatelet agents and Vascepa Due to excessive bruising, he has discontinue aspirin 2 weeks ago He denies spontaneous bleeding He has peripheral neuropathy that bothers him from time to time He has lost 30 pounds of weight since he was started on semaglutide He denies over-the-counter supplements or NSAID  MEDICAL HISTORY:  Past Medical History:  Diagnosis Date   Anemia, unspecified 07/14/2014   Barrett's esophagus    Blood transfusion without reported diagnosis 05/2014   Cervical stenosis of spine    Chronic back  pain    Coronary artery disease    Diverticulosis    External hemorrhoids    Gastric ulcer    GERD (gastroesophageal reflux disease)    GI bleed    Hemorrhagic shock (HCC)    Hyperlipidemia    Hypertension    Hypoxemia 06/20/2014   Insomnia    OSA (obstructive sleep apnea) 01/06/2019   Peptic ulcer    Pneumonia    RLS (restless legs syndrome) 06/20/2014   Snoring 06/20/2014   Unspecified deficiency anemia 07/14/2014    SURGICAL HISTORY: Past Surgical History:  Procedure Laterality Date   ANTERIOR  CERVICAL DECOMP/DISCECTOMY FUSION N/A 10/03/2017   Procedure: ACDF - C4-C5 - C5-C6 - C6-C7;  Surgeon: Earnie Larsson, MD;  Location: [place];  Service: Neurosurgery;  Laterality: N/A;   BACK SURGERY  10/29/2011   CARDIAC CATHETERIZATION     CORONARY STENT INTERVENTION N/A 01/30/2021   Procedure: CORONARY STENT INTERVENTION;  Surgeon: Nigel Mormon, MD;  Location: [facility];  Service: Cardiovascular;  Laterality: N/A;   ESOPHAGOGASTRODUODENOSCOPY N/A 06/04/2014   Procedure: ESOPHAGOGASTRODUODENOSCOPY (EGD);  Surgeon: Jerene Bears, MD;  Location: [facility] ENDOSCOPY;  Service: Endoscopy;  Laterality: N/A;   INTRAVASCULAR IMAGING/OCT N/A 01/30/2021   Procedure: INTRAVASCULAR IMAGING/OCT;  Surgeon: Nigel Mormon, MD;  Location: [facility];  Service: Cardiovascular;  Laterality: N/A;   LEFT HEART CATH AND CORONARY ANGIOGRAPHY N/A 01/30/2021   Procedure: LEFT HEART CATH AND CORONARY ANGIOGRAPHY;  Surgeon: Nigel Mormon, MD;  Location: [facility];  Service: Cardiovascular;  Laterality: N/A;    SOCIAL HISTORY: Social History   Socioeconomic History   Marital status: Divorced    Spouse name: Not on file   Number of children: 0   Years of education: 12   Highest education level: Not on file  Occupational History   Occupation: disabled  Tobacco Use   Smoking status: Never   Smokeless tobacco: Never  Vaping Use   Vaping Use: Never used  Substance and Sexual Activity   Alcohol use: Not Currently    Alcohol/week: 0.0 standard drinks of alcohol   Drug use: No   Sexual activity: Yes    Birth control/protection: None  Other Topics Concern   Not on file  Social History Narrative   Not on file   Social Determinants of Health   Financial Resource Strain: Not on file  Food Insecurity: Not on file  Transportation Needs: Not on file  Physical Activity: Not on file  Stress: Not on file  Social Connections: Not on file  Intimate Partner Violence: Not on file     FAMILY HISTORY: Family History  Problem Relation Age of Onset   Hypertension Mother    Hypertension Father     ALLERGIES:  has No Known Allergies.  MEDICATIONS:  Current Outpatient Medications  Medication Sig Dispense Refill   amitriptyline (ELAVIL) 50 MG tablet Take 50-100 mg by mouth daily as needed.     gabapentin (NEURONTIN) 300 MG capsule Take 600 mg by mouth 3 (three) times daily.     acetaminophen (TYLENOL) 325 MG tablet Take 650 mg by mouth every 6 (six) hours as needed for moderate pain.     ALPRAZolam (XANAX) 1 MG tablet Take 1 mg by mouth at bedtime as needed for anxiety.     amLODipine (NORVASC) 5 MG tablet Take 5 mg by mouth daily.     nebivolol (BYSTOLIC) 10 MG tablet Take 1 tablet (10 mg total) by mouth every  evening. 30 tablet 0   nitroGLYCERIN (NITROSTAT) 0.4 MG SL tablet PLACE 1 TAB UNDER TONGUE EVERY 5 MINS AS NEEDED FOR CHEST PAIN. GO TO ER IS MORE THAN 2 TABS NEEDED (Patient taking differently: Place 0.4 mg under the tongue every 5 (five) minutes as needed for chest pain.) 25 tablet 2   oxyCODONE-acetaminophen (PERCOCET) 10-325 MG tablet Take 1 tablet by mouth every 6 (six) hours as needed for pain.  0   ranolazine (RANEXA) 500 MG 12 hr tablet TAKE 1 TABLET BY MOUTH TWICE A DAY 180 tablet 1   rosuvastatin (CRESTOR) 40 MG tablet TAKE 1 TABLET BY MOUTH EVERYDAY AT BEDTIME 90 tablet 0   Semaglutide,0.25 or 0.'5MG'$ /DOS, (OZEMPIC, 0.25 OR 0.5 MG/DOSE,) 2 MG/3ML SOPN Inject 0.5 mg into the skin once a week.     valsartan-hydrochlorothiazide (DIOVAN-HCT) 320-12.5 MG tablet Take 1 tablet by mouth daily.     VASCEPA 1 g capsule TAKE 2 CAPSULES BY MOUTH TWICE A DAY 120 capsule 2   No current facility-administered medications for this visit.    REVIEW OF SYSTEMS:   Constitutional: Denies fevers, chills or abnormal night sweats Eyes: Denies blurriness of vision, double vision or watery eyes Ears, nose, mouth, throat, and face: Denies mucositis or sore throat Respiratory:  Denies cough, dyspnea or wheezes Cardiovascular: Denies palpitation, chest discomfort or lower extremity swelling Gastrointestinal:  Denies nausea, heartburn or change in bowel habits Skin: Denies abnormal skin rashes Lymphatics: Denies new lymphadenopathy Neurological:Denies numbness, tingling or new weaknesses Behavioral/Psych: Mood is stable, no new changes  All other systems were reviewed with the patient and are negative.  PHYSICAL EXAMINATION: ECOG PERFORMANCE STATUS: 1 - Symptomatic but completely ambulatory  Vitals:   08/13/22 1308  BP: 124/78  Pulse: 64  Resp: 18  Temp: 98.4 F (36.9 C)  SpO2: 99%   Filed Weights   08/13/22 1308  Weight: 205 lb (93 kg)    GENERAL:alert, no distress and comfortable SKIN: skin color, texture, turgor are normal, no rashes or significant lesions EYES: normal, conjunctiva are pink and non-injected, sclera clear OROPHARYNX:no exudate, normal lips, buccal mucosa, and tongue  NECK: supple, thyroid normal size, non-tender, without nodularity LYMPH:  no palpable lymphadenopathy in the cervical, axillary or inguinal LUNGS: clear to auscultation and percussion with normal breathing effort HEART: regular rate & rhythm and no murmurs without lower extremity edema ABDOMEN:abdomen soft, non-tender and normal bowel sounds Musculoskeletal:no cyanosis of digits and no clubbing  PSYCH: alert & oriented x 3 with fluent speech NEURO: no focal motor/sensory deficits  LABORATORY DATA:  I have reviewed the data as listed Lab Results  Component Value Date   WBC 4.7 01/30/2021   HGB 15.2 01/30/2021   HCT 44.5 01/30/2021   MCV 88.5 01/30/2021   PLT 189 01/30/2021   Recent Labs    10/04/21 1134 10/09/21 1440 12/03/21 1108  NA 143  --  145*  K 4.3  --  4.3  CL 103  --  105  CO2 24  --  23  GLUCOSE 148*  --  116*  BUN 12  --  11  CREATININE 0.93  --  1.07  CALCIUM 9.2  --  9.7  PROT 6.8 7.3 6.8  ALBUMIN 4.9  --  4.9  AST 14  --  17  ALT 20   --  22  ALKPHOS 50  --  50  BILITOT 0.4  --  0.5

## 2022-08-13 ENCOUNTER — Other Ambulatory Visit: Payer: Self-pay

## 2022-08-13 ENCOUNTER — Inpatient Hospital Stay: Payer: Medicare Other | Attending: Hematology and Oncology | Admitting: Hematology and Oncology

## 2022-08-13 ENCOUNTER — Encounter: Payer: Self-pay | Admitting: Hematology and Oncology

## 2022-08-13 VITALS — BP 124/78 | HR 64 | Temp 98.4°F | Resp 18 | Ht 66.0 in | Wt 205.0 lb

## 2022-08-13 DIAGNOSIS — G4700 Insomnia, unspecified: Secondary | ICD-10-CM | POA: Diagnosis not present

## 2022-08-13 DIAGNOSIS — K219 Gastro-esophageal reflux disease without esophagitis: Secondary | ICD-10-CM | POA: Insufficient documentation

## 2022-08-13 DIAGNOSIS — Z79899 Other long term (current) drug therapy: Secondary | ICD-10-CM | POA: Insufficient documentation

## 2022-08-13 DIAGNOSIS — G609 Hereditary and idiopathic neuropathy, unspecified: Secondary | ICD-10-CM | POA: Diagnosis not present

## 2022-08-13 DIAGNOSIS — G2581 Restless legs syndrome: Secondary | ICD-10-CM | POA: Insufficient documentation

## 2022-08-13 DIAGNOSIS — E785 Hyperlipidemia, unspecified: Secondary | ICD-10-CM | POA: Diagnosis not present

## 2022-08-13 DIAGNOSIS — K2270 Barrett's esophagus without dysplasia: Secondary | ICD-10-CM | POA: Diagnosis not present

## 2022-08-13 DIAGNOSIS — G629 Polyneuropathy, unspecified: Secondary | ICD-10-CM | POA: Insufficient documentation

## 2022-08-13 DIAGNOSIS — R233 Spontaneous ecchymoses: Secondary | ICD-10-CM | POA: Diagnosis not present

## 2022-08-13 DIAGNOSIS — Z8711 Personal history of peptic ulcer disease: Secondary | ICD-10-CM | POA: Diagnosis not present

## 2022-08-13 DIAGNOSIS — I2510 Atherosclerotic heart disease of native coronary artery without angina pectoris: Secondary | ICD-10-CM | POA: Insufficient documentation

## 2022-08-13 DIAGNOSIS — I10 Essential (primary) hypertension: Secondary | ICD-10-CM | POA: Diagnosis not present

## 2022-08-13 DIAGNOSIS — D509 Iron deficiency anemia, unspecified: Secondary | ICD-10-CM | POA: Insufficient documentation

## 2022-08-13 DIAGNOSIS — G4730 Sleep apnea, unspecified: Secondary | ICD-10-CM | POA: Insufficient documentation

## 2022-08-13 NOTE — Assessment & Plan Note (Signed)
I suspect the cause of his peripheral neuropathy is multifactorial He had extensive evaluation by neurologist this year He has lost a lot of weight with medication He is no longer taking medications for diabetes I recommend exercise as tolerated I anticipate improvement in the future with improved posture and continuous weight loss I will recheck vitamin B12 level when he returns He is taking gabapentin and amitriptyline now for symptom management

## 2022-08-19 ENCOUNTER — Inpatient Hospital Stay: Payer: Medicare Other | Admitting: Hematology and Oncology

## 2022-08-23 ENCOUNTER — Other Ambulatory Visit: Payer: Self-pay

## 2022-08-23 ENCOUNTER — Inpatient Hospital Stay: Payer: Medicare Other

## 2022-08-23 DIAGNOSIS — R233 Spontaneous ecchymoses: Secondary | ICD-10-CM

## 2022-08-23 DIAGNOSIS — G629 Polyneuropathy, unspecified: Secondary | ICD-10-CM | POA: Diagnosis not present

## 2022-08-23 LAB — CBC WITH DIFFERENTIAL/PLATELET
Abs Immature Granulocytes: 0.01 10*3/uL (ref 0.00–0.07)
Basophils Absolute: 0 10*3/uL (ref 0.0–0.1)
Basophils Relative: 1 %
Eosinophils Absolute: 0.1 10*3/uL (ref 0.0–0.5)
Eosinophils Relative: 1 %
HCT: 40.7 % (ref 39.0–52.0)
Hemoglobin: 14.2 g/dL (ref 13.0–17.0)
Immature Granulocytes: 0 %
Lymphocytes Relative: 47 %
Lymphs Abs: 1.8 10*3/uL (ref 0.7–4.0)
MCH: 32 pg (ref 26.0–34.0)
MCHC: 34.9 g/dL (ref 30.0–36.0)
MCV: 91.7 fL (ref 80.0–100.0)
Monocytes Absolute: 0.2 10*3/uL (ref 0.1–1.0)
Monocytes Relative: 6 %
Neutro Abs: 1.7 10*3/uL (ref 1.7–7.7)
Neutrophils Relative %: 45 %
Platelets: 158 10*3/uL (ref 150–400)
RBC: 4.44 MIL/uL (ref 4.22–5.81)
RDW: 11.9 % (ref 11.5–15.5)
WBC: 3.8 10*3/uL — ABNORMAL LOW (ref 4.0–10.5)
nRBC: 0 % (ref 0.0–0.2)

## 2022-08-23 LAB — CMP (CANCER CENTER ONLY)
ALT: 15 U/L (ref 0–44)
AST: 13 U/L — ABNORMAL LOW (ref 15–41)
Albumin: 4.8 g/dL (ref 3.5–5.0)
Alkaline Phosphatase: 36 U/L — ABNORMAL LOW (ref 38–126)
Anion gap: 7 (ref 5–15)
BUN: 16 mg/dL (ref 6–20)
CO2: 29 mmol/L (ref 22–32)
Calcium: 9.5 mg/dL (ref 8.9–10.3)
Chloride: 104 mmol/L (ref 98–111)
Creatinine: 1.26 mg/dL — ABNORMAL HIGH (ref 0.61–1.24)
GFR, Estimated: >60 mL/min (ref >60)
Glucose, Bld: 100 mg/dL — ABNORMAL HIGH (ref 70–99)
Potassium: 3.8 mmol/L (ref 3.5–5.1)
Sodium: 140 mmol/L (ref 135–145)
Total Bilirubin: 0.5 mg/dL (ref 0.3–1.2)
Total Protein: 7.6 g/dL (ref 6.5–8.1)

## 2022-08-23 LAB — PROTIME-INR
INR: 1 (ref 0.8–1.2)
Prothrombin Time: 13.4 seconds (ref 11.4–15.2)

## 2022-08-23 LAB — VITAMIN B12: Vitamin B-12: 158 pg/mL — ABNORMAL LOW (ref 180–914)

## 2022-08-23 LAB — FIBRINOGEN: Fibrinogen: 345 mg/dL (ref 210–475)

## 2022-08-23 LAB — APTT: aPTT: 28 seconds (ref 24–36)

## 2022-08-24 LAB — VON WILLEBRAND PANEL
Coagulation Factor VIII: 117 % (ref 56–140)
Ristocetin Co-factor, Plasma: 64 % (ref 50–200)
Von Willebrand Antigen, Plasma: 91 % (ref 50–200)

## 2022-08-24 LAB — COAG STUDIES INTERP REPORT

## 2022-08-26 ENCOUNTER — Encounter: Payer: Self-pay | Admitting: Hematology and Oncology

## 2022-08-27 LAB — PLATELET AGGREGATION STUDY, BLOOD

## 2022-09-02 ENCOUNTER — Ambulatory Visit: Payer: Medicare Other | Admitting: Hematology and Oncology

## 2022-09-09 ENCOUNTER — Telehealth: Payer: Self-pay

## 2022-09-09 NOTE — Telephone Encounter (Signed)
Called and given below message. He verbalized understanding. 

## 2022-09-09 NOTE — Telephone Encounter (Signed)
-----   Message from [name], MD sent at 09/09/2022  9:56 AM EST ----- For appt tomorrow can you ask him to bring all pill bottles including tylenol, ibuprofen or other supplementS?

## 2022-09-10 ENCOUNTER — Inpatient Hospital Stay: Payer: Medicare Other | Attending: Hematology and Oncology | Admitting: Hematology and Oncology

## 2022-09-10 ENCOUNTER — Other Ambulatory Visit: Payer: Self-pay

## 2022-09-10 ENCOUNTER — Encounter: Payer: Self-pay | Admitting: Hematology and Oncology

## 2022-09-10 ENCOUNTER — Other Ambulatory Visit: Payer: Self-pay | Admitting: Cardiology

## 2022-09-10 VITALS — BP 104/74 | HR 74 | Temp 98.1°F | Resp 18 | Ht 66.0 in | Wt 198.8 lb

## 2022-09-10 DIAGNOSIS — Z79899 Other long term (current) drug therapy: Secondary | ICD-10-CM | POA: Diagnosis not present

## 2022-09-10 DIAGNOSIS — I2510 Atherosclerotic heart disease of native coronary artery without angina pectoris: Secondary | ICD-10-CM

## 2022-09-10 DIAGNOSIS — G629 Polyneuropathy, unspecified: Secondary | ICD-10-CM | POA: Diagnosis not present

## 2022-09-10 DIAGNOSIS — R233 Spontaneous ecchymoses: Secondary | ICD-10-CM | POA: Diagnosis not present

## 2022-09-10 DIAGNOSIS — E538 Deficiency of other specified B group vitamins: Secondary | ICD-10-CM | POA: Insufficient documentation

## 2022-09-10 NOTE — Progress Notes (Signed)
Joseph Fisher  Long, Scott, PA-C  ASSESSMENT & PLAN:  Easy bruising This is related to Vascepa as well as intermittent aspirin intake I have ran extensive tests and excluded diagnosis of hemophilia, liver disease and others His platelet aggregation study is consistent with recent aspirin intake Since he held Vascepa prior to blood work, his bruising disappeared I told the patient this is not a contraindication for him to continue on Vascepa, especially if it helps prevent heart disease He can resume aspirin  Vitamin B12 deficiency He has vitamin B12 deficiency and this is the most likely cause of his leukopenia I recommend high-dose oral vitamin B12 supplement and I plan to recheck in 3 months If he has no improvement despite oral supplement, I will institute B12 injection  Orders Placed This Encounter  Procedures   CBC with Differential ([place] Only)    Standing Status:   Future    Standing Expiration Date:   09/11/2023   Vitamin B12    Standing Status:   Future    Standing Expiration Date:   09/11/2023    The total time spent in the appointment was 20 minutes encounter with patients including review of chart and various tests results, discussions about plan of care and coordination of care plan   All questions were answered. The patient knows to call the clinic with any problems, questions or concerns. No barriers to learning was detected.    [name], MD 11/14/202312:42 PM  INTERVAL HISTORY: Joseph Fisher 56 y.o. male returns for review of test results He was seen and evaluated for bruising Since he stopped taking Vascepa, his bruising disappeared He brought all his pill bottles He has not taken Plavix since 3 to 4 months ago but has been taking intermittent doses of aspirin We spent majority of the time reviewing test results and the patient was given copies of his test results  SUMMARY OF HEMATOLOGIC HISTORY: Joseph Fisher 56 y.o. male was last seen in 2016 He was originally seen in 2015 after presentation with leukopenia and GI bleed secondary to aspirin therapy He is doing well all these years He had coronary artery disease status post stent placement and was placed on antiplatelet agents and Vascepa Due to excessive bruising, he has discontinue aspirin 2 weeks ago He denies spontaneous bleeding He has peripheral neuropathy that bothers him from time to time He has lost 30 pounds of weight since he was started on semaglutide He denies over-the-counter supplements or NSAID Subsequent evaluation revealed dysfunctional platelet aggregation study secondary to NSAID/aspirin.  No other forms of bleeding disorder were found He was found to have vitamin B12 deficiency and he is recommended to take oral vitamin B12 supplement  I have reviewed the past medical history, past surgical history, social history and family history with the patient and they are unchanged from previous Fisher.  ALLERGIES:  has No Known Allergies.  MEDICATIONS:  Current Outpatient Medications  Medication Sig Dispense Refill   cyanocobalamin (VITAMIN B12) 1000 MCG tablet Take 1,000 mcg by mouth daily.     acetaminophen (TYLENOL) 325 MG tablet Take 650 mg by mouth every 6 (six) hours as needed for moderate pain.     ALPRAZolam (XANAX) 1 MG tablet Take 1 mg by mouth at bedtime as needed for anxiety.     amitriptyline (ELAVIL) 50 MG tablet Take 50-100 mg by mouth daily as needed.     amLODipine (NORVASC) 5 MG tablet Take  5 mg by mouth daily.     gabapentin (NEURONTIN) 300 MG capsule Take 600 mg by mouth 3 (three) times daily.     nebivolol (BYSTOLIC) 10 MG tablet Take 1 tablet (10 mg total) by mouth every evening. 30 tablet 0   nitroGLYCERIN (NITROSTAT) 0.4 MG SL tablet PLACE 1 TAB UNDER TONGUE EVERY 5 MINS AS NEEDED FOR CHEST PAIN. GO TO ER IS MORE THAN 2 TABS NEEDED (Patient taking differently: Place 0.4 mg under the tongue every 5 (five)  minutes as needed for chest pain.) 25 tablet 2   oxyCODONE-acetaminophen (PERCOCET) 10-325 MG tablet Take 1 tablet by mouth every 6 (six) hours as needed for pain.  0   ranolazine (RANEXA) 500 MG 12 hr tablet TAKE 1 TABLET BY MOUTH TWICE A DAY 180 tablet 1   rosuvastatin (CRESTOR) 40 MG tablet TAKE 1 TABLET BY MOUTH EVERYDAY AT BEDTIME 90 tablet 0   Semaglutide,0.25 or 0.'5MG'$ /DOS, (OZEMPIC, 0.25 OR 0.5 MG/DOSE,) 2 MG/3ML SOPN Inject 0.5 mg into the skin once a week.     valsartan-hydrochlorothiazide (DIOVAN-HCT) 320-12.5 MG tablet Take 1 tablet by mouth daily.     VASCEPA 1 g capsule TAKE 2 CAPSULES BY MOUTH TWICE A DAY 120 capsule 2   No current facility-administered medications for this visit.     REVIEW OF SYSTEMS:   Constitutional: Denies fevers, chills or night sweats Eyes: Denies blurriness of vision Ears, nose, mouth, throat, and face: Denies mucositis or sore throat Respiratory: Denies cough, dyspnea or wheezes Cardiovascular: Denies palpitation, chest discomfort or lower extremity swelling Gastrointestinal:  Denies nausea, heartburn or change in bowel habits Skin: Denies abnormal skin rashes Lymphatics: Denies new lymphadenopathy or easy bruising Neurological:Denies numbness, tingling or new weaknesses Behavioral/Psych: Mood is stable, no new changes  All other systems were reviewed with the patient and are negative.  PHYSICAL EXAMINATION: ECOG PERFORMANCE STATUS: 0 - Asymptomatic  Vitals:   09/10/22 1129  BP: 104/74  Pulse: 74  Resp: 18  Temp: 98.1 F (36.7 C)  SpO2: 98%   Filed Weights   09/10/22 1129  Weight: 198 lb 12.8 oz (90.2 kg)    GENERAL:alert, no distress and comfortable NEURO: alert & oriented x 3 with fluent speech, no focal motor/sensory deficits  LABORATORY DATA:  I have reviewed the data as listed     Component Value Date/Time   NA 140 08/23/2022 0858   NA 145 (H) 12/03/2021 1108   K 3.8 08/23/2022 0858   CL 104 08/23/2022 0858   CO2 29  08/23/2022 0858   GLUCOSE 100 (H) 08/23/2022 0858   BUN 16 08/23/2022 0858   BUN 11 12/03/2021 1108   CREATININE 1.26 (H) 08/23/2022 0858   CALCIUM 9.5 08/23/2022 0858   PROT 7.6 08/23/2022 0858   PROT 6.8 12/03/2021 1108   ALBUMIN 4.8 08/23/2022 0858   ALBUMIN 4.9 12/03/2021 1108   AST 13 (L) 08/23/2022 0858   ALT 15 08/23/2022 0858   ALKPHOS 36 (L) 08/23/2022 0858   BILITOT 0.5 08/23/2022 0858   GFRNONAA >60 08/23/2022 0858   GFRAA >60 09/29/2017 1402    No results found for: "SPEP", "UPEP"  Lab Results  Component Value Date   WBC 3.8 (L) 08/23/2022   NEUTROABS 1.7 08/23/2022   HGB 14.2 08/23/2022   HCT 40.7 08/23/2022   MCV 91.7 08/23/2022   PLT 158 08/23/2022      Chemistry      Component Value Date/Time   NA 140 08/23/2022 0858   NA  145 (H) 12/03/2021 1108   K 3.8 08/23/2022 0858   CL 104 08/23/2022 0858   CO2 29 08/23/2022 0858   BUN 16 08/23/2022 0858   BUN 11 12/03/2021 1108   CREATININE 1.26 (H) 08/23/2022 0858      Component Value Date/Time   CALCIUM 9.5 08/23/2022 0858   ALKPHOS 36 (L) 08/23/2022 0858   AST 13 (L) 08/23/2022 0858   ALT 15 08/23/2022 0858   BILITOT 0.5 08/23/2022 3419

## 2022-09-10 NOTE — Assessment & Plan Note (Signed)
He has vitamin B12 deficiency and this is the most likely cause of his leukopenia I recommend high-dose oral vitamin B12 supplement and I plan to recheck in 3 months If he has no improvement despite oral supplement, I will institute B12 injection

## 2022-09-10 NOTE — Assessment & Plan Note (Signed)
This is related to Vascepa as well as intermittent aspirin intake I have ran extensive tests and excluded diagnosis of hemophilia, liver disease and others His platelet aggregation study is consistent with recent aspirin intake Since he held [place] prior to blood work, his bruising disappeared I told the patient this is not a contraindication for him to continue on Vascepa, especially if it helps prevent heart disease He can resume aspirin

## 2022-09-23 ENCOUNTER — Telehealth: Payer: Self-pay

## 2022-09-23 NOTE — Telephone Encounter (Signed)
Patient called and said that he is still having bruising and thinks its from the vascepa. He does not want to continue this med, please advise.

## 2022-09-26 NOTE — Telephone Encounter (Signed)
Less likely.  If he stops Vascepa please inform him that his TAGs will go up; therefore, he needs to further reduce foods / drinks that increase TAGs.   Dr. Terri Skains

## 2022-09-27 NOTE — Telephone Encounter (Signed)
Patient aware.

## 2022-10-09 ENCOUNTER — Other Ambulatory Visit: Payer: Self-pay | Admitting: Cardiology

## 2022-10-09 DIAGNOSIS — I209 Angina pectoris, unspecified: Secondary | ICD-10-CM

## 2022-11-05 ENCOUNTER — Other Ambulatory Visit: Payer: Self-pay | Admitting: Cardiology

## 2022-11-06 ENCOUNTER — Other Ambulatory Visit: Payer: Self-pay | Admitting: Cardiology

## 2022-11-08 ENCOUNTER — Other Ambulatory Visit: Payer: Self-pay | Admitting: Cardiology

## 2022-12-03 ENCOUNTER — Other Ambulatory Visit: Payer: Self-pay | Admitting: Cardiology

## 2022-12-10 ENCOUNTER — Inpatient Hospital Stay: Payer: 59

## 2022-12-12 ENCOUNTER — Inpatient Hospital Stay: Payer: 59 | Admitting: Hematology and Oncology

## 2022-12-27 ENCOUNTER — Other Ambulatory Visit: Payer: Self-pay | Admitting: Cardiology

## 2022-12-27 DIAGNOSIS — I2510 Atherosclerotic heart disease of native coronary artery without angina pectoris: Secondary | ICD-10-CM

## 2022-12-30 ENCOUNTER — Telehealth: Payer: Self-pay | Admitting: Hematology and Oncology

## 2022-12-30 NOTE — Telephone Encounter (Signed)
Patient called to reschedule missed appointments. Patient rescheduled and notified.

## 2023-01-10 ENCOUNTER — Other Ambulatory Visit: Payer: Self-pay

## 2023-01-10 ENCOUNTER — Inpatient Hospital Stay: Payer: 59 | Attending: Hematology and Oncology

## 2023-01-10 DIAGNOSIS — G629 Polyneuropathy, unspecified: Secondary | ICD-10-CM | POA: Diagnosis not present

## 2023-01-10 DIAGNOSIS — I2510 Atherosclerotic heart disease of native coronary artery without angina pectoris: Secondary | ICD-10-CM | POA: Diagnosis not present

## 2023-01-10 DIAGNOSIS — R233 Spontaneous ecchymoses: Secondary | ICD-10-CM

## 2023-01-10 DIAGNOSIS — D72819 Decreased white blood cell count, unspecified: Secondary | ICD-10-CM | POA: Diagnosis present

## 2023-01-10 DIAGNOSIS — D696 Thrombocytopenia, unspecified: Secondary | ICD-10-CM | POA: Diagnosis not present

## 2023-01-10 DIAGNOSIS — E538 Deficiency of other specified B group vitamins: Secondary | ICD-10-CM | POA: Diagnosis not present

## 2023-01-10 DIAGNOSIS — Z955 Presence of coronary angioplasty implant and graft: Secondary | ICD-10-CM | POA: Insufficient documentation

## 2023-01-10 LAB — CBC WITH DIFFERENTIAL (CANCER CENTER ONLY)
Abs Immature Granulocytes: 0 10*3/uL (ref 0.00–0.07)
Basophils Absolute: 0 10*3/uL (ref 0.0–0.1)
Basophils Relative: 1 %
Eosinophils Absolute: 0.1 10*3/uL (ref 0.0–0.5)
Eosinophils Relative: 2 %
HCT: 39.6 % (ref 39.0–52.0)
Hemoglobin: 13.6 g/dL (ref 13.0–17.0)
Immature Granulocytes: 0 %
Lymphocytes Relative: 44 %
Lymphs Abs: 1.6 10*3/uL (ref 0.7–4.0)
MCH: 30.4 pg (ref 26.0–34.0)
MCHC: 34.3 g/dL (ref 30.0–36.0)
MCV: 88.4 fL (ref 80.0–100.0)
Monocytes Absolute: 0.3 10*3/uL (ref 0.1–1.0)
Monocytes Relative: 9 %
Neutro Abs: 1.5 10*3/uL — ABNORMAL LOW (ref 1.7–7.7)
Neutrophils Relative %: 44 %
Platelet Count: 126 10*3/uL — ABNORMAL LOW (ref 150–400)
RBC: 4.48 MIL/uL (ref 4.22–5.81)
RDW: 12.6 % (ref 11.5–15.5)
WBC Count: 3.5 10*3/uL — ABNORMAL LOW (ref 4.0–10.5)
nRBC: 0 % (ref 0.0–0.2)

## 2023-01-10 LAB — VITAMIN B12: Vitamin B-12: 220 pg/mL (ref 180–914)

## 2023-01-14 ENCOUNTER — Other Ambulatory Visit: Payer: Self-pay

## 2023-01-14 ENCOUNTER — Inpatient Hospital Stay (HOSPITAL_BASED_OUTPATIENT_CLINIC_OR_DEPARTMENT_OTHER): Payer: 59 | Admitting: Hematology and Oncology

## 2023-01-14 ENCOUNTER — Encounter: Payer: Self-pay | Admitting: Hematology and Oncology

## 2023-01-14 VITALS — BP 108/66 | HR 58 | Temp 97.7°F | Resp 18 | Ht 66.0 in | Wt 204.2 lb

## 2023-01-14 DIAGNOSIS — D696 Thrombocytopenia, unspecified: Secondary | ICD-10-CM

## 2023-01-14 DIAGNOSIS — E538 Deficiency of other specified B group vitamins: Secondary | ICD-10-CM

## 2023-01-14 DIAGNOSIS — R233 Spontaneous ecchymoses: Secondary | ICD-10-CM

## 2023-01-14 DIAGNOSIS — D72819 Decreased white blood cell count, unspecified: Secondary | ICD-10-CM

## 2023-01-14 NOTE — Assessment & Plan Note (Signed)
He has improvement with oral vitamin B12 replacement therapy He will continue the same I recommend recheck CBC and B12 level with primary care doctor in a few months

## 2023-01-14 NOTE — Progress Notes (Signed)
Joseph Fisher  Long, Scott, PA-C  ASSESSMENT & PLAN:  Easy bruising This was investigated before He denies recent bruising  Leukopenia He has chronic leukopenia for many years He is not symptomatic Observe only  Vitamin B12 deficiency He has improvement with oral vitamin B12 replacement therapy He will continue the same I recommend recheck CBC and B12 level with primary care doctor in a few months  Thrombocytopenia His platelet count fluctuate up and down This is likely benign in nature I suspect this could be due mild liver disease. I recommend observation only.    No orders of the defined types were placed in this encounter.   The total time spent in the appointment was 20 minutes encounter with patients including review of chart and various tests results, discussions about plan of care and coordination of care plan   All questions were answered. The patient knows to call the clinic with any problems, questions or concerns. No barriers to learning was detected.    [name], MD 3/19/20249:40 AM  INTERVAL HISTORY: Joseph Fisher 57 y.o. male returns for further evaluation and follow-up for history of easy bruising and leukopenia, thrombocytopenia and B12 deficiency He tolerated oral vitamin B12 level well He denies recent excessive bruising We spent majority of our time reviewing test results  SUMMARY OF HEMATOLOGIC HISTORY: Joseph Fisher 57 y.o. male was last seen in 2016 He was originally seen in 2015 after presentation with leukopenia and GI bleed secondary to aspirin therapy He is doing well all these years He had coronary artery disease status post stent placement and was placed on antiplatelet agents and Vascepa Due to excessive bruising, he has discontinue aspirin 2 weeks ago He denies spontaneous bleeding He has peripheral neuropathy that bothers him from time to time He has lost 30 pounds of weight since he was  started on semaglutide He denies over-the-counter supplements or NSAID Subsequent evaluation revealed dysfunctional platelet aggregation study secondary to NSAID/aspirin.  No other forms of bleeding disorder were found He was found to have vitamin B12 deficiency and he is recommended to take oral vitamin B12 supplement  I have reviewed the past medical history, past surgical history, social history and family history with the patient and they are unchanged from previous Fisher.  ALLERGIES:  has No Known Allergies.  MEDICATIONS:  Current Outpatient Medications  Medication Sig Dispense Refill   acetaminophen (TYLENOL) 325 MG tablet Take 650 mg by mouth every 6 (six) hours as needed for moderate pain.     ALPRAZolam (XANAX) 1 MG tablet Take 1 mg by mouth at bedtime as needed for anxiety.     amitriptyline (ELAVIL) 50 MG tablet Take 50-100 mg by mouth daily as needed.     amLODipine (NORVASC) 5 MG tablet Take 5 mg by mouth daily.     cyanocobalamin (VITAMIN B12) 1000 MCG tablet Take 1,000 mcg by mouth daily.     gabapentin (NEURONTIN) 300 MG capsule Take 600 mg by mouth 3 (three) times daily.     nebivolol (BYSTOLIC) 10 MG tablet Take 1 tablet (10 mg total) by mouth every evening. 30 tablet 0   nitroGLYCERIN (NITROSTAT) 0.4 MG SL tablet PLACE 1 TAB UNDER TONGUE EVERY 5 MINS AS NEEDED FOR CHEST PAIN. GO TO ER IS MORE THAN 2 TABS NEEDED (Patient taking differently: Place 0.4 mg under the tongue every 5 (five) minutes as needed for chest pain.) 25 tablet 2   oxyCODONE-acetaminophen (PERCOCET) 10-325 MG  tablet Take 1 tablet by mouth every 6 (six) hours as needed for pain.  0   ranolazine (RANEXA) 500 MG 12 hr tablet TAKE 1 TABLET BY MOUTH TWICE A DAY 180 tablet 1   rosuvastatin (CRESTOR) 40 MG tablet TAKE 1 TABLET BY MOUTH EVERYDAY AT BEDTIME 90 tablet 0   Semaglutide,0.25 or 0.5MG /DOS, (OZEMPIC, 0.25 OR 0.5 MG/DOSE,) 2 MG/3ML SOPN Inject 0.5 mg into the skin once a week.      valsartan-hydrochlorothiazide (DIOVAN-HCT) 320-12.5 MG tablet Take 1 tablet by mouth daily.     No current facility-administered medications for this visit.     REVIEW OF SYSTEMS:   Constitutional: Denies fevers, chills or night sweats Eyes: Denies blurriness of vision Ears, nose, mouth, throat, and face: Denies mucositis or sore throat Respiratory: Denies cough, dyspnea or wheezes Cardiovascular: Denies palpitation, chest discomfort or lower extremity swelling Gastrointestinal:  Denies nausea, heartburn or change in bowel habits Skin: Denies abnormal skin rashes Lymphatics: Denies new lymphadenopathy or easy bruising Neurological:Denies numbness, tingling or new weaknesses Behavioral/Psych: Mood is stable, no new changes  All other systems were reviewed with the patient and are negative.  PHYSICAL EXAMINATION: ECOG PERFORMANCE STATUS: 0 - Asymptomatic  Vitals:   01/14/23 0840  BP: 108/66  Pulse: (!) 58  Resp: 18  Temp: 97.7 F (36.5 C)  SpO2: 100%   Filed Weights   01/14/23 0840  Weight: 204 lb 3.2 oz (92.6 kg)    GENERAL:alert, no distress and comfortable  NEURO: alert & oriented x 3 with fluent speech, no focal motor/sensory deficits  LABORATORY DATA:  I have reviewed the data as listed     Component Value Date/Time   NA 140 08/23/2022 0858   NA 145 (H) 12/03/2021 1108   K 3.8 08/23/2022 0858   CL 104 08/23/2022 0858   CO2 29 08/23/2022 0858   GLUCOSE 100 (H) 08/23/2022 0858   BUN 16 08/23/2022 0858   BUN 11 12/03/2021 1108   CREATININE 1.26 (H) 08/23/2022 0858   CALCIUM 9.5 08/23/2022 0858   PROT 7.6 08/23/2022 0858   PROT 6.8 12/03/2021 1108   ALBUMIN 4.8 08/23/2022 0858   ALBUMIN 4.9 12/03/2021 1108   AST 13 (L) 08/23/2022 0858   ALT 15 08/23/2022 0858   ALKPHOS 36 (L) 08/23/2022 0858   BILITOT 0.5 08/23/2022 0858   GFRNONAA >60 08/23/2022 0858   GFRAA >60 09/29/2017 1402    No results found for: "SPEP", "UPEP"  Lab Results  Component Value  Date   WBC 3.5 (L) 01/10/2023   NEUTROABS 1.5 (L) 01/10/2023   HGB 13.6 01/10/2023   HCT 39.6 01/10/2023   MCV 88.4 01/10/2023   PLT 126 (L) 01/10/2023      Chemistry      Component Value Date/Time   NA 140 08/23/2022 0858   NA 145 (H) 12/03/2021 1108   K 3.8 08/23/2022 0858   CL 104 08/23/2022 0858   CO2 29 08/23/2022 0858   BUN 16 08/23/2022 0858   BUN 11 12/03/2021 1108   CREATININE 1.26 (H) 08/23/2022 0858      Component Value Date/Time   CALCIUM 9.5 08/23/2022 0858   ALKPHOS 36 (L) 08/23/2022 0858   AST 13 (L) 08/23/2022 0858   ALT 15 08/23/2022 0858   BILITOT 0.5 08/23/2022 [id number]

## 2023-01-14 NOTE — Assessment & Plan Note (Signed)
He has chronic leukopenia for many years He is not symptomatic Observe only

## 2023-01-14 NOTE — Assessment & Plan Note (Signed)
This was investigated before He denies recent bruising

## 2023-01-14 NOTE — Assessment & Plan Note (Signed)
His platelet count fluctuate up and down This is likely benign in nature I suspect this could be due mild liver disease. I recommend observation only.

## 2023-01-30 ENCOUNTER — Other Ambulatory Visit: Payer: Self-pay | Admitting: Cardiology

## 2023-01-30 DIAGNOSIS — I2510 Atherosclerotic heart disease of native coronary artery without angina pectoris: Secondary | ICD-10-CM

## 2023-02-17 ENCOUNTER — Other Ambulatory Visit: Payer: Self-pay | Admitting: Cardiology

## 2023-02-17 ENCOUNTER — Other Ambulatory Visit: Payer: Self-pay

## 2023-02-17 DIAGNOSIS — I2510 Atherosclerotic heart disease of native coronary artery without angina pectoris: Secondary | ICD-10-CM

## 2023-02-17 MED ORDER — VALSARTAN-HYDROCHLOROTHIAZIDE 320-12.5 MG PO TABS: 1.0000 | ORAL_TABLET | Freq: Every day | ORAL | 1 refills | Status: DC

## 2023-02-18 ENCOUNTER — Other Ambulatory Visit: Payer: Self-pay

## 2023-02-18 ENCOUNTER — Other Ambulatory Visit: Payer: Self-pay | Admitting: Cardiology

## 2023-02-18 MED ORDER — ICOSAPENT ETHYL 1 G PO CAPS: 2.0000 g | ORAL_CAPSULE | Freq: Two times a day (BID) | ORAL | 3 refills | Status: DC

## 2023-03-01 ENCOUNTER — Other Ambulatory Visit: Payer: Self-pay | Admitting: Cardiology

## 2023-03-01 DIAGNOSIS — I2510 Atherosclerotic heart disease of native coronary artery without angina pectoris: Secondary | ICD-10-CM

## 2023-03-13 ENCOUNTER — Other Ambulatory Visit: Payer: Self-pay | Admitting: Cardiology

## 2023-03-28 ENCOUNTER — Other Ambulatory Visit: Payer: Self-pay | Admitting: Cardiology

## 2023-04-01 ENCOUNTER — Other Ambulatory Visit: Payer: Self-pay | Admitting: Cardiology

## 2023-04-01 DIAGNOSIS — I209 Angina pectoris, unspecified: Secondary | ICD-10-CM

## 2023-04-14 ENCOUNTER — Other Ambulatory Visit: Payer: Self-pay | Admitting: Cardiology

## 2023-04-15 ENCOUNTER — Other Ambulatory Visit: Payer: Self-pay | Admitting: Cardiology

## 2023-04-16 ENCOUNTER — Other Ambulatory Visit: Payer: Self-pay | Admitting: Cardiology

## 2023-05-14 ENCOUNTER — Other Ambulatory Visit: Payer: Self-pay | Admitting: Cardiology

## 2023-05-14 DIAGNOSIS — I2510 Atherosclerotic heart disease of native coronary artery without angina pectoris: Secondary | ICD-10-CM

## 2023-05-14 DIAGNOSIS — Z955 Presence of coronary angioplasty implant and graft: Secondary | ICD-10-CM

## 2023-05-16 NOTE — Telephone Encounter (Signed)
Please advise 

## 2023-05-21 ENCOUNTER — Telehealth: Payer: Self-pay | Admitting: Hematology and Oncology

## 2023-05-26 ENCOUNTER — Encounter: Payer: Self-pay | Admitting: Cardiology

## 2023-05-26 ENCOUNTER — Ambulatory Visit: Payer: 59 | Admitting: Cardiology

## 2023-05-26 VITALS — BP 107/70 | HR 73 | Resp 16 | Ht 66.0 in | Wt 208.4 lb

## 2023-05-26 DIAGNOSIS — I10 Essential (primary) hypertension: Secondary | ICD-10-CM

## 2023-05-26 DIAGNOSIS — E782 Mixed hyperlipidemia: Secondary | ICD-10-CM

## 2023-05-26 DIAGNOSIS — G4733 Obstructive sleep apnea (adult) (pediatric): Secondary | ICD-10-CM

## 2023-05-26 DIAGNOSIS — Z955 Presence of coronary angioplasty implant and graft: Secondary | ICD-10-CM

## 2023-05-26 DIAGNOSIS — I2510 Atherosclerotic heart disease of native coronary artery without angina pectoris: Secondary | ICD-10-CM

## 2023-05-26 DIAGNOSIS — Z6833 Body mass index (BMI) 33.0-33.9, adult: Secondary | ICD-10-CM

## 2023-05-26 NOTE — Progress Notes (Signed)
Date:  05/26/2023   ID:  Joseph Fisher, DOB Jul 24, 1966, MRN [id number]  PCP:  Lindaann Pascal, PA-C  Cardiologist:  Tessa Lerner, DO, [facility] (established care 01/26/2021)  Date: 05/26/23 Last Office Visit: 05/24/2022  Chief Complaint  Patient presents with   Coronary Artery Disease   Follow-up    1 year    HPI  Joseph Fisher is a 57 y.o. male whose past medical history and cardiovascular risk factors include: CAD s/p PCI,  Hypertension, OSA not on CPAP, hyperlipidemia, obesity due to excess calories.  Referred to the practice in April 2022 for evaluation of chest pain symptoms concerning for unstable angina and left heart catheterization noted disease in the LAD underwent angioplasty and stenting.  He has done well post coronary intervention and the shared decision has been to improve his modifiable cardiovascular risk factors.  He now presents for 1 year follow-up visit.  Denies anginal chest pain or heart failure symptoms.  No use of sublingual nitroglycerin tablets since last office encounter.  Overall functional capacity remains stable.  Recently had labs with PCP which she provides for reference and noted below.  FUNCTIONAL STATUS: Walks at least 2 miles per day (5 days a week) and also some resistance training at the gym.  ALLERGIES: No Known Allergies  MEDICATION LIST PRIOR TO VISIT: Current Meds  Medication Sig   acetaminophen (TYLENOL) 325 MG tablet Take 650 mg by mouth every 6 (six) hours as needed for moderate pain.   ALPRAZolam (XANAX) 1 MG tablet Take 1 mg by mouth at bedtime as needed for anxiety.   amitriptyline (ELAVIL) 50 MG tablet Take 50-100 mg by mouth daily as needed.   amLODipine (NORVASC) 5 MG tablet Take 5 mg by mouth daily.   ASPIRIN LOW DOSE 81 MG tablet TAKE 1 TABLET (81 MG TOTAL) BY MOUTH DAILY. SWALLOW WHOLE.   gabapentin (NEURONTIN) 300 MG capsule Take 600 mg by mouth 3 (three) times daily.   nebivolol (BYSTOLIC) 10 MG tablet Take 1 tablet (10 mg total)  by mouth every evening.   nitroGLYCERIN (NITROSTAT) 0.4 MG SL tablet PLACE 1 TAB UNDER TONGUE EVERY 5 MINS AS NEEDED FOR CHEST PAIN. GO TO ER IS MORE THAN 2 TABS NEEDED (Patient taking differently: Place 0.4 mg under the tongue every 5 (five) minutes as needed for chest pain.)   oxyCODONE-acetaminophen (PERCOCET) 10-325 MG tablet Take 1 tablet by mouth every 6 (six) hours as needed for pain.   ranolazine (RANEXA) 500 MG 12 hr tablet TAKE 1 TABLET BY MOUTH TWICE A DAY   rosuvastatin (CRESTOR) 40 MG tablet TAKE 1 TABLET BY MOUTH EVERYDAY AT BEDTIME   Semaglutide,0.25 or 0.5MG /DOS, (OZEMPIC, 0.25 OR 0.5 MG/DOSE,) 2 MG/3ML SOPN Inject 0.5 mg into the skin once a week.   valsartan-hydrochlorothiazide (DIOVAN-HCT) 320-12.5 MG tablet Take 1 tablet by mouth daily.   VASCEPA 1 g capsule TAKE 2 CAPSULES BY MOUTH TWICE A DAY   [DISCONTINUED] clopidogrel (PLAVIX) 75 MG tablet TAKE 8 TABLETS ONLY ON FRIDAY (02/02/21) THEN START TAKING 1 TABLET DAILY 02/03/21     PAST MEDICAL HISTORY: Past Medical History:  Diagnosis Date   Anemia, unspecified 07/14/2014   Barrett's esophagus    Blood transfusion without reported diagnosis 05/2014   Cervical stenosis of spine    Chronic back pain    Coronary artery disease    Diverticulosis    External hemorrhoids    Gastric ulcer    GERD (gastroesophageal reflux disease)    GI bleed  Hemorrhagic shock (HCC)    Hyperlipidemia    Hypertension    Hypoxemia 06/20/2014   Insomnia    OSA (obstructive sleep apnea) 01/06/2019   Peptic ulcer    Pneumonia    RLS (restless legs syndrome) 06/20/2014   Snoring 06/20/2014   Unspecified deficiency anemia 07/14/2014    PAST SURGICAL HISTORY: Past Surgical History:  Procedure Laterality Date   ANTERIOR CERVICAL DECOMP/DISCECTOMY FUSION N/A 10/03/2017   Procedure: ACDF - C4-C5 - C5-C6 - C6-C7;  Surgeon: Julio Sicks, MD;  Location: [facility] OR;  Service: Neurosurgery;  Laterality: N/A;   BACK SURGERY  10/29/2011   CARDIAC  CATHETERIZATION     CORONARY IMAGING/OCT N/A 01/30/2021   Procedure: INTRAVASCULAR IMAGING/OCT;  Surgeon: Elder Negus, MD;  Location: [facility] INVASIVE CV LAB;  Service: Cardiovascular;  Laterality: N/A;   CORONARY STENT INTERVENTION N/A 01/30/2021   Procedure: CORONARY STENT INTERVENTION;  Surgeon: Elder Negus, MD;  Location: [facility] INVASIVE CV LAB;  Service: Cardiovascular;  Laterality: N/A;   ESOPHAGOGASTRODUODENOSCOPY N/A 06/04/2014   Procedure: ESOPHAGOGASTRODUODENOSCOPY (EGD);  Surgeon: Beverley Fiedler, MD;  Location[facility] ENDOSCOPY;  Service: Endoscopy;  Laterality: N/A;   LEFT HEART CATH AND CORONARY ANGIOGRAPHY N/A 01/30/2021   Procedure: LEFT HEART CATH AND CORONARY ANGIOGRAPHY;  Surgeon: Elder Negus, MD;  Location: [facility] INVASIVE CV LAB;  Service: Cardiovascular;  Laterality: N/A;    FAMILY HISTORY: The patient family history includes Hypertension in his father and mother.  SOCIAL HISTORY:  The patient  reports that he has never smoked. He has never used smokeless tobacco. He reports that he does not currently use alcohol. He reports that he does not use drugs.  REVIEW OF SYSTEMS: Review of Systems  Cardiovascular:  Negative for chest pain, claudication, dyspnea on exertion, irregular heartbeat, leg swelling, near-syncope, orthopnea, palpitations, paroxysmal nocturnal dyspnea and syncope.  Respiratory:  Negative for shortness of breath.   Hematologic/Lymphatic: Negative for bleeding problem.  Musculoskeletal:  Negative for muscle cramps and myalgias.  Neurological:  Negative for dizziness and light-headedness.    PHYSICAL EXAM:    05/26/2023    9:55 AM 01/14/2023    8:40 AM 09/10/2022   11:29 AM  Vitals with BMI  Height 5\' 6"  5\' 6"  5\' 6"   Weight 208 lbs 6 oz 204 lbs 3 oz 198 lbs 13 oz  BMI 33.65 32.97 32.1  Systolic 107 108 409  Diastolic 70 66 74  Pulse 73 58 74   Physical Exam  Constitutional: He appears healthy. No distress.  Neck: No JVD present.   Cardiovascular: Normal rate, regular rhythm, S1 normal, S2 normal, intact distal pulses and normal pulses. Exam reveals no gallop, no S3 and no S4.  No murmur heard. Pulmonary/Chest: Effort normal and breath sounds normal. No stridor. He has no wheezes. He has no rales.  Abdominal: Soft. Bowel sounds are normal. He exhibits no distension. There is no abdominal tenderness.  Musculoskeletal:        General: No edema.  Neurological: He is alert and oriented to person, place, and time.   CARDIAC DATABASE: EKG: May 26, 2023: Sinus rhythm, 67 bpm, frequent PACs, nonspecific T wave abnormality.  Echocardiogram: 03/06/2021:  Left ventricle cavity is normal in size and wall thickness. Normal global wall motion. Normal LV systolic function with EF 67%. Normal diastolic filling pattern.  No significant valvular abnormality.  Normal right atrial pressure.    Stress Testing: No results found for this or any previous visit from the past 1095 days.  Heart Catheterization: 01/30/2021: LM: Normal LAD: Proximal 90% stenosis         Diagonal 1 proximal 60% stenosis         Distal apical 90% stenosis in a small caliber vessel LCx: Normal RCA: Distal 20%, RPDA 20% stenosis  LABORATORY DATA:  External Labs: Collected: 01/13/2021, provided by PCP Hemoglobin 14.1 g/dL, hematocrit 29.5% Platelets 147 Creatinine 0.84 mg/dL. eGFR: 99 mL/min per 1.73 m Potassium 3.8 AST 21, ALT 27, alkaline phosphatase 53 Hemoglobin A1c: 6.1  External Labs: Collected: 07/09/2021 provided by the patient. Hemoglobin A1c 5.9. Hemoglobin 14.5 g/dL, hematocrit 62.1%. Platelets 150.  07/05/2021 total cholesterol 137, HDL 41, triglycerides 312, LDL 63, non-HDL 96   External Labs: Collected: February 21, 2023 provided by the patient. Hemoglobin A1c 5.4. Sodium 139, potassium 4.2, chloride 103, bicarb 21. BUN to creatinine ratio 16  AST 20, ALT 21, alkaline phosphatase 41 BUN 16 Total cholesterol 98, triglycerides  100, HDL 41, calculated LDL 38 TSH 1.93   IMPRESSION:    ICD-10-CM   1. Atherosclerosis of native coronary artery of native heart without angina pectoris  I25.10 EKG 12-Lead    PCV ECHOCARDIOGRAM COMPLETE    2. History of coronary angioplasty with insertion of stent  Z95.5 PCV ECHOCARDIOGRAM COMPLETE    3. Mixed hyperlipidemia  E78.2     4. Benign essential hypertension  I10     5. OSA (obstructive sleep apnea)  G47.33     6. Class 1 obesity due to excess calories with serious comorbidity and body mass index (BMI) of 33.0 to 33.9 in adult  E66.09    Z68.33        RECOMMENDATIONS: Joseph Fisher is a 57 y.o. male whose past medical history and cardiac risk factors include:  CAD s/p PCI,  Hypertension, OSA not on CPAP, hyperlipidemia, obesity due to excess calories.   Atherosclerosis of native coronary artery of native heart without angina pectoris History of coronary angioplasty with insertion of stent Denies anginal chest pain or heart failure symptoms. Status post PCI to the LAD in April 2022. EKG shows sinus rhythm with nonspecific T wave abnormality. Prior cardiac workup reviewed as part of medical decision making. Has been on dual antiplatelet therapy for more than a year. Shared decision is to discontinue Plavix. Outside labs provided by the patient independently reviewed-LDL is very well-controlled on current medical therapy. Reemphasized the importance of secondary prevention with focus on improving her modifiable cardiovascular risk factors such as glycemic control, lipid management, blood pressure control, weight loss. Plan to do echo as a 3-year follow-up study given his coronary disease -prior to next office visit.  Mixed hyperlipidemia Currently on rosuvastatin, Vascepa,.   He denies myalgia or other side effects. Most recent lipids dated April 2024, independently reviewed as noted above. LDL currently at goal.  Benign essential hypertension Office blood  pressures are very well-controlled. Since the weight loss I suspect that he will need less antihypertensive medications. I have asked him to keep a log of his blood pressures and to review with PCP -if overcorrected would wean off BP pills.  OSA (obstructive sleep apnea) Prefers not to use his CPAP. With the amount of weight loss I suspect that his severity of sleep apnea has reduced-still recommend reevaluation to see if device therapy is warranted.   Patient is aware of my recommendations. Will discuss w/ PCP  Class 1 obesity due to excess calories with serious comorbidity and body mass index (BMI) of 33.0 to 33.9 in  adult Body mass index is 33.64 kg/m. I reviewed with him importance of diet, regular physical activity/exercise, weight loss.   Patient is educated on the importance of increasing physical activity gradually as tolerated with a goal of moderate intensity exercise for 30 minutes a day 5 days a week.  FINAL MEDICATION LIST END OF ENCOUNTER: No orders of the defined types were placed in this encounter.   Medications Discontinued During This Encounter  Medication Reason   cyanocobalamin (VITAMIN B12) 1000 MCG tablet Patient Preference   clopidogrel (PLAVIX) 75 MG tablet      Current Outpatient Medications:    acetaminophen (TYLENOL) 325 MG tablet, Take 650 mg by mouth every 6 (six) hours as needed for moderate pain., Disp: , Rfl:    ALPRAZolam (XANAX) 1 MG tablet, Take 1 mg by mouth at bedtime as needed for anxiety., Disp: , Rfl:    amitriptyline (ELAVIL) 50 MG tablet, Take 50-100 mg by mouth daily as needed., Disp: , Rfl:    amLODipine (NORVASC) 5 MG tablet, Take 5 mg by mouth daily., Disp: , Rfl:    ASPIRIN LOW DOSE 81 MG tablet, TAKE 1 TABLET (81 MG TOTAL) BY MOUTH DAILY. SWALLOW WHOLE., Disp: 90 tablet, Rfl: 4   gabapentin (NEURONTIN) 300 MG capsule, Take 600 mg by mouth 3 (three) times daily., Disp: , Rfl:    nebivolol (BYSTOLIC) 10 MG tablet, Take 1 tablet (10 mg  total) by mouth every evening., Disp: 30 tablet, Rfl: 0   nitroGLYCERIN (NITROSTAT) 0.4 MG SL tablet, PLACE 1 TAB UNDER TONGUE EVERY 5 MINS AS NEEDED FOR CHEST PAIN. GO TO ER IS MORE THAN 2 TABS NEEDED (Patient taking differently: Place 0.4 mg under the tongue every 5 (five) minutes as needed for chest pain.), Disp: 25 tablet, Rfl: 2   oxyCODONE-acetaminophen (PERCOCET) 10-325 MG tablet, Take 1 tablet by mouth every 6 (six) hours as needed for pain., Disp: , Rfl: 0   ranolazine (RANEXA) 500 MG 12 hr tablet, TAKE 1 TABLET BY MOUTH TWICE A DAY, Disp: 180 tablet, Rfl: 1   rosuvastatin (CRESTOR) 40 MG tablet, TAKE 1 TABLET BY MOUTH EVERYDAY AT BEDTIME, Disp: 90 tablet, Rfl: 0   Semaglutide,0.25 or 0.5MG /DOS, (OZEMPIC, 0.25 OR 0.5 MG/DOSE,) 2 MG/3ML SOPN, Inject 0.5 mg into the skin once a week., Disp: , Rfl:    valsartan-hydrochlorothiazide (DIOVAN-HCT) 320-12.5 MG tablet, Take 1 tablet by mouth daily., Disp: 90 tablet, Rfl: 1   VASCEPA 1 g capsule, TAKE 2 CAPSULES BY MOUTH TWICE A DAY, Disp: 120 capsule, Rfl: 2  Orders Placed This Encounter  Procedures   EKG 12-Lead   PCV ECHOCARDIOGRAM COMPLETE    There are no Patient Instructions on file for this visit.   --Continue cardiac medications as reconciled in final medication list. --Return in about 1 year (around 05/25/2024) for Annual follow up visit, CAD. Or sooner if needed. --Continue follow-up with your primary care physician regarding the management of your other chronic comorbid conditions.  Patient's questions and concerns were addressed to his satisfaction. He voices understanding of the instructions provided during this encounter.   This note was created using a voice recognition software as a result there may be grammatical errors inadvertently enclosed that do not reflect the nature of this encounter. Every attempt is made to correct such errors.  Tessa Lerner, [place], [facility]  Pager:  [phone] Office: [phone]

## 2023-06-09 ENCOUNTER — Other Ambulatory Visit: Payer: Self-pay

## 2023-06-09 ENCOUNTER — Inpatient Hospital Stay (HOSPITAL_BASED_OUTPATIENT_CLINIC_OR_DEPARTMENT_OTHER): Payer: 59 | Admitting: Hematology and Oncology

## 2023-06-09 ENCOUNTER — Inpatient Hospital Stay: Payer: 59 | Attending: Hematology and Oncology

## 2023-06-09 ENCOUNTER — Other Ambulatory Visit: Payer: Self-pay | Admitting: Hematology and Oncology

## 2023-06-09 ENCOUNTER — Encounter: Payer: Self-pay | Admitting: Hematology and Oncology

## 2023-06-09 VITALS — BP 115/76 | HR 69 | Temp 98.0°F | Resp 18 | Ht 66.0 in | Wt 211.8 lb

## 2023-06-09 DIAGNOSIS — D696 Thrombocytopenia, unspecified: Secondary | ICD-10-CM

## 2023-06-09 DIAGNOSIS — E538 Deficiency of other specified B group vitamins: Secondary | ICD-10-CM

## 2023-06-09 DIAGNOSIS — D72819 Decreased white blood cell count, unspecified: Secondary | ICD-10-CM | POA: Insufficient documentation

## 2023-06-09 DIAGNOSIS — G629 Polyneuropathy, unspecified: Secondary | ICD-10-CM | POA: Diagnosis not present

## 2023-06-09 DIAGNOSIS — I2510 Atherosclerotic heart disease of native coronary artery without angina pectoris: Secondary | ICD-10-CM | POA: Diagnosis not present

## 2023-06-09 DIAGNOSIS — Z79899 Other long term (current) drug therapy: Secondary | ICD-10-CM | POA: Diagnosis not present

## 2023-06-09 DIAGNOSIS — Z7982 Long term (current) use of aspirin: Secondary | ICD-10-CM | POA: Insufficient documentation

## 2023-06-09 LAB — CBC WITH DIFFERENTIAL (CANCER CENTER ONLY)
Abs Immature Granulocytes: 0 10*3/uL (ref 0.00–0.07)
Basophils Absolute: 0 10*3/uL (ref 0.0–0.1)
Basophils Relative: 1 %
Eosinophils Absolute: 0.1 10*3/uL (ref 0.0–0.5)
Eosinophils Relative: 2 %
HCT: 38 % — ABNORMAL LOW (ref 39.0–52.0)
Hemoglobin: 13.3 g/dL (ref 13.0–17.0)
Immature Granulocytes: 0 %
Lymphocytes Relative: 50 %
Lymphs Abs: 1.6 10*3/uL (ref 0.7–4.0)
MCH: 30.4 pg (ref 26.0–34.0)
MCHC: 35 g/dL (ref 30.0–36.0)
MCV: 86.8 fL (ref 80.0–100.0)
Monocytes Absolute: 0.2 10*3/uL (ref 0.1–1.0)
Monocytes Relative: 7 %
Neutro Abs: 1.3 10*3/uL — ABNORMAL LOW (ref 1.7–7.7)
Neutrophils Relative %: 40 %
Platelet Count: 119 10*3/uL — ABNORMAL LOW (ref 150–400)
RBC: 4.38 MIL/uL (ref 4.22–5.81)
RDW: 12.5 % (ref 11.5–15.5)
WBC Count: 3.2 10*3/uL — ABNORMAL LOW (ref 4.0–10.5)
nRBC: 0 % (ref 0.0–0.2)

## 2023-06-09 NOTE — Assessment & Plan Note (Signed)
He has chronic The combined leukopenia and thrombocytopenia are likely due to sequestration from possible splenomegaly He has no signs of anemia Observe only

## 2023-06-09 NOTE — Assessment & Plan Note (Signed)
He has history of vitamin B12 deficiency and I suspect that was the cause of his neuropathy Since then, his B12 level has improved

## 2023-06-09 NOTE — Progress Notes (Signed)
[facility] OFFICE PROGRESS NOTE  Long, Scott, PA-C  ASSESSMENT & PLAN:  Leukopenia He has chronic The combined leukopenia and thrombocytopenia are likely due to sequestration from possible splenomegaly He has no signs of anemia Observe only  Thrombocytopenia He has history of thrombocytopenia I have reviewed prior imaging from 2015 and he has early signs of cirrhosis I suspect this is due to fatty liver He does not need treatment I will bring him back next year with plan for observation His recent bruising was evaluated extensively in the past and is due to Vascepa  Vitamin B12 deficiency He has history of vitamin B12 deficiency and I suspect that was the cause of his neuropathy Since then, his B12 level has improved  Orders Placed This Encounter  Procedures   CBC with Differential (Cancer Center Only)    Standing Status:   Future    Standing Expiration Date:   06/08/2024    The total time spent in the appointment was 20 minutes encounter with patients including review of chart and various tests results, discussions about plan of care and coordination of care plan   All questions were answered. The patient knows to call the clinic with any problems, questions or concerns. No barriers to learning was detected.    Artis Delay, MD 8/12/20241:01 PM  INTERVAL HISTORY: Joseph Fisher 57 y.o. 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 male was last seen in 2016 He  was originally seen in 2015 after presentation with leukopenia and GI bleed secondary to aspirin therapy He is doing well all these years He had coronary artery disease status post stent placement and was placed on antiplatelet agents and Vascepa Due to excessive bruising, he has discontinue aspirin 2 weeks ago He denies spontaneous bleeding He has peripheral neuropathy that bothers him from time to time He has lost 30 pounds of weight since he was started on semaglutide He denies over-the-counter supplements or NSAID Subsequent evaluation revealed dysfunctional platelet aggregation study secondary to NSAID/aspirin.  No other forms of bleeding disorder were found He was found to have vitamin B12 deficiency and he is recommended to take oral vitamin B12 supplement  I have reviewed the past medical history, past surgical history, social history and family history with the patient and they are unchanged from previous note.  ALLERGIES:  has No Known Allergies.  MEDICATIONS:  Current Outpatient Medications  Medication Sig Dispense Refill   acetaminophen (TYLENOL) 325 MG tablet Take 650 mg by mouth every 6 (six) hours as needed for moderate pain.     ALPRAZolam (XANAX) 1 MG tablet Take 1 mg by mouth at bedtime as needed for anxiety.     amitriptyline (ELAVIL) 50 MG tablet Take 50-100 mg by mouth daily as needed.     amLODipine (NORVASC) 5 MG tablet Take 5 mg by mouth daily.     ASPIRIN LOW DOSE 81 MG tablet TAKE 1 TABLET (81 MG TOTAL) BY MOUTH DAILY. SWALLOW  WHOLE. 90 tablet 4   gabapentin (NEURONTIN) 300 MG capsule Take 600 mg by mouth 3 (three) times daily.     nebivolol (BYSTOLIC) 10 MG tablet Take 1 tablet (10 mg total) by mouth every evening. 30 tablet 0   nitroGLYCERIN (NITROSTAT) 0.4 MG SL tablet PLACE 1 TAB UNDER TONGUE EVERY 5 MINS AS NEEDED FOR CHEST PAIN. GO TO ER IS MORE THAN 2 TABS NEEDED (Patient taking differently: Place 0.4 mg under the tongue every 5 (five) minutes as needed for  chest pain.) 25 tablet 2   oxyCODONE-acetaminophen (PERCOCET) 10-325 MG tablet Take 1 tablet by mouth every 6 (six) hours as needed for pain.  0   ranolazine (RANEXA) 500 MG 12 hr tablet TAKE 1 TABLET BY MOUTH TWICE A DAY 180 tablet 1   rosuvastatin (CRESTOR) 40 MG tablet TAKE 1 TABLET BY MOUTH EVERYDAY AT BEDTIME 90 tablet 0   Semaglutide,0.25 or 0.5MG /DOS, (OZEMPIC, 0.25 OR 0.5 MG/DOSE,) 2 MG/3ML SOPN Inject 0.5 mg into the skin once a week.     valsartan-hydrochlorothiazide (DIOVAN-HCT) 320-12.5 MG tablet Take 1 tablet by mouth daily. 90 tablet 1   VASCEPA 1 g capsule TAKE 2 CAPSULES BY MOUTH TWICE A DAY 120 capsule 2   No current facility-administered medications for this visit.     REVIEW OF SYSTEMS:   Constitutional: Denies fevers, chills or night sweats Eyes: Denies blurriness of vision Ears, nose, mouth, throat, and face: Denies mucositis or sore throat Respiratory: Denies cough, dyspnea or wheezes Cardiovascular: Denies palpitation, chest discomfort or lower extremity swelling Gastrointestinal:  Denies nausea, heartburn or change in bowel habits Lymphatics: Denies new lymphadenopathy Neurological:Denies numbness, tingling or new weaknesses Behavioral/Psych: Mood is stable, no new changes  All other systems were reviewed with the patient and are negative.  PHYSICAL EXAMINATION: ECOG PERFORMANCE STATUS: 0 - Asymptomatic  Vitals:   06/09/23 1210  BP: 115/76  Pulse: 69  Resp: 18  Temp: 98 F (36.7 C)  SpO2: 95%   Filed Weights   06/09/23 1210  Weight: 211 lb 12.8 oz (96.1 kg)    GENERAL:alert, no distress and comfortable SKIN: The patient have signs of sunburn NEURO: alert & oriented x 3 with fluent speech, no focal motor/sensory deficits  LABORATORY DATA:  I have reviewed the data as listed     Component Value Date/Time   NA 140 08/23/2022 0858   NA 145 (H) 12/03/2021 1108   K 3.8 08/23/2022 0858   CL 104 08/23/2022 0858   CO2 29 08/23/2022 0858   GLUCOSE  100 (H) 08/23/2022 0858   BUN 16 08/23/2022 0858   BUN 11 12/03/2021 1108   CREATININE 1.26 (H) 08/23/2022 0858   CALCIUM 9.5 08/23/2022 0858   PROT 7.6 08/23/2022 0858   PROT 6.8 12/03/2021 1108   ALBUMIN 4.8 08/23/2022 0858   ALBUMIN 4.9 12/03/2021 1108   AST 13 (L) 08/23/2022 0858   ALT 15 08/23/2022 0858   ALKPHOS 36 (L) 08/23/2022 0858   BILITOT 0.5 08/23/2022 0858   GFRNONAA >60 08/23/2022 0858   GFRAA >60 09/29/2017 1402    No results found for: "SPEP", "UPEP"  Lab Results  Component Value Date   WBC 3.2 (L) 06/09/2023   NEUTROABS 1.3 (L) 06/09/2023   HGB 13.3 06/09/2023   HCT 38.0 (L) 06/09/2023   MCV 86.8 06/09/2023   PLT 119 (L) 06/09/2023      Chemistry      Component Value Date/Time   NA 140 08/23/2022 0858   NA  145 (H) 12/03/2021 1108   K 3.8 08/23/2022 0858   CL 104 08/23/2022 0858   CO2 29 08/23/2022 0858   BUN 16 08/23/2022 0858   BUN 11 12/03/2021 1108   CREATININE 1.26 (H) 08/23/2022 0858      Component Value Date/Time   CALCIUM 9.5 08/23/2022 0858   ALKPHOS 36 (L) 08/23/2022 0858   AST 13 (L) 08/23/2022 0858   ALT 15 08/23/2022 0858   BILITOT 0.5 08/23/2022 4098

## 2023-06-09 NOTE — Assessment & Plan Note (Signed)
He has history of thrombocytopenia I have reviewed prior imaging from 2015 and he has early signs of cirrhosis I suspect this is due to fatty liver He does not need treatment I will bring him back next year with plan for observation His recent bruising was evaluated extensively in the past and is due to [facility]

## 2023-06-24 ENCOUNTER — Other Ambulatory Visit: Payer: Self-pay | Admitting: Physician Assistant

## 2023-06-24 ENCOUNTER — Other Ambulatory Visit: Payer: Self-pay | Admitting: Cardiology

## 2023-06-24 DIAGNOSIS — I2510 Atherosclerotic heart disease of native coronary artery without angina pectoris: Secondary | ICD-10-CM

## 2023-06-24 DIAGNOSIS — N5089 Other specified disorders of the male genital organs: Secondary | ICD-10-CM

## 2023-07-03 ENCOUNTER — Ambulatory Visit
Admission: RE | Admit: 2023-07-03 | Discharge: 2023-07-03 | Disposition: A | Discharge status: Home / Self Care | Payer: 59 | Source: Ambulatory Visit | Attending: Physician Assistant | Admitting: Physician Assistant

## 2023-07-03 DIAGNOSIS — N5089 Other specified disorders of the male genital organs: Secondary | ICD-10-CM

## 2023-09-05 ENCOUNTER — Other Ambulatory Visit (HOSPITAL_COMMUNITY): Payer: Self-pay

## 2023-09-24 ENCOUNTER — Institutional Professional Consult (permissible substitution): Payer: 59 | Admitting: Neurology

## 2023-10-04 ENCOUNTER — Other Ambulatory Visit: Payer: Self-pay | Admitting: Cardiology

## 2023-10-25 ENCOUNTER — Other Ambulatory Visit: Payer: Self-pay | Admitting: Cardiology

## 2023-10-28 ENCOUNTER — Other Ambulatory Visit: Payer: Self-pay | Admitting: Cardiology

## 2023-10-28 NOTE — Telephone Encounter (Signed)
Called pt, lm regarding the med refill. Last note from [place] states the pt is to consult pcp as well to consider weaning off of bp meds due to being controlled from weightless. Called pt to see if refill is needed.

## 2023-11-15 ENCOUNTER — Other Ambulatory Visit: Payer: Self-pay | Admitting: Cardiology

## 2023-11-15 DIAGNOSIS — I209 Angina pectoris, unspecified: Secondary | ICD-10-CM

## 2023-11-16 ENCOUNTER — Other Ambulatory Visit: Payer: Self-pay | Admitting: Cardiology

## 2023-11-16 DIAGNOSIS — I2510 Atherosclerotic heart disease of native coronary artery without angina pectoris: Secondary | ICD-10-CM

## 2024-04-19 ENCOUNTER — Other Ambulatory Visit: Payer: Self-pay | Admitting: Cardiology

## 2024-04-27 ENCOUNTER — Ambulatory Visit (HOSPITAL_COMMUNITY)
Admission: RE | Admit: 2024-04-27 | Discharge: 2024-04-27 | Disposition: A | Discharge status: Home / Self Care | Payer: 59 | Source: Ambulatory Visit | Attending: Cardiology | Admitting: Cardiology

## 2024-04-27 ENCOUNTER — Other Ambulatory Visit: Payer: 59

## 2024-04-27 DIAGNOSIS — Z955 Presence of coronary angioplasty implant and graft: Secondary | ICD-10-CM | POA: Diagnosis present

## 2024-04-27 DIAGNOSIS — I2510 Atherosclerotic heart disease of native coronary artery without angina pectoris: Secondary | ICD-10-CM | POA: Diagnosis present

## 2024-04-27 LAB — ECHOCARDIOGRAM COMPLETE
Area-P 1/2: 3.75 cm2
S' Lateral: 3.4 cm

## 2024-05-03 ENCOUNTER — Ambulatory Visit: Payer: Self-pay | Admitting: Cardiology

## 2024-05-08 ENCOUNTER — Other Ambulatory Visit: Payer: Self-pay | Admitting: Cardiology

## 2024-05-08 DIAGNOSIS — I2510 Atherosclerotic heart disease of native coronary artery without angina pectoris: Secondary | ICD-10-CM

## 2024-05-08 DIAGNOSIS — I209 Angina pectoris, unspecified: Secondary | ICD-10-CM

## 2024-05-26 ENCOUNTER — Ambulatory Visit: Attending: Cardiology | Admitting: Cardiology

## 2024-05-26 ENCOUNTER — Encounter: Payer: Self-pay | Admitting: Cardiology

## 2024-05-26 ENCOUNTER — Ambulatory Visit: Admitting: Cardiology

## 2024-05-26 VITALS — BP 121/77 | HR 55 | Resp 16 | Ht 66.0 in | Wt 206.2 lb

## 2024-05-26 DIAGNOSIS — Z955 Presence of coronary angioplasty implant and graft: Secondary | ICD-10-CM | POA: Diagnosis not present

## 2024-05-26 DIAGNOSIS — I10 Essential (primary) hypertension: Secondary | ICD-10-CM

## 2024-05-26 DIAGNOSIS — I2510 Atherosclerotic heart disease of native coronary artery without angina pectoris: Secondary | ICD-10-CM | POA: Diagnosis not present

## 2024-05-26 DIAGNOSIS — E782 Mixed hyperlipidemia: Secondary | ICD-10-CM

## 2024-05-26 DIAGNOSIS — E6609 Other obesity due to excess calories: Secondary | ICD-10-CM

## 2024-05-26 DIAGNOSIS — Z6833 Body mass index (BMI) 33.0-33.9, adult: Secondary | ICD-10-CM

## 2024-05-26 DIAGNOSIS — E66811 Obesity, class 1: Secondary | ICD-10-CM

## 2024-05-26 DIAGNOSIS — G4733 Obstructive sleep apnea (adult) (pediatric): Secondary | ICD-10-CM

## 2024-05-26 LAB — LIPID PANEL
Chol/HDL Ratio: 2.3 ratio (ref 0.0–5.0)
Cholesterol, Total: 96 mg/dL — ABNORMAL LOW (ref 100–199)
HDL: 42 mg/dL (ref >39)
LDL Chol Calc (NIH): 28 mg/dL (ref 0–99)
Triglycerides: 157 mg/dL — ABNORMAL HIGH (ref 0–149)
VLDL Cholesterol Cal: 26 mg/dL (ref 5–40)

## 2024-05-26 LAB — COMPREHENSIVE METABOLIC PANEL WITH GFR
ALT: 18 IU/L (ref 0–44)
AST: 11 IU/L (ref 0–40)
Albumin: 4.6 g/dL (ref 3.8–4.9)
Alkaline Phosphatase: 39 IU/L — ABNORMAL LOW (ref 44–121)
BUN/Creatinine Ratio: 15 (ref 9–20)
BUN: 14 mg/dL (ref 6–24)
Bilirubin Total: 0.5 mg/dL (ref 0.0–1.2)
CO2: 21 mmol/L (ref 20–29)
Calcium: 9.1 mg/dL (ref 8.7–10.2)
Chloride: 103 mmol/L (ref 96–106)
Creatinine, Ser: 0.94 mg/dL (ref 0.76–1.27)
Globulin, Total: 1.7 g/dL (ref 1.5–4.5)
Glucose: 105 mg/dL — ABNORMAL HIGH (ref 70–99)
Potassium: 4.3 mmol/L (ref 3.5–5.2)
Sodium: 138 mmol/L (ref 134–144)
Total Protein: 6.3 g/dL (ref 6.0–8.5)
eGFR: 95 mL/min/1.73 (ref >59)

## 2024-05-26 NOTE — Patient Instructions (Signed)
 Medication Instructions:  No medication changes were made at this visit. Continue current regimen.   *If you need a refill on your cardiac medications before your next appointment, please call your pharmacy*  Lab Work: To be completed today: FASTING lipid panel and CMP  If you have labs (blood work) drawn today and your tests are completely normal, you will receive your results only by: MyChart Message (if you have MyChart) OR A paper copy in the mail If you have any lab test that is abnormal or we need to change your treatment, we will call you to review the results.  Testing/Procedures: None ordered today.  Follow-Up: At [facility], you and your health needs are our priority.  As part of our continuing mission to provide you with exceptional heart care, our providers are all part of one team.  This team includes your primary Cardiologist (physician) and Advanced Practice Providers or APPs (Physician Assistants and Nurse Practitioners) who all work together to provide you with the care you need, when you need it.  Your next appointment:   1 year(s)  Provider:   Madonna Large, DO

## 2024-05-26 NOTE — Progress Notes (Signed)
 Cardiology Office Note:  .   Date:  05/26/2024  ID:  Joseph Fisher Brain, DOB Mar 22, 1966, MRN [id number] PCP:  [name]  Former Cardiology Providers: None [facility] Providers Cardiologist:  Madonna Large, DO , [facility] (established care 01/26/2021) Electrophysiologist:  None  Click to update primary MD,subspecialty MD or APP then REFRESH:1}    Chief Complaint  Patient presents with   Atherosclerosis of native coronary artery of native heart w   Follow-up    History of Present Illness: .   Joseph Fisher is a 58 y.o. Caucasian male whose past medical history and cardiovascular risk factors includes: CAD s/p PCI, Hypertension, OSA not on CPAP, hyperlipidemia, obesity due to excess calories.   In April 2022 patient presented to the office with chest pain concerning for unstable angina.  Left heart catheterization was arranged and was noted to have obstructive disease in the LAD underwent angioplasty and stenting.  Since then will be focusing on improving his modifiable cardiovascular risk factors.  He was last seen in the office in July 2020 for presents today for 1 year follow-up visit.  Since last office visit patient denies any anginal chest pain or heart failure symptoms.  No hospitalizations or urgent care visits for cardiovascular reasons.  He has been compliant with his medical therapy.  No significant weight gain.  Physical endurance remains stable, goes to gym three times a week (walks treadmill for 2-3 mile).    Review of Systems: .   Review of Systems  Cardiovascular:  Negative for chest pain, claudication, irregular heartbeat, leg swelling, near-syncope, orthopnea, palpitations, paroxysmal nocturnal dyspnea and syncope.  Respiratory:  Negative for shortness of breath.   Hematologic/Lymphatic: Negative for bleeding problem.    Studies Reviewed:   EKG: EKG Interpretation Date/Time:  Wednesday May 26 2024 08:27:23 EDT Ventricular Rate:  63 PR  Interval:  190 QRS Duration:  96 QT Interval:  396 QTC Calculation: 405 R Axis:   50  Text Interpretation: Sinus rhythm with Premature atrial complexes T wave inversion Inferior leads When compared with ECG of 30-Jan-2021 16:27, Premature atrial complexes are now Present Nonspecific T wave abnormality no longer evident in Anterior leads Confirmed by Large Madonna [phone]) on 05/26/2024 8:56:02 AM  Echocardiogram: 03/06/2021:  Left ventricle cavity is normal in size and wall thickness. Normal global wall motion. Normal LV systolic function with EF 67%. Normal diastolic filling pattern.  No significant valvular abnormality.  Normal right atrial pressure.   04/27/2024  1. Left ventricular ejection fraction, by estimation, is 65 to 70%. The left ventricle has normal function. The left ventricle has no regional wall motion abnormalities. Left ventricular diastolic parameters were normal.   2. Right ventricular systolic function is normal. The right ventricular size is mildly enlarged. There is normal pulmonary artery systolic pressure.   3. Left atrial size was moderately dilated.   4. The mitral valve is normal in structure. Trivial mitral valve regurgitation. No evidence of mitral stenosis.   5. The aortic valve is tricuspid. Aortic valve regurgitation is trivial. No aortic stenosis is present.   6. The inferior vena cava is normal in size with greater than 50% respiratory variability, suggesting right atrial pressure of 3 mmHg.   7. Cannot exclude a small PFO.   Heart Catheterization: 01/30/2021: LM: Normal LAD: Proximal 90% stenosis         Diagonal 1 proximal 60% stenosis         Distal apical 90% stenosis in a small caliber  vessel LCx: Normal RCA: Distal 20%, RPDA 20% stenosis  RADIOLOGY: NA  Risk Assessment/Calculations:   NA   Labs:    External Labs: Collected: 01/13/2021, provided by PCP Hemoglobin 14.1 g/dL, hematocrit 58.8% Platelets 147 Creatinine 0.84 mg/dL. eGFR: 99  mL/min per 1.73 m Potassium 3.8 AST 21, ALT 27, alkaline phosphatase 53 Hemoglobin A1c: 6.1   External Labs: Collected: 07/09/2021 provided by the patient. Hemoglobin A1c 5.9. Hemoglobin 14.5 g/dL, hematocrit 57.1%. Platelets 150.  07/05/2021 total cholesterol 137, HDL 41, triglycerides 312, LDL 63, non-HDL 96   Lab Results  Component Value Date   CHOL 103 12/03/2021   HDL 45 12/03/2021   LDLCALC 32 12/03/2021   LDLDIRECT 32 12/03/2021   TRIG 155 (H) 12/03/2021   External Labs: Collected: February 21, 2023 provided by the patient. Hemoglobin A1c 5.4. Sodium 139, potassium 4.2, chloride 103, bicarb 21. BUN to creatinine ratio 16  AST 20, ALT 21, alkaline phosphatase 41 BUN 16 Total cholesterol 98, triglycerides 100, HDL 41, calculated LDL 38 TSH 1.93  Physical Exam:    Today's Vitals   05/26/24 0824  BP: 121/77  Pulse: (!) 55  Resp: 16  SpO2: 96%  Weight: 206 lb 3.2 oz (93.5 kg)  Height: 5' 6 (1.676 m)   Body mass index is 33.28 kg/m. Wt Readings from Last 3 Encounters:  05/26/24 206 lb 3.2 oz (93.5 kg)  06/09/23 211 lb 12.8 oz (96.1 kg)  05/26/23 208 lb 6.4 oz (94.5 kg)    Physical Exam  Constitutional: He appears healthy. No distress.  Neck: No JVD present.  Cardiovascular: Regular rhythm, S1 normal, S2 normal, intact distal pulses and normal pulses. Bradycardia present. Exam reveals no gallop, no S3 and no S4.  No murmur heard. Pulmonary/Chest: Effort normal and breath sounds normal. No stridor. He has no wheezes. He has no rales.  Abdominal: Soft. Bowel sounds are normal. He exhibits no distension. There is no abdominal tenderness.  Musculoskeletal:        General: No edema.  Neurological: He is alert and oriented to person, place, and time.   Impression & Recommendation(s):  Impression:   ICD-10-CM   1. Atherosclerosis of native coronary artery of native heart without angina pectoris  I25.10 EKG 12-Lead    2. History of coronary angioplasty with  insertion of stent  Z95.5     3. Mixed hyperlipidemia  E78.2 Lipid panel    Comprehensive metabolic panel with GFR    Comprehensive metabolic panel with GFR    Lipid panel    4. Benign essential hypertension  I10     5. OSA (obstructive sleep apnea)  G47.33     6. Class 1 obesity due to excess calories with serious comorbidity and body mass index (BMI) of 33.0 to 33.9 in adult  E66.811    E66.09    Z68.33        Recommendation(s):  Atherosclerosis of native coronary artery of native heart without angina pectoris History of coronary angioplasty with insertion of stent History of unstable angina status post PCI to the LAD in April 2022 Denies anginal chest pain or heart failure symptoms. EKG does not illustrate active ischemia or injury pattern. Overall physical endurance remains relatively stable Antianginal therapies: Ranexa , Nebivolol  Continue aspirin  81 mg p.o. daily Continue Crestor  40 mg p.o. nightly. Continue Vascepa  1 g 2 tablets twice daily Sublingual nitroglycerin  tablets to use on as needed basis Echo from April 27, 2024 reviewed with the patient.  Mixed hyperlipidemia Currently on rosuvastatin   and Vascepa  Does not endorse any myalgias or side effects. Check fasting lipids and CMP  Benign essential hypertension Office blood pressures are well-controlled. Continue current medical therapy  OSA (obstructive sleep apnea) Has a diagnosis of sleep apnea but chooses not to be on CPAP.  Class 1 obesity due to excess calories with serious comorbidity and body mass index (BMI) of 33.0 to 33.9 in adult Body mass index is 33.28 kg/m. I reviewed with him importance of diet, regular physical activity/exercise, weight loss.   Patient is educated on the importance of increasing physical activity gradually as tolerated with a goal of moderate intensity exercise for 30 minutes a day 5 days a week.  Orders Placed:  Orders Placed This Encounter  Procedures   Lipid panel     Standing Status:   Future    Number of Occurrences:   1    Expected Date:   05/26/2024    Expiration Date:   05/26/2025   Comprehensive metabolic panel with GFR    Standing Status:   Future    Number of Occurrences:   1    Expected Date:   05/26/2024    Expiration Date:   05/26/2025   EKG 12-Lead     Final Medication List:   No orders of the defined types were placed in this encounter.   There are no discontinued medications.   Current Outpatient Medications:    acetaminophen  (TYLENOL ) 325 MG tablet, Take 650 mg by mouth every 6 (six) hours as needed for moderate pain., Disp: , Rfl:    ALPRAZolam  (XANAX ) 1 MG tablet, Take 1 mg by mouth at bedtime as needed for anxiety., Disp: , Rfl:    amitriptyline (ELAVIL) 50 MG tablet, Take 50-100 mg by mouth daily as needed., Disp: , Rfl:    amLODipine (NORVASC) 5 MG tablet, Take 5 mg by mouth daily., Disp: , Rfl:    ASPIRIN  LOW DOSE 81 MG tablet, TAKE 1 TABLET (81 MG TOTAL) BY MOUTH DAILY. SWALLOW WHOLE., Disp: 90 tablet, Rfl: 4   gabapentin (NEURONTIN) 300 MG capsule, Take 600 mg by mouth 3 (three) times daily., Disp: , Rfl:    icosapent  Ethyl (VASCEPA ) 1 g capsule, TAKE 2 CAPSULES BY MOUTH 2 TIMES DAILY., Disp: 120 capsule, Rfl: 6   nebivolol  (BYSTOLIC ) 10 MG tablet, Take 1 tablet (10 mg total) by mouth every evening., Disp: 30 tablet, Rfl: 0   nitroGLYCERIN  (NITROSTAT ) 0.4 MG SL tablet, PLACE 1 TAB UNDER TONGUE EVERY 5 MINS AS NEEDED FOR CHEST PAIN. GO TO ER IS MORE THAN 2 TABS NEEDED (Patient taking differently: Place 0.4 mg under the tongue every 5 (five) minutes as needed for chest pain.), Disp: 25 tablet, Rfl: 2   oxyCODONE -acetaminophen  (PERCOCET) 10-325 MG tablet, Take 1 tablet by mouth every 6 (six) hours as needed for pain., Disp: , Rfl: 0   ranolazine  (RANEXA ) 500 MG 12 hr tablet, TAKE 1 TABLET BY MOUTH TWICE A DAY, Disp: 180 tablet, Rfl: 0   rosuvastatin  (CRESTOR ) 40 MG tablet, TAKE 1 TABLET BY MOUTH EVERYDAY AT BEDTIME, Disp: 90 tablet,  Rfl: 0   Semaglutide,0.25 or 0.5MG /DOS, (OZEMPIC, 0.25 OR 0.5 MG/DOSE,) 2 MG/3ML SOPN, Inject 0.5 mg into the skin once a week., Disp: , Rfl:    valsartan -hydrochlorothiazide  (DIOVAN -HCT) 320-12.5 MG tablet, TAKE 1 TABLET BY MOUTH EVERY DAY, Disp: 90 tablet, Rfl: 0  Consent:   NA  Disposition:   1 year follow-up sooner if needed  His questions and concerns were addressed to  his satisfaction. He voices understanding of the recommendations provided during this encounter.    Signed, Madonna Michele HAS, [facility]  A Division of [facility] [address], [place], [place] [address]  [place], [place] [address] 05/26/2024 10:31 AM

## 2024-05-27 ENCOUNTER — Ambulatory Visit: Payer: 59 | Admitting: Cardiology

## 2024-05-27 ENCOUNTER — Ambulatory Visit: Payer: Self-pay | Admitting: Cardiology

## 2024-05-27 ENCOUNTER — Ambulatory Visit: Payer: Self-pay

## 2024-06-08 ENCOUNTER — Inpatient Hospital Stay: Payer: 59 | Attending: Hematology and Oncology

## 2024-06-08 ENCOUNTER — Encounter: Payer: Self-pay | Admitting: Hematology and Oncology

## 2024-06-08 ENCOUNTER — Inpatient Hospital Stay: Payer: 59 | Admitting: Hematology and Oncology

## 2024-07-15 ENCOUNTER — Other Ambulatory Visit: Payer: Self-pay | Admitting: Cardiology

## 2024-07-15 MED ORDER — VALSARTAN-HYDROCHLOROTHIAZIDE 320-12.5 MG PO TABS: 1.0000 | ORAL_TABLET | Freq: Every day | ORAL | 2 refills | Status: AC

## 2024-08-04 ENCOUNTER — Other Ambulatory Visit: Payer: Self-pay

## 2024-08-04 DIAGNOSIS — Z955 Presence of coronary angioplasty implant and graft: Secondary | ICD-10-CM

## 2024-08-04 DIAGNOSIS — I2510 Atherosclerotic heart disease of native coronary artery without angina pectoris: Secondary | ICD-10-CM

## 2024-08-05 MED ORDER — ASPIRIN 81 MG PO TBEC: 81.0000 mg | DELAYED_RELEASE_TABLET | Freq: Every day | ORAL | 4 refills | Status: DC

## 2024-08-06 ENCOUNTER — Other Ambulatory Visit: Payer: Self-pay | Admitting: Cardiology

## 2024-08-06 DIAGNOSIS — I2510 Atherosclerotic heart disease of native coronary artery without angina pectoris: Secondary | ICD-10-CM

## 2024-08-06 DIAGNOSIS — I209 Angina pectoris, unspecified: Secondary | ICD-10-CM

## 2024-09-30 ENCOUNTER — Other Ambulatory Visit: Payer: Self-pay | Admitting: Cardiology

## 2024-10-06 MED ORDER — ICOSAPENT ETHYL 1 G PO CAPS: 2.0000 g | ORAL_CAPSULE | Freq: Two times a day (BID) | ORAL | 2 refills | Status: AC

## 2024-11-09 ENCOUNTER — Other Ambulatory Visit: Payer: Self-pay | Admitting: Cardiology

## 2024-11-09 DIAGNOSIS — I2510 Atherosclerotic heart disease of native coronary artery without angina pectoris: Secondary | ICD-10-CM

## 2024-11-09 DIAGNOSIS — Z955 Presence of coronary angioplasty implant and graft: Secondary | ICD-10-CM

## 2024-11-10 NOTE — Telephone Encounter (Signed)
 Pt of Dr. Michele. At last O/V this was listed as a daily RX. On med list it's listed as a 5X Daily RX. Please address.
